# Patient Record
Sex: Male | Born: 1955 | Race: Black or African American | Hispanic: No | Marital: Single | Smoking: Current every day smoker
Health system: Southern US, Community
[De-identification: ages and names within clinical notes are randomized; demographics above are authoritative.]

## PROBLEM LIST (undated history)

## (undated) ENCOUNTER — Emergency Department (HOSPITAL_COMMUNITY): Admission: EM | Payer: Self-pay | Source: Home / Self Care

## (undated) DIAGNOSIS — F1010 Alcohol abuse, uncomplicated: Secondary | ICD-10-CM

## (undated) DIAGNOSIS — C220 Liver cell carcinoma: Secondary | ICD-10-CM

## (undated) DIAGNOSIS — I81 Portal vein thrombosis: Secondary | ICD-10-CM

## (undated) DIAGNOSIS — B1920 Unspecified viral hepatitis C without hepatic coma: Secondary | ICD-10-CM

## (undated) DIAGNOSIS — R160 Hepatomegaly, not elsewhere classified: Secondary | ICD-10-CM

## (undated) DIAGNOSIS — T07XXXA Unspecified multiple injuries, initial encounter: Secondary | ICD-10-CM

## (undated) DIAGNOSIS — D696 Thrombocytopenia, unspecified: Secondary | ICD-10-CM

## (undated) DIAGNOSIS — K7460 Unspecified cirrhosis of liver: Secondary | ICD-10-CM

## (undated) DIAGNOSIS — R251 Tremor, unspecified: Secondary | ICD-10-CM

## (undated) DIAGNOSIS — D61818 Other pancytopenia: Secondary | ICD-10-CM

## (undated) HISTORY — DX: Liver cell carcinoma: C22.0

## (undated) HISTORY — DX: Portal vein thrombosis: I81

## (undated) HISTORY — DX: Unspecified cirrhosis of liver: K74.60

## (undated) HISTORY — DX: Unspecified viral hepatitis C without hepatic coma: B19.20

## (undated) HISTORY — DX: Alcohol abuse, uncomplicated: F10.10

---

## 2004-08-31 DIAGNOSIS — K7460 Unspecified cirrhosis of liver: Secondary | ICD-10-CM

## 2004-08-31 HISTORY — DX: Unspecified cirrhosis of liver: K74.60

## 2004-12-25 ENCOUNTER — Inpatient Hospital Stay (HOSPITAL_COMMUNITY): Admission: EM | Admit: 2004-12-25 | Discharge: 2004-12-31 | Payer: Self-pay | Admitting: Emergency Medicine

## 2004-12-29 ENCOUNTER — Ambulatory Visit: Payer: Self-pay | Admitting: Internal Medicine

## 2005-05-28 ENCOUNTER — Emergency Department: Payer: Self-pay | Admitting: Unknown Physician Specialty

## 2005-11-26 ENCOUNTER — Other Ambulatory Visit: Payer: Self-pay

## 2005-11-27 ENCOUNTER — Observation Stay: Payer: Self-pay | Admitting: Internal Medicine

## 2005-12-02 ENCOUNTER — Other Ambulatory Visit: Payer: Self-pay

## 2005-12-02 ENCOUNTER — Inpatient Hospital Stay: Payer: Self-pay

## 2008-01-23 ENCOUNTER — Emergency Department (HOSPITAL_COMMUNITY): Admission: EM | Admit: 2008-01-23 | Discharge: 2008-01-24 | Payer: Self-pay | Admitting: Emergency Medicine

## 2010-08-31 DIAGNOSIS — I81 Portal vein thrombosis: Secondary | ICD-10-CM

## 2010-08-31 DIAGNOSIS — R160 Hepatomegaly, not elsewhere classified: Secondary | ICD-10-CM

## 2010-08-31 HISTORY — DX: Hepatomegaly, not elsewhere classified: R16.0

## 2010-08-31 HISTORY — DX: Portal vein thrombosis: I81

## 2010-11-06 ENCOUNTER — Emergency Department (HOSPITAL_COMMUNITY)
Admission: EM | Admit: 2010-11-06 | Discharge: 2010-11-07 | Disposition: A | Discharge status: Home / Self Care | Payer: Self-pay | Attending: Emergency Medicine | Admitting: Emergency Medicine

## 2010-11-06 DIAGNOSIS — Z8619 Personal history of other infectious and parasitic diseases: Secondary | ICD-10-CM | POA: Insufficient documentation

## 2010-11-06 DIAGNOSIS — F1010 Alcohol abuse, uncomplicated: Secondary | ICD-10-CM | POA: Insufficient documentation

## 2010-11-06 DIAGNOSIS — R259 Unspecified abnormal involuntary movements: Secondary | ICD-10-CM | POA: Insufficient documentation

## 2010-11-06 LAB — COMPREHENSIVE METABOLIC PANEL
ALT: 51 U/L (ref 0–53)
Albumin: 3.1 g/dL — ABNORMAL LOW (ref 3.5–5.2)
Alkaline Phosphatase: 76 U/L (ref 39–117)
BUN: 12 mg/dL (ref 6–23)
Chloride: 105 mEq/L (ref 96–112)
Creatinine, Ser: 0.86 mg/dL (ref 0.4–1.5)
GFR calc non Af Amer: >60 mL/min (ref >60)
Potassium: 3.7 mEq/L (ref 3.5–5.1)
Total Bilirubin: 1.4 mg/dL — ABNORMAL HIGH (ref 0.3–1.2)

## 2010-11-06 LAB — CBC
MCH: 35.5 pg — ABNORMAL HIGH (ref 26.0–34.0)
MCHC: 35.9 g/dL (ref 30.0–36.0)
MCV: 98.8 fL (ref 78.0–100.0)
Platelets: 78 10*3/uL — ABNORMAL LOW (ref 150–400)
RBC: 4.2 MIL/uL — ABNORMAL LOW (ref 4.22–5.81)
WBC: 4.4 10*3/uL (ref 4.0–10.5)

## 2010-11-06 LAB — RAPID URINE DRUG SCREEN, HOSP PERFORMED
Amphetamines: NOT DETECTED
Opiates: NOT DETECTED
Tetrahydrocannabinol: NOT DETECTED

## 2010-11-06 LAB — DIFFERENTIAL
Basophils Relative: 1 % (ref 0–1)
Eosinophils Relative: 1 % (ref 0–5)
Lymphocytes Relative: 64 % — ABNORMAL HIGH (ref 12–46)
Monocytes Absolute: 0.4 10*3/uL (ref 0.1–1.0)
Neutro Abs: 1.1 10*3/uL — ABNORMAL LOW (ref 1.7–7.7)

## 2010-11-09 ENCOUNTER — Emergency Department (HOSPITAL_COMMUNITY): Payer: Self-pay

## 2010-11-09 ENCOUNTER — Inpatient Hospital Stay (HOSPITAL_COMMUNITY): Payer: Self-pay

## 2010-11-09 ENCOUNTER — Inpatient Hospital Stay (HOSPITAL_COMMUNITY)
Admission: EM | Admit: 2010-11-09 | Discharge: 2010-11-11 | DRG: 897 | Disposition: A | Discharge status: Home / Self Care | Payer: Self-pay | Attending: Family Medicine | Admitting: Family Medicine

## 2010-11-09 DIAGNOSIS — Z9119 Patient's noncompliance with other medical treatment and regimen: Secondary | ICD-10-CM

## 2010-11-09 DIAGNOSIS — B1920 Unspecified viral hepatitis C without hepatic coma: Secondary | ICD-10-CM | POA: Diagnosis present

## 2010-11-09 DIAGNOSIS — K7030 Alcoholic cirrhosis of liver without ascites: Secondary | ICD-10-CM | POA: Diagnosis present

## 2010-11-09 DIAGNOSIS — Z91199 Patient's noncompliance with other medical treatment and regimen due to unspecified reason: Secondary | ICD-10-CM

## 2010-11-09 DIAGNOSIS — F10931 Alcohol use, unspecified with withdrawal delirium: Principal | ICD-10-CM | POA: Diagnosis present

## 2010-11-09 DIAGNOSIS — R7309 Other abnormal glucose: Secondary | ICD-10-CM | POA: Diagnosis present

## 2010-11-09 DIAGNOSIS — Z7982 Long term (current) use of aspirin: Secondary | ICD-10-CM

## 2010-11-09 DIAGNOSIS — F10231 Alcohol dependence with withdrawal delirium: Principal | ICD-10-CM | POA: Diagnosis present

## 2010-11-09 DIAGNOSIS — D696 Thrombocytopenia, unspecified: Secondary | ICD-10-CM | POA: Diagnosis present

## 2010-11-09 DIAGNOSIS — F1010 Alcohol abuse, uncomplicated: Secondary | ICD-10-CM | POA: Diagnosis present

## 2010-11-09 LAB — COMPREHENSIVE METABOLIC PANEL
AST: 121 U/L — ABNORMAL HIGH (ref 0–37)
Alkaline Phosphatase: 95 U/L (ref 39–117)
BUN: 10 mg/dL (ref 6–23)
CO2: 25 mEq/L (ref 19–32)
Chloride: 107 mEq/L (ref 96–112)
Creatinine, Ser: 0.77 mg/dL (ref 0.4–1.5)
GFR calc Af Amer: >60 mL/min (ref >60)
GFR calc non Af Amer: >60 mL/min (ref >60)
Glucose, Bld: 94 mg/dL (ref 70–99)
Total Protein: 8 g/dL (ref 6.0–8.3)

## 2010-11-09 LAB — CK TOTAL AND CKMB (NOT AT ARMC): Total CK: 54 U/L (ref 7–232)

## 2010-11-09 LAB — RAPID URINE DRUG SCREEN, HOSP PERFORMED
Amphetamines: NOT DETECTED
Cocaine: NOT DETECTED

## 2010-11-09 LAB — DIFFERENTIAL
Eosinophils Relative: 5 % (ref 0–5)
Monocytes Absolute: 0.3 10*3/uL (ref 0.1–1.0)
Neutro Abs: 0.8 10*3/uL — ABNORMAL LOW (ref 1.7–7.7)
Neutrophils Relative %: 25 % — ABNORMAL LOW (ref 43–77)

## 2010-11-09 LAB — CBC
HCT: 45.9 % (ref 39.0–52.0)
MCHC: 35.9 g/dL (ref 30.0–36.0)
WBC: 3 10*3/uL — ABNORMAL LOW (ref 4.0–10.5)

## 2010-11-09 LAB — AMMONIA: Ammonia: 90 umol/L — ABNORMAL HIGH (ref 11–35)

## 2010-11-10 LAB — BASIC METABOLIC PANEL
BUN: 11 mg/dL (ref 6–23)
CO2: 23 mEq/L (ref 19–32)
Glucose, Bld: 103 mg/dL — ABNORMAL HIGH (ref 70–99)
Potassium: 4 mEq/L (ref 3.5–5.1)
Sodium: 137 mEq/L (ref 135–145)

## 2010-11-10 LAB — CARDIAC PANEL(CRET KIN+CKTOT+MB+TROPI)
CK, MB: 1 ng/mL (ref 0.3–4.0)
Relative Index: INVALID (ref 0.0–2.5)
Total CK: 60 U/L (ref 7–232)
Total CK: 52 U/L (ref 7–232)
Troponin I: 0.03 ng/mL (ref 0.00–0.06)

## 2010-11-10 LAB — CBC
HCT: 40.3 % (ref 39.0–52.0)
MCH: 34.9 pg — ABNORMAL HIGH (ref 26.0–34.0)

## 2010-11-10 LAB — TSH: TSH: 1.431 u[IU]/mL (ref 0.350–4.500)

## 2010-11-11 LAB — CBC
HCT: 41.2 % (ref 39.0–52.0)
MCH: 35.7 pg — ABNORMAL HIGH (ref 26.0–34.0)
MCHC: 35.9 g/dL (ref 30.0–36.0)
MCV: 99.3 fL (ref 78.0–100.0)
RDW: 13.6 % (ref 11.5–15.5)

## 2010-11-11 LAB — HEMOGLOBIN A1C: Mean Plasma Glucose: 97 mg/dL (ref <117)

## 2010-11-13 NOTE — Discharge Summary (Signed)
NAMELADON, VANDENBERGHE NO.:  [id number]  MEDICAL RECORD NO.:  [id number]           PATIENT TYPE:  I  LOCATION:  2507                         FACILITY:  [facility]  PHYSICIAN:  Pleas Koch, MD        DATE OF BIRTH:  Sep 30, 1955  DATE OF ADMISSION:  11/09/2010 DATE OF DISCHARGE:  11/11/2010                              DISCHARGE SUMMARY   DISCHARGE DIAGNOSES: 1. Alcohol withdrawal. 2. Hepatitis C, plus/minus cirrhosis. 3. Thrombocytopenia and pancytopenia. 4. Impaired glucose tolerance. 5. Noncompliance medical therapy.  DISCHARGE MEDICATIONS: 1. Lactulose 30 mL t.i.d., hold for diarrhea. 2. Multivitamins 1 capsule daily.  The patient is now on home medications.  PERTINENT IMAGING STUDIES:  Chest x-ray, portable done on November 09, 2010, showed no acute cardiopulmonary process.  CT scan done on November 09, 2010, showed no intracranial hemorrhages, no evidence of large acute infarct, minimal paranasal sinus opacification as noted above.  The patient has no PCP.  The patient will need to establish [facility].  BRIEF HISTORY AND PHYSICAL:  The patient is a 55 year old man with hepatitis C, alcohol-related liver disease, alcohol abuse who presented from an outpatient detoxification center with worsening tremors and hallucinations.  In the ED, he received total of 3 mg of Ativan and somewhat somnolent, able to answer questions.  He received folic acid and thiamine in the ER.  He states last drink about 3 days ago.  He states the name of the facility he is currently at is [facility].  He was not able to give much history.  HOSPITAL COURSE: 1. According to history of alcohol withdrawal, the patient was placed     on CIWA protocol, and required minimum amount of Ativan.  He was     not having hallucinations.  He tolerated p.o. well.  He did not     have any tremulousness or heard any voices.  As such, he has     arrangements for outpatient followup [facility]     and I will allow him to go home today.  The patient was stable from     medical standpoint. 2. Hepatitis C and cirrhosis.  The patient has not had a workup for.     His HCV is unknown, what his titers are.  I did get HCV RNA which     is positive.  We are still awaiting HCV RNA quantitative and I am     not sure if we can take it from here, as it is too early to plan.     This will need to be followed up as an outpatient.  The patient     will need likely surveillance for esophageal varices given his     issues.  He also had an elevated ammonia level and this will need     to be reviewed.  The patient will need to continue on lactulose and     hold for diarrhea. 3. Thrombocytopenia and pancytopenia.  This is likely secondary to his     acute alcoholic state as well as his hepatitis.  He will need to  be     monitored for this periodically.  His platelet count ranged in the     hospital from 60s to 50s and he will need to have another CBC in     about 1-2 weeks. 4. Impaired glucose tolerance.  He had slightly high blood sugars     while in-hospital.  An A1c done and is still pending, however, the     patient will need repeat to determine further workup for this.  The     patient was discharged home in a stable state.  Temperature was 98.3, respirations were 20, pulse was 89, blood pressure 120s-130s/70s-80s.  I will contact his family to let them know of his discharge plan.          ______________________________ Pleas Koch, MD     JS/MEDQ  D:  11/11/2010  T:  11/11/2010  Job:  [id number]  Electronically Signed by Pleas Koch MD on 11/13/2010 04:51:36 PM

## 2010-11-15 NOTE — H&P (Signed)
NAME:  Jose Krueger, Jose Krueger NO.:  [id number]  MEDICAL RECORD NO.:  [id number]           PATIENT TYPE:  E  LOCATION:  [facility]                         FACILITY:  [facility]  PHYSICIAN:  Lamar Laundry, MD      DATE OF BIRTH:  Jan 06, 1956  DATE OF ADMISSION:  11/09/2010 DATE OF DISCHARGE:                             HISTORY & PHYSICAL   CHIEF COMPLAINT:  Alcohol withdrawal.  HISTORY OF PRESENT ILLNESS:  Jose Krueger is a 55 year old gentleman with a history of hepatitis C, alcohol-related liver disease, alcohol abuse who presents to the emergency room today from an outpatient detoxification center with worsening tremors and hallucinations.  In the ER, he received a total of 3 mg of p.o. Ativan and is currently somewhat somnolent at the time of my evaluation, but arousable and able to answer some questions.  He also received folic acid and thiamine in the ER.  He states his last drink was about 3 days ago.  The name of the facility he is currently at is [facility].  He is unable to give much further history at the present time.  ALLERGIES:  No known drug allergies.  MEDICATIONS:  He says he takes ibuprofen and aspirin on a p.r.n. basis.  PAST MEDICAL HISTORY:  Significant for alcohol abuse, hepatitis C, and hep C related liver disease, cocaine abuse, physiologic tremors, eczema, anxiety.  SOCIAL HISTORY:  He is participating in an alcohol rehabilitation center right now.  He does have a girlfriend who evidently discussed his situation at the ER doctor earlier, but she is not available at present to speak with.  He does have a history of cocaine abuse in the past, not clear if he is currently using drugs, but his UTOX here is negative.  FAMILY HISTORY:  The patient is quite somnolent, unable to obtain at this time.  REVIEW OF SYSTEMS:  A 10-point review of systems was unable to be obtained due to the patient's current state.  PHYSICAL EXAM:  VITAL SIGNS:  Temperature 98.1,  blood pressure 140/82, pulse 80, respirations 16, 98% on room air. GENERAL:  This is a middle-aged [place] American gentleman who is not in any current distress, currently lying in bed sleeping, but arousable. HEENT:  Normocephalic, atraumatic.  Extraocular movements are intact. Moist mucous membranes. NECK:  No jugular venous distention. CARDIOVASCULAR:  Regular rate and rhythm, S1 and S2.  No murmurs, rubs, or gallops. LUNGS:  Clear to auscultation bilaterally.  No crackles, wheezes, or rhonchi. ABDOMEN:  Soft, nontender, nondistended.  There is hepatomegaly appreciated. EXTREMITIES:  No pretibial edema. NEUROLOGIC:  Somnolent at present, but does not appear to have any focal deficits.  LABORATORY DATA:  Sodium 138, potassium 4, chloride 107, bicarb 25, BUN 10, creatinine 0.77, glucose 94.  Alk phos 121, AST 121, ALT 62, bilirubin 1.7, ammonia 90s with an upper limit normal of 35.  A UTOX is positive for only benzodiazepines, which he is likely being receiving at the outside facility, and an alcohol level less than 5.  A head CT was conducted and was negative for any acute findings.  ASSESSMENT AND PLAN:  This is a 55 year old gentleman with alcohol withdrawal and an elevated ammonia level. 1. Alcohol withdrawal.  We will place this patient on a CIWA protocol     and he will be receiving thiamine, folic acid, and multivitamin     daily in addition to p.r.n. Ativan. 2. Elevated ammonia level.  This may also be contributing to the     patient's altered mental status.  We will place him on lactulose 30     mL p.o. t.i.d. until discontinued and check an ammonia level again     tomorrow. 3. Polysubstance abuse.  We will get in contact with the social worker     regarding options for Jose Krueger in terms of discharge.  We are     currently going to treat his alcohol withdrawal.  His UTOX was     negative for other drug use at this time. 4. Prophylaxis.  We will place the patient on  subcutaneous Lovenox. 5. Code status.  The patient will be listed as a full code.     Lamar Laundry, MD     HR/MEDQ  D:  11/09/2010  T:  11/09/2010  Job:  [id number]  Electronically Signed by Lamar Laundry MD on 11/15/2010 01:34:44 PM

## 2010-12-30 HISTORY — PX: ESOPHAGOGASTRODUODENOSCOPY: SHX1529

## 2011-01-02 ENCOUNTER — Emergency Department (HOSPITAL_COMMUNITY): Payer: Self-pay

## 2011-01-02 ENCOUNTER — Inpatient Hospital Stay (INDEPENDENT_AMBULATORY_CARE_PROVIDER_SITE_OTHER)
Admission: RE | Admit: 2011-01-02 | Discharge: 2011-01-02 | Disposition: A | Discharge status: Home / Self Care | Payer: Self-pay | Source: Ambulatory Visit

## 2011-01-02 ENCOUNTER — Encounter: Payer: Self-pay | Admitting: Internal Medicine

## 2011-01-02 ENCOUNTER — Emergency Department (HOSPITAL_COMMUNITY): Admission: EM | Admit: 2011-01-02 | Payer: Self-pay | Source: Home / Self Care

## 2011-01-02 ENCOUNTER — Inpatient Hospital Stay (HOSPITAL_COMMUNITY)
Admission: EM | Admit: 2011-01-02 | Discharge: 2011-01-08 | DRG: 442 | Disposition: A | Discharge status: Home / Self Care | Payer: Self-pay | Attending: Internal Medicine | Admitting: Internal Medicine

## 2011-01-02 DIAGNOSIS — K766 Portal hypertension: Secondary | ICD-10-CM | POA: Diagnosis present

## 2011-01-02 DIAGNOSIS — M545 Low back pain, unspecified: Secondary | ICD-10-CM | POA: Diagnosis present

## 2011-01-02 DIAGNOSIS — I81 Portal vein thrombosis: Secondary | ICD-10-CM

## 2011-01-02 DIAGNOSIS — R791 Abnormal coagulation profile: Secondary | ICD-10-CM | POA: Diagnosis present

## 2011-01-02 DIAGNOSIS — R10819 Abdominal tenderness, unspecified site: Secondary | ICD-10-CM

## 2011-01-02 DIAGNOSIS — K7030 Alcoholic cirrhosis of liver without ascites: Secondary | ICD-10-CM | POA: Diagnosis present

## 2011-01-02 DIAGNOSIS — D696 Thrombocytopenia, unspecified: Secondary | ICD-10-CM | POA: Diagnosis present

## 2011-01-02 DIAGNOSIS — F17200 Nicotine dependence, unspecified, uncomplicated: Secondary | ICD-10-CM | POA: Diagnosis present

## 2011-01-02 DIAGNOSIS — B1920 Unspecified viral hepatitis C without hepatic coma: Secondary | ICD-10-CM | POA: Diagnosis present

## 2011-01-02 DIAGNOSIS — K7460 Unspecified cirrhosis of liver: Secondary | ICD-10-CM

## 2011-01-02 LAB — COMPREHENSIVE METABOLIC PANEL
ALT: 84 U/L — ABNORMAL HIGH (ref 0–53)
Alkaline Phosphatase: 67 U/L (ref 39–117)
CO2: 26 mEq/L (ref 19–32)
GFR calc non Af Amer: >60 mL/min (ref >60)
Glucose, Bld: 86 mg/dL (ref 70–99)
Potassium: 3.7 mEq/L (ref 3.5–5.1)
Sodium: 140 mEq/L (ref 135–145)

## 2011-01-02 LAB — DIFFERENTIAL
Basophils Absolute: 0.1 10*3/uL (ref 0.0–0.1)
Basophils Relative: 1 % (ref 0–1)
Eosinophils Absolute: 0.1 10*3/uL (ref 0.0–0.7)
Eosinophils Relative: 1 % (ref 0–5)
Lymphocytes Relative: 46 % (ref 12–46)
Lymphs Abs: 2.2 10*3/uL (ref 0.7–4.0)
Monocytes Absolute: 0.5 10*3/uL (ref 0.1–1.0)
Monocytes Relative: 10 % (ref 3–12)
Neutro Abs: 2.1 10*3/uL (ref 1.7–7.7)
Neutrophils Relative %: 42 % — ABNORMAL LOW (ref 43–77)

## 2011-01-02 LAB — CBC
HCT: 39.4 % (ref 39.0–52.0)
Hemoglobin: 13.7 g/dL (ref 13.0–17.0)
MCHC: 34.8 g/dL (ref 30.0–36.0)

## 2011-01-02 LAB — POCT URINALYSIS DIP (DEVICE)
Glucose, UA: NEGATIVE mg/dL
Hgb urine dipstick: NEGATIVE
Ketones, ur: NEGATIVE mg/dL
Nitrite: NEGATIVE
Specific Gravity, Urine: 1.015 (ref 1.005–1.030)

## 2011-01-02 LAB — LIPASE, BLOOD: Lipase: 36 U/L (ref 11–59)

## 2011-01-02 NOTE — H&P (Signed)
Hospital Admission Note Date: 01/02/2011  Patient name: Jose Krueger Medical record number: [id number] Date of birth: [birth year] Age: 55 y.o. Gender: male PCP: No primary provider on file.  Medical Service:  Attending physician:Dr. Phillips Odor      Resident (R2/R3):     Dr. Baltazar Apo    Pager: [phone] Resident (R1):          Dr. Dorthula Rue    Pager: [phone]  Chief Complaint: abdominal pain and  nausea   History of Present Illness: 55 y/o man resident of [facility] for 2 months with PMH significant for alcohol abuse, Hep C and Cirrhosis presented to the ER with chief complaint of abdominal pain and nausea  for couple of days.    His abdominal pain is located in the RUQ and epigastric region, intermittent in nature, describes his pain as sharp in character , 4/10 in severity, with no radiation. His abdominal pain is associated with some nausea and diarrhea. His describes his stools as loose but non bloody and non mucoid. He also reports some flu like symptoms- malaise, chills but denies any fever/cough/ vomiting/dysuria/urgency frequency. He was recently discharged from the hospital in 03/12 for alcohol withdrawal and has been staying off from alcohol in [facility] for last 2 months.   Allergies: NKDA  PAST MEDICAL HISTORY:  1. Alcohol withdrawal.   2. Hepatitis C (Viral Load-3140000- 03/12 )  3. Thrombocytopenia and pancytopenia.   4. Impaired glucose tolerance.   5. Noncompliance medical therapy.   SOCIAL HISTORY:  He is participating in an alcohol rehabilitation center right now.  He is married. He has a history of drug abuse in the past-cocaine  -  10-15 years ago, heroin abuse- 30 years ago. He quit drinking and smoking 2 months ago. Before that he used to drink about 6 pack beer/day for several months and then cut it down to 40 oz per day. He used to smoke about 1 pack per week for 20 years. 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  FAMILY HISTORY: non contributory   Review of  Systems: Pertinent items are noted in HPI.  Physical Exam: T-98.5, R-15, HR-72, BP-154/79, O2 sats- 100% on RA  General appearance: alert, cooperative, fatigued and no distress Lungs: clear to auscultation bilaterally Heart: regular rate and rhythm, S1, S2 normal, no murmur, click, rub or gallop Abdomen: soft, tender to palpation in the RUQ and epigastric region, voluntary guarding was present but no rigidity, positive bowel sounds Extremities: extremities normal, atraumatic, no cyanosis or edema Pulses: 2+ and symmetric Skin: normal , no rashes, lesions Neurologic: Grossly normal  Lab results: CBC:    Component Value Date/Time   WBC 4.9 01/02/2011 1253   HGB 13.7 01/02/2011 1253   HCT 39.4 01/02/2011 1253   PLT 89* 01/02/2011 1253   MCV 97.8 01/02/2011 1253   NEUTROABS 2.1 01/02/2011 1253   LYMPHSABS 2.2 01/02/2011 1253   MONOABS 0.5 01/02/2011 1253   EOSABS 0.1 01/02/2011 1253   BASOSABS 0.1 01/02/2011 1253   Comprehensive Metabolic Panel:    Component Value Date/Time   NA 140 01/02/2011 1253   K 3.7 01/02/2011 1253   CL 108 01/02/2011 1253   CO2 26 01/02/2011 1253   BUN 13 01/02/2011 1253   CREATININE 0.57 01/02/2011 1253   GLUCOSE 86 01/02/2011 1253   CALCIUM 9.0 01/02/2011 1253   AST 122* 01/02/2011 1253   ALT 84* 01/02/2011 1253   ALKPHOS 67 01/02/2011 1253   BILITOT 1.3* 01/02/2011  1253   PROT 6.8 01/02/2011 1253   ALBUMIN 2.5* 01/02/2011 1253     Lipase                                   36                11-59            U/L  Troponin I                               <0.30             <0.30            Ng/mL  Specific Gravity                         1.015             1.005-1.030  pH                                       7.0               5.0-8.0  Urine Glucose                            NEGATIVE          NEG              mg/dL  Bilirubin                                SMALL      a      NEG  Ketones                                  NEGATIVE          NEG              mg/dL  Blood                                     NEGATIVE          NEG  Protein                                  NEGATIVE          NEG              mg/dL  Urobilinogen                             >=8.0             0.0-1.0          mg/dL  Nitrite                                  NEGATIVE  NEG  Leukocytes                               NEGATIVE          NEG   Imaging results:  Ultrasound:  Heterogeneous and nodular liver is suggestive for cirrhosis. 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   Evidence for collateral veins or varices in the porta hepatis  Other results:  Assessment & Plan by Problem:  1. Abdominal pain: Differentials include gastritis (given the history of NSAIDS use and alcohol ) versus portal vein thrombosis versus GERD versus pancreatitis versus cholecystitis. He had abdominal ultrasound which did not show any evidence for cholecystitis but did show occluded portal vein and cirrhosis. His lipase levels were 36 which ruled out pancreatitis. This abdominal pain is most likely related to portal vein thrombosis but gastritis is also possible.  Plan: -Will check UDS, HIV given his history of substance abuse. -Will start him on heparin and warfarin, per Dr. Haywood Pao (GI) recommendations. There is concern for variceal bleeding, however given current thrombosis, will start patient on anticoagulation for goal INR 2-3 -Will treat his symptoms of pain and nausea with morphine and zofran. -Will start him on protonix -Will follow recommendations by Dr. Elnoria Howard of GI for further evaluation and EGD for screening of varices and gastritis   2. Cirrhosis: secondary to Hep C and alcohol abuse. Elevated LFTs reflect chronic alcohol use. Currently no evidence of hepatic encephalopathy. U/S consistent with cirrhosis but no ascites. Clinically patient's abdomen does not appear distended. Will continue to follow closely.   3. Thrombocytopenia: likely secondary to underlying liver disease. Plan: -Continue to monitor.  4. Hx of  Alcohol abuse - has not had any alcohol in over 2 months. Will follow closely and monitor for alcohol withdrawal.   5. Hepatitis C - now complicated by cirrhosis, with elevated viral load. Not seen by hepatologist in the past, and has never received any treatment in the past. Given degree of portal HTN, and cirrhosis, patient would not likely benefit from anti-viral therapy. MELD score not calculated as there is no INR yet.   6. VTE Prophylaxis: Heparin and coumadin

## 2011-01-03 ENCOUNTER — Inpatient Hospital Stay (HOSPITAL_COMMUNITY): Payer: Self-pay

## 2011-01-03 LAB — RAPID URINE DRUG SCREEN, HOSP PERFORMED
Benzodiazepines: NOT DETECTED
Cocaine: NOT DETECTED
Tetrahydrocannabinol: NOT DETECTED

## 2011-01-03 LAB — CBC
Platelets: 91 10*3/uL — ABNORMAL LOW (ref 150–400)
RDW: 14.4 % (ref 11.5–15.5)
WBC: 5.3 10*3/uL (ref 4.0–10.5)

## 2011-01-03 LAB — COMPREHENSIVE METABOLIC PANEL
Albumin: 2.2 g/dL — ABNORMAL LOW (ref 3.5–5.2)
BUN: 11 mg/dL (ref 6–23)
Chloride: 105 mEq/L (ref 96–112)
Creatinine, Ser: 0.61 mg/dL (ref 0.4–1.5)
Total Bilirubin: 0.8 mg/dL (ref 0.3–1.2)
Total Protein: 6.4 g/dL (ref 6.0–8.3)

## 2011-01-03 LAB — HIV ANTIBODY (ROUTINE TESTING W REFLEX): HIV: NONREACTIVE

## 2011-01-03 LAB — PROTIME-INR: INR: 1.24 (ref 0.00–1.49)

## 2011-01-03 LAB — HEPARIN LEVEL (UNFRACTIONATED): Heparin Unfractionated: 0.58 IU/mL (ref 0.30–0.70)

## 2011-01-03 MED ORDER — IOHEXOL 300 MG/ML  SOLN
100.0000 mL | Freq: Once | INTRAMUSCULAR | Status: AC | PRN
  Administered 2011-01-03: 100 mL via INTRAVENOUS

## 2011-01-04 ENCOUNTER — Inpatient Hospital Stay (HOSPITAL_COMMUNITY): Payer: Self-pay

## 2011-01-04 LAB — CBC
Hemoglobin: 12 g/dL — ABNORMAL LOW (ref 13.0–17.0)
MCHC: 35.1 g/dL (ref 30.0–36.0)
Platelets: 95 10*3/uL — ABNORMAL LOW (ref 150–400)
RBC: 3.52 MIL/uL — ABNORMAL LOW (ref 4.22–5.81)

## 2011-01-04 LAB — AFP TUMOR MARKER: AFP-Tumor Marker: 6.6 ng/mL (ref 0.0–8.0)

## 2011-01-04 LAB — BASIC METABOLIC PANEL
CO2: 26 mEq/L (ref 19–32)
Calcium: 8.6 mg/dL (ref 8.4–10.5)
GFR calc Af Amer: >60 mL/min (ref >60)
Sodium: 134 mEq/L — ABNORMAL LOW (ref 135–145)

## 2011-01-04 LAB — PROTIME-INR: INR: 2.18 — ABNORMAL HIGH (ref 0.00–1.49)

## 2011-01-04 LAB — HEPARIN LEVEL (UNFRACTIONATED): Heparin Unfractionated: 0.6 IU/mL (ref 0.30–0.70)

## 2011-01-04 MED ORDER — GADOXETATE DISODIUM 0.25 MMOL/ML IV SOLN
8.0000 mL | Freq: Once | INTRAVENOUS | Status: AC | PRN
  Administered 2011-01-04: 8 mL via INTRAVENOUS

## 2011-01-05 DIAGNOSIS — K7460 Unspecified cirrhosis of liver: Secondary | ICD-10-CM

## 2011-01-05 DIAGNOSIS — R932 Abnormal findings on diagnostic imaging of liver and biliary tract: Secondary | ICD-10-CM

## 2011-01-05 LAB — CBC
MCV: 97.4 fL (ref 78.0–100.0)
Platelets: 85 10*3/uL — ABNORMAL LOW (ref 150–400)
RBC: 3.48 MIL/uL — ABNORMAL LOW (ref 4.22–5.81)
WBC: 4.7 10*3/uL (ref 4.0–10.5)

## 2011-01-05 LAB — COMPREHENSIVE METABOLIC PANEL WITH GFR
ALT: 84 U/L — ABNORMAL HIGH (ref 0–53)
AST: 141 U/L — ABNORMAL HIGH (ref 0–37)
Albumin: 1.9 g/dL — ABNORMAL LOW (ref 3.5–5.2)
Alkaline Phosphatase: 65 U/L (ref 39–117)
BUN: 12 mg/dL (ref 6–23)
CO2: 26 meq/L (ref 19–32)
Calcium: 8.8 mg/dL (ref 8.4–10.5)
Chloride: 105 meq/L (ref 96–112)
Creatinine, Ser: 0.57 mg/dL (ref 0.4–1.5)
GFR calc non Af Amer: >60 mL/min
Glucose, Bld: 99 mg/dL (ref 70–99)
Potassium: 4.2 meq/L (ref 3.5–5.1)
Sodium: 134 meq/L — ABNORMAL LOW (ref 135–145)
Total Bilirubin: 0.5 mg/dL (ref 0.3–1.2)
Total Protein: 5.9 g/dL — ABNORMAL LOW (ref 6.0–8.3)

## 2011-01-05 LAB — PROTIME-INR
INR: 4.1 — ABNORMAL HIGH (ref 0.00–1.49)
Prothrombin Time: 39.7 s — ABNORMAL HIGH (ref 11.6–15.2)

## 2011-01-05 LAB — HEPARIN LEVEL (UNFRACTIONATED)

## 2011-01-06 DIAGNOSIS — K7460 Unspecified cirrhosis of liver: Secondary | ICD-10-CM

## 2011-01-06 DIAGNOSIS — I81 Portal vein thrombosis: Secondary | ICD-10-CM

## 2011-01-06 DIAGNOSIS — R932 Abnormal findings on diagnostic imaging of liver and biliary tract: Secondary | ICD-10-CM

## 2011-01-06 LAB — CBC
HCT: 35.8 % — ABNORMAL LOW (ref 39.0–52.0)
Hemoglobin: 12.2 g/dL — ABNORMAL LOW (ref 13.0–17.0)
MCH: 33.2 pg (ref 26.0–34.0)
MCHC: 34.1 g/dL (ref 30.0–36.0)
RBC: 3.68 MIL/uL — ABNORMAL LOW (ref 4.22–5.81)

## 2011-01-07 ENCOUNTER — Inpatient Hospital Stay (HOSPITAL_COMMUNITY): Payer: Self-pay

## 2011-01-07 ENCOUNTER — Other Ambulatory Visit (HOSPITAL_COMMUNITY): Payer: Self-pay

## 2011-01-07 LAB — BASIC METABOLIC PANEL
BUN: 9 mg/dL (ref 6–23)
Chloride: 103 mEq/L (ref 96–112)
GFR calc non Af Amer: >60 mL/min (ref >60)
Glucose, Bld: 84 mg/dL (ref 70–99)
Potassium: 4.5 mEq/L (ref 3.5–5.1)
Sodium: 135 mEq/L (ref 135–145)

## 2011-01-07 LAB — CBC
HCT: 35.2 % — ABNORMAL LOW (ref 39.0–52.0)
Hemoglobin: 12 g/dL — ABNORMAL LOW (ref 13.0–17.0)
MCH: 33.2 pg (ref 26.0–34.0)
MCHC: 34.1 g/dL (ref 30.0–36.0)
RBC: 3.61 MIL/uL — ABNORMAL LOW (ref 4.22–5.81)

## 2011-01-07 LAB — PROTIME-INR: INR: 1.53 — ABNORMAL HIGH (ref 0.00–1.49)

## 2011-01-08 DIAGNOSIS — I81 Portal vein thrombosis: Secondary | ICD-10-CM

## 2011-01-08 DIAGNOSIS — K7460 Unspecified cirrhosis of liver: Secondary | ICD-10-CM

## 2011-01-08 LAB — CBC
HCT: 34.6 % — ABNORMAL LOW (ref 39.0–52.0)
Hemoglobin: 11.9 g/dL — ABNORMAL LOW (ref 13.0–17.0)
MCV: 97.7 fL (ref 78.0–100.0)
RDW: 14.2 % (ref 11.5–15.5)
WBC: 3.6 10*3/uL — ABNORMAL LOW (ref 4.0–10.5)

## 2011-01-08 LAB — PROTIME-INR: INR: 1.31 (ref 0.00–1.49)

## 2011-01-08 LAB — HCV RNA QUANT RFLX ULTRA OR GENOTYP: HCV Quantitative Log: 6.18 {Log} — ABNORMAL HIGH (ref <1.63)

## 2011-01-14 NOTE — Progress Notes (Signed)
Quick Note:  The following results are being followed and managed in the inpatient setting. ______

## 2011-01-16 NOTE — Consult Note (Signed)
Jose Krueger, Jose Krueger               ACCOUNT NO.:  [id number]   MEDICAL RECORD NO.:  [id number]          PATIENT TYPE:  INP   LOCATION:  A215                          FACILITY:  [facility]   PHYSICIAN:  Kofi A. Gerilyn Pilgrim, M.D. DATE OF BIRTH:  28-Feb-1956   DATE OF CONSULTATION:  12/29/2004  DATE OF DISCHARGE:                                   CONSULTATION   IMPRESSION:  Exaggerated physiological tremor due to multiple processes  including hepatic disease/elevated ammonia level, cocaine, alcoholism and  probably underlying anxiety disorder.   RECOMMENDATIONS:  Low-dose clonazepam 5 mg b.i.d.  May want to start off  with q. day dosing, however.   HISTORY:  This is a 55 year old African-American man who apparently has had  a 1-year history of tremors.  He indicates that alcohol may have helped it  in the past, but recently alcohol did not make a difference with his  tremors; in fact, it may have made it worse, especially when he was using  cocaine.  The patient indicates that when he started using alcohol and  cocaine, he developed hallucinations.  These were visual hallucinations.  He  apparently was initially evaluated and sent to [facility] because  of psychosis and suicidal ideation; however, he was sent to this hospital,  as the patient is noted to have marked tremors, and was thought to be in  delirium tremens.  The patient does not report a family history of tremors.  Again, he states that most of the time alcohol seemed not to make a  difference in the tremors.  The patient's hallucinations have improved since  his being hospitalized.  His tremor was apparently quite severe on initial  presentation to the hospital, but has improved.  He has been given Ativan  for tremors.  His alcohol level was 128 on admission.  The patient has been  found to have elevated liver enzymes, elevated ammonia level to 100, and  positive hepatitis profile/titers.  He is currently seeing GI.   Platelets  have been low at 92.   PAST MEDICAL HISTORY:  As in the history of present illness.   ALLERGIES:  None known.   FAMILY HISTORY:  Significant for apparently a brother who died from  complications of AIDS.   REVIEW OF SYSTEMS:  Unremarkable, other than as in the history of present  illness.   PHYSICAL EXAMINATION:  VITAL SIGNS:  Temperature 98.8, pulse 69, blood  pressure 133/90, respirations 20.  HEENT:  Unremarkable.  LUNGS:  Clear to auscultation bilaterally.  CARDIOVASCULAR:  Normal S1 and S2.  ABDOMEN:  Left upper quadrant pain to palpation.  SKIN:  A red macular rash that has been there for several days.  He  apparently saw a dermatologist, who said it would get better.  MENTATION:  The patient is awake, alert, and converses well.  Speech is  normal.  He is lucid.  Cognition and language are unremarkable.  NEUROLOGIC:  Cranial nerves evaluation - pupils equal, round and reactive to  light and accommodation.  Extraocular movements were intact.  Face muscles  are symmetric.  Visual fields are full.  Tongue is midline.  Uvula is  midline. Shoulder shrug normal. Motor examination - normal tone, bulk, and  strength.  Coordination - postural tremors.  The character is high  frequency, low amplitude.  There is no rigidity noted.  No bradykinesia.  Fine finger movements are normal.  No cogwheeling is noted, even with  augmentation. No dysmetria is noted.  Reflexes are symmetric and normal.  Toes are both downgoing.  Station and gait normal.   LABORATORY DATA:  WBC 5, hemoglobin 13, platelet count of 192.  INR 1.2.  Sodium 128, potassium 3.8, chloride 98, BUN 6, creatinine 0.7, bilirubin  4.6, alkaline phosphatase 93, AST 413, ALT 209, calcium 8.6.  HIV  nonreactive.  Cocaine positive.  Alcohol 126.  Hepatitis C positive.   Thanks for this consultation.      KAD/MEDQ  D:  12/29/2004  T:  12/29/2004  Job:  [id number]

## 2011-01-16 NOTE — Discharge Summary (Signed)
NAMEHRISTOPHER, MISSILDINE               ACCOUNT NO.:  [id number]   MEDICAL RECORD NO.:  [id number]          PATIENT TYPE:  INP   LOCATION:  A215                          FACILITY:  [facility]   PHYSICIAN:  Osvaldo Shipper, MD     DATE OF BIRTH:  [birth year]   DATE OF ADMISSION:  12/24/2004  DATE OF DISCHARGE:  05/03/2006LH                                 DISCHARGE SUMMARY   DISCHARGE DIAGNOSES:  1.  Hepatitis C.  2.  Alcoholic liver disease.  3.  Alcohol abuse.  4.  Cocaine abuse.  5.  Physiological tremors.  6.  Eczema with pityriasis rosea.  7.  Anxiety disorder.   Please see H&P dictated on April 27 for details of patient's presenting  illness.   BRIEF HOSPITAL COURSE:  #1.  ELEVATED LIVER ENZYMES:  The patient presented to the hospital having  active hallucinations and tremors.  The patient has a known history of  alcohol and drug abuse, and it was felt the patient might be having delirium  tremens.  Subsequently the patient was admitted to telemetry and monitored.  Initial lab work showed AST and ALT to be moderately elevated with AST more  than ALT.  The patient also had elevated total bilirubin.  Ordered hepatitis  profile, ultrasound, and workup for hepatitis.  Hepatitis profile came back  positive for hepatitis C.  Obtained HCV RNA titer which was also  significantly elevated.  An HCV genotype will be sent today, and results  should be obtained in about one to two weeks.  As part of initial testing,  we also ordered ANA which also came back positive at 1 to 160.  This is a  patient with hepatitis C and in about 5% of such individuals ANA is  positive; however, we have ordered further workup including antibodies to SS  DNA, dsDNA, ASMA, ANCA, and SPEP.  The results of these tests are not  available at this time.  The patient was seen by gastroenterology service  here in the hospital and provided input regarding patient's management.  The  patient also had an ultrasound of the liver  as well as Doppler study which  showed evidence for cirrhosis and portal hypertension.  The patient would  benefit from an EGD to look for esophageal paresis.  However, there is a  specialty clinic in [place] run by Dr. Foy Guadalajara who specializes in hepatitis  C management.  We will set up appointment for the patient to see him in his  clinic in about two to three weeks, and it is thought that they can arrange  for the EGD to be done at their clinic.  The patient will also need to  discuss treatment options with the specialist for hepatitis C and followup  on immunological studies.  Patient was also found to have elevated ammonia  level and was started on lactulose with good result.   #2.  TREMORS:  The patient was found to have significant tremors, morbid  posture.  This was of both upper and lower extremities.  The patient  mentioned that these tremors have been  ongoing for about more than a year.  He mentioned that alcohol seemed to help these tremors.  Since the patient  had significant tremors, Dr. Gerilyn Pilgrim was consulted, and he agreed that  these were postural tremors probably exacerbated by his underlying process.  The patient was started on Klonopin with which these tremors seem to have  improved.  The patient is able to ambulate with no difficulty.   #3.  ALCOHOL ABUSE:  The patient was consoled regarding alcohol cessation  and has been told that, if he continues to drink, he is going to harm his  liver more.  An outpatient appointment has been made for him to f/u with  [facility]   #4.  COCAINE ABUSE:  The patient was found to have positive urine toxicity  screen for cocaine.  He was counseled not to use cocaine again and told of  potential harm.   #5.  PSYCHIATRIC ISSUES:  At the time of admission, the patient was having  active hallucinations and was having active suicidal ideation.  However, at  this time the patient has no more hallucinations, denying  any suicidal or  homicidal ideations.  He does complain of some anxiety.  Since [facility] had seen  him at the time of admission, and they were going to transfer the patient, I  had them reevaluate him for the same. They agreed that he no longer needed  to be committed. The patient will be prescribed some Ativan for anxiety and  also seen by mental health as outpatient.   #6.  SKIN RASH:  The patient was found to have macular erythematous rash  over his upper torso as well as his upper extremities.  He was also found to  have eczematous lesions on his lower extremities.  These were consistent  with eczema and pityriasis rosea as per Dr. Margo Aye, who is a dermatologist.  The patient was prescribed triamcinolone for the rash, and he will be asked  to take cooler showers and use Dove soap.  An RPR has been sent for this  patient to screen for syphilis which also presents with a rash, although  this is not very typical of late syphilis kind of rash.   Today, May 3, the patient's vital signs are stable.  His labs are also all  stable, and he is considered okay to be released from acute medical care.   DISCHARGE MEDICATIONS:  1.  Klonopin 0.5 mg p.o. daily.  2.  Hydrochlorothiazide 12.5 mg p.o. daily.  3.  Folic acid 1 mg p.o. daily.  4.  Lactulose 30 mL p.o. daily.  5.  Multivitamins 1 tablet p.o. daily.  6.  Thiamine 100 mg p.o. daily.  7.  Omeprazole 20 mg p.o. daily.  8.  Triamcinolone cream 0.5% topically b.i.d. to affected area.  9.  Ativan 0.5 mg p.o. q.h.s. p.r.n. anxiety; 30 tablets have been      prescribed.   FOLLOW UP:  1.  With Dr. Foy Guadalajara, who runs the hepatitis clinic in [place], in two to      three weeks.  2.  I offered patient primary care doctor set up here in [place];      however, the patient lives in [place], and he said he will arrange for      the same.  3.  [facility] on May 9th 2006.   DIET:  The patient is to have a low-protein diet.    ACTIVITY:  There are  no restrictions.      GK/MEDQ  D:  12/31/2004  T:  12/31/2004  Job:  [id number]   cc:   R. Roetta Sessions, M.D.  [address]  [place]  [place] [address]   Kofi A. Gerilyn Pilgrim, M.D.  [address] [place]  [place]  [place] [address]  Fax: [phone]

## 2011-01-16 NOTE — H&P (Signed)
NAMEROHAAN, DURNIL               ACCOUNT NO.:  [id number]   MEDICAL RECORD NO.:  [id number]          PATIENT TYPE:  INP   LOCATION:  A203                          FACILITY:  [facility]   PHYSICIAN:  Calvert Cantor, M.D.     DATE OF BIRTH:  04-29-1956   DATE OF ADMISSION:  12/24/2004  DATE OF DISCHARGE:  LH                                HISTORY & PHYSICAL   PRIMARY CARE PHYSICIAN:  The patient has no primary care physician.   PRESENTING COMPLAINT:  Hallucinations.   HISTORY OF PRESENT ILLNESS:  This is a 55 year old African American male  with a history of alcohol and drug abuse who states he has been seeing  people who are not there for the past 2 days; therefore, he decided to come  into the hospital.  While in the ER, he was again having hallucinations.  He  was evaluated by the [facility] team and was found to be psychotic and suicidal,  and he was going to be transferred to [facility]; however, the patient  began to have tremors, and it was then decided that he should be admitted to  [facility] to help him withdraw from alcohol as he was most likely having  DTs.  During questioning, the patient is shaking.  When asked about his  tremors, he states that he shakes all of the time, even while he is drinking  alcohol.  He does not complain of any fevers or chills, nausea, or vomiting.  He states that he has been having abdominal pain on and off for the past few  weeks now.  He says his pain improves when he eats; however, it usually  returns, especially while he is drinking alcohol.  The pain does not radiate  to his back.  It is located in the center of the abdomen and is aching in  nature.  It currently is about a 5/10, and it was relieved by medications  that were given to him in the ER.  He does not complain of shortness of  breath.  He has not been having any diarrhea.  No complaints of dysuria.  He  states that he did attempt to stop smoking about 4 to 5 years ago where he  was in a  facility known at [facility] in [place].  He was admitted  there for 30 days, and he did stop drinking for a little while, but he  eventually went back to it.  He uses cocaine once a week or sometimes every  2 weeks.  He rarely uses marijuana.  He prefers to drink vodka, and he  states that he drinks all day and all night.  Recently, he just lost his job  because he was found to have alcohol in his coat pocket.   ALLERGIES:  No known drug allergies.   PAST MEDICAL HISTORY:  No significant medical history.   PAST SURGICAL HISTORY:  No significant surgical history.   SOCIAL HISTORY:  He smokes about 1 pack per week for the past 30 to 35 years  now.  He is unmarried.  He  has 3 children who are healthy.  He was working  as a Special educational needs teacher, but, as mentioned above, he recently lost his job, and  since then he has been drinking more frequently.   FAMILY HISTORY:  His mother has passed away.  She had a history of breast  cancer.  One brother died of AIDS.  His father's health is unknown.   The patient states that he recently had an HIV test done; however, he does  not know what the results were.   PHYSICAL EXAMINATION:  VITAL SIGNS:  Temperature was initially 100.4 degrees  in the ER; however, through the night it has remained normal.  Blood  pressure was 145/73.  The most recent blood pressure was 138/80.  Pulse is  85.  Respiratory rate is 20.  GENERAL:  The patient is awake and alert and seems to be oriented, lying in  bed, and appears comfortable.  HEENT:  Normocephalic and atraumatic.  Pupils equal, round, react to light  and accommodation.  Extraocular muscles intact.  There is no nystagmus and  no jaundice.  Oral mucosa is moist.  NECK:  Supple.  There is no lymphadenopathy or JVD.  HEART:  Regular rate and rhythm.  No murmurs.  LUNGS:  Clear.  ABDOMEN:  Soft.  There is tenderness in the epigastrium and around the  umbilical area.  It is not distended.  Bowel sounds are  positive.  EXTREMITIES:  No clubbing, cyanosis, or edema.  SKIN:  The patient has a rash on his arms, back, and upper torso which he  states is not pruritic, except after he has taken a hot shower, he states it  is itchy.  He uses hydrocortisone for this rash.  It has been there for  about 1 month now.   LABORATORY DATA:  White count is 5.8, hemoglobin 15.1, hematocrit 42.9, MCV  95.8, platelets 114.  Sodium 139, potassium 3.9, chloride 103, bicarbonate  22, glucose 94, BUN 10, creatinine 0.8, calcium 9.1.  Urine drug screen is  positive for cocaine which he states he used last night.  Alcohol level was  initially 273 and currently is 126.   ASSESSMENT/PLAN:  This is a 55 year old African American male who has been  hallucinating lately, although he has not stopped drinking; therefore, I  doubt his hallucinations are secondary to delirium tremens.  He has tremors  currently; however, he states that he is always tremulous.  Again,  therefore, I doubt these are secondary to delirium tremens.  Blood pressure  is slightly elevated.  Heart rate is within normal limits.  Most likely, the  patient will withdraw from alcohol as he is a heavy drinker.  Therefore, he  is going to be admitted and continued on an Ativan protocol until he is  completely withdrawn and afterwards will be reassessed by the [facility] team and  most likely transferred to [facility].  I will obtain an HIV test as the  patient is at risk for HIV.  In addition, the blood work will be monitored.  There were no LFTs done which I will order.   His abdominal pain seems to be secondary to gastritis.  I am going to start  Protonix and see if it helps improve his pain.  Hemoglobin will also be  monitored for a gastrointestinal bleed.   Will ensure that he does not have any gastrointestinal bleed.   Beta blockers will be avoided as he has recently used cocaine.  SR/MEDQ  D:  12/25/2004  T:  12/25/2004  Job:  [id number]

## 2011-01-16 NOTE — Consult Note (Signed)
NAMEDUVALL, COMES               ACCOUNT NO.:  [id number]   MEDICAL RECORD NO.:  [id number]          PATIENT TYPE:  INP   LOCATION:  A215                          FACILITY:  [facility]   PHYSICIAN:  Jose Krueger, M.D. DATE OF BIRTH:  02/28/1956   DATE OF CONSULTATION:  DATE OF DISCHARGE:                                   CONSULTATION   REQUESTING PHYSICIAN:  Dr. Rito Krueger.   REASON FOR CONSULTATION:  Elevated AST and ALT, questionable varices.   HISTORY OF PRESENT ILLNESS:  Jose Krueger is a 55 year old African-American  male who was admitted on December 25, 2004, secondary to hallucinations.  He  has chronic alcohol and substance abuse and was felt to have delirium  tremens.  He reports a daily intake of about 1.5 L of alcohol, usually  vodka, for the last six months.  Prior to this he was drinking about a pint  a day for the last 15-20 years.  He reports the night before he was admitted  he had smoked crack.  He began to have visual and auditory hallucinations.  Since being admitted, he has had IV fluid rehydration via central line and  has had no further hallucinations.  He is also on standard alcohol  withdrawal protocol.  He reports a history of IV drug use in the past, none  recently.  He denies any history of hepatitis, blood transfusions, tattoos.  He denies any history of yellow jaundice.  He has had multiple lifetime  sexual partners.  He denies any nausea or vomiting.  Denies any history of  elevated liver function tests in the past.  Denies ever having received  hepatitis B vaccine.  He denies any abdominal pain.   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  This was followed by ultrasound of liver with Dopplers, which revealed a  coarsened hepatic architecture without focal lesions.  There was no evidence  of gallstones, biliary dilatation, splenomegaly or ascites.   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  Hepatitis B surface antigen was negative.  A2V  antibody was positive and HIV was negative.  Hepatitis A __________ antibody  was negative, as was hepatitis B surface antibody.   PAST MEDICAL HISTORY:  He denies any past surgical history.   He denies any medications, occasional Tylenol or ibuprofen and unknown  painkiller, which he takes rarely.   ALLERGIES:  No known drug allergies.   FAMILY HISTORY:  Mother deceased at age 29 secondary to breast carcinoma.  One brother deceased secondary to AIDS.  He has two brothers and two sisters  alive and healthy.  He does not know his father's history.  There is no  known family history of liver disease per his report.   SOCIAL HISTORY:  Jose Krueger is homeless and separated from his wife.  He  does have a girlfriend.  He is staying with various friends at this time.  He  reports a 35 pack-year tobacco use history.  He has three healthy  children.  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   REVIEW OF SYSTEMS:  CONSTITUTIONAL:  Weight is stable.  Denies anorexia.  Denies any fatigue.  PSYCHOSOCIAL:  Has been having paranoia, auditory and  visual hallucinations.  Has been evaluated by [facility] team here at the hospital  and is set for hospitalization when cleared medically.  GASTROINTESTINAL:  Has daily bowel movements.  No rectal bleeding or melena.  Denies  hematemesis.  Denies any dysphagia or odynophagia.  NEUROLOGIC:  Denies any  headache.  Does have occasional diplopia and blurred vision.   PHYSICAL EXAMINATION:  VITAL SIGNS:  Weight 177.3 pounds, height 68 inches,  temperature 98.7, blood pressure 142/79, pulse 63, respirations 20.  GENERAL:  Jose Krueger is a 55 year old African-American male who is alert  and cooperative, in no acute distress.  HEENT:  Eyes clear,  nonicteric.  Conjunctivae pink.  Oropharynx pink and  moist without any lesions.  NECK:  Supple without mass or thyromegaly.  CHEST:  Heart regular rate and rhythm, normal S1, S2, without murmurs,  clicks, thrills or gallops.  He does have a right subclavian central line  intact with a clear site.  Lungs clear to auscultation bilaterally.  ABDOMEN:  Positive bowel sounds x4, no bruits auscultated.  Soft, nontender,  nondistended, without any palpable mass.  Liver is palpable at the right  costal margin.  There is no splenomegaly and no rebound tenderness or  guarding.  RECTAL:  Deferred.  EXTREMITIES:  2+ pedal pulses bilaterally.  SKIN:  Brown, warm and dry without any rash or jaundice.  NEUROLOGIC:  Grossly intact.   LABORATORY DATA:  On admission, ETOH 126.  Drug screen is positive for  cocaine.  WBC is 5.1, hemoglobin 13.8, hematocrit 39.5, platelets 92.  ESR  40.  PT 14.6, INR 1.2, PTT 30.  Calcium 8.6.  Sodium 129, potassium 3.6,  chloride 98, CO2 21, BUN 6, creatinine 0.7, glucose 83.  Alkaline  phosphatase 93, AST 413, ALT 209, total bilirubin 4.5, which is 50% direct,  total protein 7.3, albumin 2.7.  Ammonia 31.   IMPRESSION:  Jose Krueger is a 55 year old African-American male with chronic  alcohol and substance abuse history with cirrhosis, AST twice ALT values,  with elevated bilirubin.  Suggest alcohol-induced hepatitis.  Also has  positive HCV antibody and risk factors for hepatitis C to include substance  abuse with IV drugs and multiple sexual partners in the past.  Abdominal  ultrasound revealed coarse hepatic architecture without discrete lesions and  portal venous hypertension.  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  Thrombocytopenia with platelets of 92 with PT of 14.6 and INR 1.2, ferritin is 886, but I suspect this is acute-phase  reactant.    RECOMMENDATIONS:  Will obtain HCV RNA, quantitative PCR if positive  genotype.  I discussed alcohol cessation and drug use cessation at length  with the patient, and he is going to need hepatitis A and B vaccines at a  later date.  Would consider esophagogastroduodenoscopy for questionable  esophageal varices at a later date, and this has been discussed with Dr.  Jena Gauss.   We would like to thank Dr. Rito Krueger for allowing [place] to participate in the  care of Jose Krueger.  KC/MEDQ  D:  12/29/2004  T:  12/30/2004  Job:  [id number]

## 2011-01-19 NOTE — Consult Note (Signed)
NAMEHAYK, DIVIS NO.:  [id number]  MEDICAL RECORD NO.:  [id number]           PATIENT TYPE:  LOCATION:                                 FACILITY:  PHYSICIAN:  Jordan Hawks. Elnoria Howard, MD    DATE OF BIRTH:  March 15, 1956  DATE OF CONSULTATION:  01/02/2011 DATE OF DISCHARGE:                                CONSULTATION   This is an unassigned triad hospitalist patient.  REASON FOR CONSULTATION:  Portal vein thrombosis.  HISTORY OF PRESENT ILLNESS:  This is a 55 year old gentleman with past medical history of alcohol abuse, hepatitis C with cirrhosis who was admitted to the hospital with complaints of abdominal pain.  The patient states that his pain started approximately 1-2 days ago.  He thought he was having the flu and wanted to be evaluated further and subsequently his workup revealed that he has a portal vein thrombosis.  The patient has been in [facility] recovering from his alcoholism.  His last drink was approximately 2 months ago.  At that time, he was admitted to the hospital for alcohol withdrawal and subsequently he has maintained sobriety since that time.  The patient denies having any types of coagulation disorders in the past and no prior history of this type of abdominal pain.  As a result of his current findings, a GI consultation was requested for further evaluation and treatment.  PAST MEDICAL HISTORY AND PAST SURGICAL HISTORY:  As stated above.  FAMILY HISTORY:  Noncontributory.  SOCIAL HISTORY:  Significant for prior alcohol and cocaine abuse.  REVIEW OF SYSTEMS:  As stated above in history of present illness, otherwise negative.  PHYSICAL EXAMINATION:  VITAL SIGNS:  Blood pressure is 112/64, heart rate 72, respirations 20, and temperature is 98.3. GENERAL:  The patient is in no acute distress, alert and oriented. HEENT:  Normocephalic and atraumatic.  Extraocular muscles intact. NECK:  Supple.  No lymphadenopathy. LUNGS:  Clear to  auscultation bilaterally. CARDIOVASCULAR:  Regular rate and rhythm. ABDOMEN:  Moderately obese, soft, mild tenderness in the right upper quadrant.  No rebound or rigidity.  Positive bowel sounds.  EXTREMITIES: No clubbing, cyanosis, or edema.  LABORATORY VALUES:  White blood cell count is 5.3, hemoglobin 12.2, MCV is 98.1, and platelets at 91.  INR is 1.2.  Total bilirubin 1.3, alk phos 67, AST 122, ALT is 84, and albumin is 2.5.  IMPRESSION: 1. Probable acute or recent portal vein thrombosis. 2. Hepatitis C and alcoholic cirrhosis.  I have reviewed the patient's     ultrasound from yesterday and also one that was performed in 2006.     The one that was performed in 2006 has revealed that his main     portal vein was small in size but at that point it was patent.  It     was also mentioned at that time that his liver had some coarse     echoed texture, but it was uncertain if there was any evidence of     cirrhosis at that time.  His current imaging definitely suggest     that this is an issue  that it is his cirrhosis.  I am uncertain     about the reason his portal vein has acutely thrombosed.     Ultrasound does not reveal any types of masses.  With regards to     the patient's current condition, I feel that anticoagulation is     required with his acute or recent onset of portal vein thrombosis.     The current ultrasound does suggest some type of recanalization     which could indicate that this has been a longer-term process.  PLAN: 1. Continue with anticoagulation. 2. I will perform an EGD tomorrow to screen for any types of varices     as this may risk stratify the patient for any types of variceal     bleed issues with his anticoagulation. 3. Perform a CT scan of the abdomen and pelvis to ensure that there is     no malignant process that could have caused this portal vein     thrombosis.  The patient does have hepatitis C and alcoholic     cirrhosis and that does  predispose the patient to hepatocellular     carcinoma.     Jordan Hawks Elnoria Howard, MD     PDH/MEDQ  D:  01/03/2011  T:  01/03/2011  Job:  [id number]  Electronically Signed by Jeani Hawking MD on 01/19/2011 06:49:00 AM

## 2011-01-21 NOTE — Discharge Summary (Signed)
Jose Krueger, SIEVER NO.:  [id number]  MEDICAL RECORD NO.:  [id number]           PATIENT TYPE:  I  LOCATION:  5503                         FACILITY:  [facility]  PHYSICIAN:  Jose Krueger, D.O.DATE OF BIRTH:  1956-04-13  DATE OF ADMISSION:  01/02/2011 DATE OF DISCHARGE:  01/08/2011                              DISCHARGE SUMMARY   DISCHARGE DIAGNOSES: 1. Portal vein thrombosis secondary to portal hypertension. 2. Liver cirrhosis secondary to chronic alcohol abuse and hepatitis C. 3. Hepatic nodules likely hepatocellular carcinoma.  No biopsy done     due to size and location of nodules. 4. Hepatitis C with viral load greater than 1,000,000 copies. 5. Thrombocytopenia secondary to cirrhosis. 6. History of alcohol abuse. 7. Small mucosal esophageal varices. 8. Low back pain.  DISCHARGE MEDICATIONS: 1. Oxycodone 5 mg tablet, take one tablet by mouth every 4 hours as     needed for pain. 2. Famotidine 20 mg taken orally once daily.  CONSULTATIONS:  Dr. Jeani Krueger and Dr. Arlyce Krueger of [facility].  PROCEDURES: 1. The patient had a ultrasound of his abdomen done on Jan 02, 2011,     which showed heterogeneous and nodule liver suggestive for     cirrhosis, portal vein appears to be occluded, evidence for     collateral veins or varices in the porta hepatis. 2. CT of abdomen and pelvis which showed hepatic cirrhosis and a 1.7     cm hypervascular mass in the superior right hepatic lobe and a 9-mm     hypervascular mass in the lateral segment of the left hepatic lobe,     large esophageal varices consistent with portal venous     hypertension. 3. MRI of the liver, which showed two liver lesions and question have     MR imaging highly suspicious for hepatocellular carcinoma.  DISPOSITION AND FOLLOWUP:  The patient was discharged to his home in stable condition and is to follow up with Dr. Elyse Krueger of the [facility] on February 09, 2011 at 1:15  p.m.  At this appointment, Dr. Dorthula Krueger is to schedule an MRI to follow up the hepatic nodules as they were highly suspicious for hepatocellular carcinoma. Dr. Dorthula Krueger is also to follow up on the patient's recent application with disability.  HISTORY OF PRESENT ILLNESS:  Jose Krueger is a 55 year old man with past medical history significant for alcohol abuse, hepatitis C, and cirrhosis who presented to the emergency department with a chief complaint of abdominal pain and nausea for a couple of days prior to admission.  Of note, Jose Krueger had resided at [facility] which is a detoxification program for alcoholics.  His abdominal pain was located in the right upper quadrant, intermittent in nature and described as sharp in character, it was 4/10 in severity with no radiation.  His abdominal pain was associated with some nausea and diarrhea.  He described his stools are bleed, but nonbloody or non-mucoid.  He also reports some flu-like symptoms such as malaise, chills, but denied any fever, cough, vomiting, dysuria, urgency or frequency.  He was recently discharged from [facility] in March 2012 for alcohol withdrawal and has been off of alcohol for the left 2 months.  PHYSICAL EXAMINATION:  VITAL SIGNS:  On admission, temperature 98.5, respirations 15, heart rate 72, blood pressure 154/79, O2 sat of 100% on room air. GENERAL:  The patient was alert and cooperative, fatigue and in no distress. LUNGS:  Clear to auscultation bilaterally. HEART:  Regular rate and rhythm.  No murmurs, gallops, or rubs. ABDOMEN:  Soft, tender to palpation in the right upper quadrant and epigastric region.  There was voluntary guarding present, but no rigidity.  Positive bowel sounds. EXTREMITIES:  Normal atraumatic.  No edema.  Pulses were 2+ and symmetric. NEUROLOGIC:  Grossly normal.  INITIAL LABORATORY DATA:  The patient had a WBC of 4.9, hemoglobin 13.7, hematocrit 39.4, platelets of 89.   BMET with a sodium of 140, potassium 3.7, chloride 108, bicarb 26, BUN 13, creatinine 0.57, glucose of 86. Calcium 9.0, AST 122, ALT 84, alk phos of 67, total bilirubin 1.3, protein of 6.8, and albumin of 2.5.  Lipase of 36 and point-of-care cardiac markers negative.  HOSPITAL COURSE: 1. Portal vein thrombosis.  The patient initially presented with     abdominal pain and an ultrasound was done, which showed the portal     vein to be occluded secondary to portal venous hypertension.     Gastroenterology was consulted promptly and it was felt that the     portal vein had acutely thrombosed.  He was started on     anticoagulation initially with Coumadin and Lovenox given the     acuity of the portal vein thrombosis.  An EGD was done the     following day, which showed small varices.  Per GI, the patient did     not need to be started on beta-blockers prophylactically at the     varices were small at this point.  He also underwent a CT and MRI,     which showed two small hepatic nodules that were most likely     consistent with hepatocellular carcinoma.  Given the size and     location of the nodules, a biopsy was not done and the plan was for     the patient to repeat the MRI in 6 weeks to review these nodule.     Anticoagulation was stopped as the patient's INR was     supratherapeutic within 1 day of hospitalization.  Due to the     supratherapeutic INR levels, it was decided that the patient would     not be a good Coumadin candidate. 2. Hepatitis C with a viral load greater than 1,000,000.  The patient     was not started on any interferon or ribavirin therapy due to the     advanced progression of the cirrhosis. 3. Low back pain.  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  He was started on     oxycodone for pain relief, which seemed to control his pain     symptoms well.  Of note, the patient cannot receive any Tylenol      medications due to his chronic liver problems. 4. Alcohol abuse.  The patient has a history of heavy alcohol use with     a recent admission in March 2012 for alcohol withdrawal.  The     patient was discharged to [facility], which is likely a  detoxification program.  The patient states that he has been also     off of alcohol for 2 months since living at the [facility].  Due     to the nature of heavy lifting and work done at [employer], the     patient and his wife decided that it was best for the patient to     return home.  He was strongly encouraged and advised to stay off of     alcohol.  He was also told that he could be considered for a liver     transplant if he stayed off of alcohol for at least 1 year. 5. Small mucosal varices.  The patient had an EGD, which showed small     mucosal varices; however, the CT showed large varices.  The     discrepancy is because the varices that are seen on the CT scan do     not extent to the mucosa and the beta-blocker therapy that is for     prophylaxis of varices is for both that are only small.  Given that     the patient's varices were small in nature, he was not started on     any beta-blocker prophylactic therapy. 6. Thrombocytopenia, this is secondary to cirrhosis.  The patient's     baseline platelet count ranges between 80-90.  Given his     supratherapeutic INR and dropping platelet count, it was determined     that the patient would not be a good Coumadin candidate and thus,     his anticoagulation was stopped.  His platelet count was remained     stable between 80s and 90s.  VITAL SIGNS ON DAY OF DISCHARGE:  Temperature 98, pulse 69, respirations 20, blood pressure 105/62, O2 sat of 96% on room air.  LABORATORY DATA ON DAY OF DISCHARGE:  INR 1.31.  CBC; white count of 3.6, hemoglobin was 9.9, hematocrit 34.6, and platelet count of 88. BMET with a sodium of 135, potassium 4.5, chloride 103, bicarb 28, glucose 84, BUN  9, creatinine 0.61.  He had a repeat hepatitis C viral loads that was done which actually showed a decreased viral load at 1,510,000 copies.     Melida Quitter, MD   ______________________________ Jose Krueger, D.O.    AS/MEDQ  D:  01/13/2011  T:  01/14/2011  Job:  [id number]  cc:   Jose Jarvis, MD Barbette Hair. Jose Dice, MD,FACG  Electronically Signed by Melida Quitter  on 01/14/2011 02:28:10 PM Electronically Signed by Anderson Malta D.O. on 01/21/2011 01:47:59 PM

## 2011-02-09 ENCOUNTER — Ambulatory Visit (INDEPENDENT_AMBULATORY_CARE_PROVIDER_SITE_OTHER): Payer: Self-pay | Admitting: Internal Medicine

## 2011-02-09 ENCOUNTER — Encounter: Payer: Self-pay | Admitting: Internal Medicine

## 2011-02-09 VITALS — BP 133/83 | HR 74 | Temp 97.4°F | Wt 193.7 lb

## 2011-02-09 DIAGNOSIS — I81 Portal vein thrombosis: Secondary | ICD-10-CM

## 2011-02-09 DIAGNOSIS — M545 Low back pain: Secondary | ICD-10-CM

## 2011-02-09 DIAGNOSIS — D696 Thrombocytopenia, unspecified: Secondary | ICD-10-CM

## 2011-02-09 DIAGNOSIS — F1010 Alcohol abuse, uncomplicated: Secondary | ICD-10-CM

## 2011-02-09 DIAGNOSIS — B1920 Unspecified viral hepatitis C without hepatic coma: Secondary | ICD-10-CM

## 2011-02-09 DIAGNOSIS — K219 Gastro-esophageal reflux disease without esophagitis: Secondary | ICD-10-CM

## 2011-02-09 DIAGNOSIS — F329 Major depressive disorder, single episode, unspecified: Secondary | ICD-10-CM

## 2011-02-09 DIAGNOSIS — K7460 Unspecified cirrhosis of liver: Secondary | ICD-10-CM

## 2011-02-09 MED ORDER — FAMOTIDINE 20 MG PO TABS: 20.0000 mg | ORAL_TABLET | Freq: Two times a day (BID) | ORAL | Status: DC

## 2011-02-09 MED ORDER — CITALOPRAM HYDROBROMIDE 10 MG PO TABS: 10.0000 mg | ORAL_TABLET | Freq: Every day | ORAL | Status: DC

## 2011-02-09 MED ORDER — OXYCODONE HCL 5 MG PO TABS: 5.0000 mg | ORAL_TABLET | Freq: Three times a day (TID) | ORAL | Status: AC | PRN

## 2011-02-09 NOTE — Patient Instructions (Signed)
Please take your medicines as prescribed. Please schedule a follow up appointment in 2 months or earlier if needed.

## 2011-02-10 ENCOUNTER — Encounter: Payer: Self-pay | Admitting: Internal Medicine

## 2011-02-10 ENCOUNTER — Telehealth: Payer: Self-pay | Admitting: Licensed Clinical Social Worker

## 2011-02-10 DIAGNOSIS — D696 Thrombocytopenia, unspecified: Secondary | ICD-10-CM | POA: Insufficient documentation

## 2011-02-10 DIAGNOSIS — I81 Portal vein thrombosis: Secondary | ICD-10-CM | POA: Insufficient documentation

## 2011-02-10 DIAGNOSIS — M545 Low back pain: Secondary | ICD-10-CM | POA: Insufficient documentation

## 2011-02-10 DIAGNOSIS — F329 Major depressive disorder, single episode, unspecified: Secondary | ICD-10-CM | POA: Insufficient documentation

## 2011-02-10 DIAGNOSIS — B1920 Unspecified viral hepatitis C without hepatic coma: Secondary | ICD-10-CM | POA: Insufficient documentation

## 2011-02-10 DIAGNOSIS — K7460 Unspecified cirrhosis of liver: Secondary | ICD-10-CM | POA: Insufficient documentation

## 2011-02-10 DIAGNOSIS — F1010 Alcohol abuse, uncomplicated: Secondary | ICD-10-CM | POA: Insufficient documentation

## 2011-02-10 NOTE — Assessment & Plan Note (Addendum)
Ultrasound confirmed 05/12. He had a GI consult during hospitalization - recs against anticoagulation. Will continue to follow.

## 2011-02-10 NOTE — Assessment & Plan Note (Signed)
No signs and symptoms of active bleeding. He denies any melena or hematemesis. Will check his CBC with next visit.

## 2011-02-10 NOTE — Assessment & Plan Note (Signed)
He was counseled on alcohol cessation again. He stated that his last drink was a week ago. He has been trying to quit.

## 2011-02-10 NOTE — Assessment & Plan Note (Addendum)
His hepatitis C viral load was more than 1000,000 copies from 05/12. He had a GI consult by Dr. Arlyce Dice during the hospitalization who thinks he is not a candidate for ribavarin or interferon  therapy due to advanced progression of cirrhosis. He  underwent CT and MRI in 05/12 which showed 2 small hepatic nodules that were most likely consistent with hepatocellular carcinoma. Given the size and location of the nodules biopsy could not be done. The plan was to get repeat MRI on an outpatient basis as a follow up on the size of nodules. Hopefully patient will get Medicaid in a few weeks and will schedule MRI in 2-3 weeks for then . Our social worker Lupita Leash T  will also help to expedite the process and he was also referred to [facility], our financial councellor.

## 2011-02-10 NOTE — Assessment & Plan Note (Addendum)
Patient stated that he was feeling depressed. He could barely sleep for 1-2 hours. He constantly thinks about his insurance, illness and finances. Social worker consult was placed to discuss various stress reliever aids and therapy. Our social worker Lupita Leash T, will also help in expediting his Medicaid that he has already applied for. Will also start him on Celexa for his depression. Patient was explained that it might take a couple of weeks for medication to start acting. He was advised to call the clinic if he experiences some side effects from new medication like worsening of his depression.I will titrate up the dose if needed in future visits. He was also advised to call 911 or clinic if he has suicidal thoughts or ideation.

## 2011-02-10 NOTE — Assessment & Plan Note (Addendum)
He has chronic back pain since he fell at work 4-5 years ago. The pain radiates to his right leg. DD include degenerative disk disease, radiculopathy or spinal stenosis. He was offered physical therapy but without insurance , it will be difficult. Will give him some oxycodone for symptomatic management. Will avoid any tylenol containing pain meds. Will defer any imaging at this point but if pain persists or gets worse, we may consider getting some imaging with subsequent visits. Hopefully , till then he will have some insurance or orange card.

## 2011-02-10 NOTE — Progress Notes (Signed)
  Subjective:    Patient ID: Jose Krueger, male    DOB: [birth year], 55 y.o.   MRN: [id number]  HPI: 55 year old man the past medical history significant for alcohol abuse, hepatitis C,  liver cirrhosis who was recently discharged from the hospital on 01/13/2011 for portal vein thrombosis comes to the clinic for a followup visit. He still complains of some intermittent , dull right upper quadrant discomfort. He also reports lower back pain that radiates to his right leg. This back pain has persisted for 4-5 years after he fell at work. He  states that he is undergoing a lot of stress and is barely able to sleep for 1 hour during the day. He keeps thinking about his insurance and  his illness. He clearly states that he is depressed and is having a hard time to cope with the situation.    Review of Systems  Constitutional: Positive for fatigue. Negative for fever and chills.  HENT: Negative for congestion, rhinorrhea and postnasal drip.   Respiratory: Negative for cough, choking, chest tightness and shortness of breath.   Cardiovascular: Negative for chest pain, palpitations and leg swelling.  Gastrointestinal: Negative for abdominal distention.  Genitourinary: Negative for dysuria.  Musculoskeletal: Negative for arthralgias.  Neurological: Negative for dizziness.       Objective:   Physical Exam  Constitutional: He is oriented to person, place, and time. He appears well-developed and well-nourished.  HENT:  Head: Normocephalic and atraumatic.  Eyes: Conjunctivae and EOM are normal. Pupils are equal, round, and reactive to light.  Neck: Normal range of motion. Neck supple. No tracheal deviation present. No thyromegaly present.  Cardiovascular: Normal rate, normal heart sounds and intact distal pulses.  Exam reveals no gallop and no friction rub.   No murmur heard. Pulmonary/Chest: Effort normal and breath sounds normal.  Abdominal: Soft. He exhibits no mass. There is tenderness. There  is no rebound and no guarding.       Mild tender to palpation in the RUQ  Musculoskeletal: Normal range of motion.  Neurological: He is alert and oriented to person, place, and time. No cranial nerve deficit. Coordination normal.  Skin: Skin is warm.          Assessment & Plan:

## 2011-02-11 MED ORDER — CITALOPRAM HYDROBROMIDE 10 MG PO TABS: ORAL_TABLET | ORAL | Status: DC

## 2011-02-12 NOTE — Telephone Encounter (Signed)
I called the patient to reinforce that he needs to take 1 tablet for 4 days and then he can take 2 tabs daily. I will call him again in 2 weeks to ask about acceptance of medication, reassess suicidality and address adverse effects. Will call in the prescription for 20 mg of celexa at that time.

## 2011-02-18 ENCOUNTER — Telehealth: Payer: Self-pay | Admitting: Licensed Clinical Social Worker

## 2011-02-19 NOTE — Telephone Encounter (Signed)
Left message on 6/12 and again today.   02/19/11  Koleen Distance

## 2011-02-23 ENCOUNTER — Other Ambulatory Visit: Payer: Self-pay | Admitting: Licensed Clinical Social Worker

## 2011-02-23 DIAGNOSIS — F1010 Alcohol abuse, uncomplicated: Secondary | ICD-10-CM

## 2011-02-23 NOTE — Telephone Encounter (Signed)
Sent letter to have patient call social work and set up appointment for assessment and planning.

## 2011-03-09 NOTE — Telephone Encounter (Signed)
30 minutes.  Phone assessment with patient per referral from Dr. Dorthula Rue for multiple issues including depression, hx of alcohol abuse, cirrhosis of liver, uninsured, unemployed.  Patient has limited access to transportation.   Patient was very pleasant and cooperative on the phone though admits to some memory issues.  He relates hx as follows:   He was involved with [facility] for two months here in [place] and then was hospitalized.  He is now living with his friend Jasmine December in [place] /phone number listed on the chart.  He is unemployed and without insurance.  He has applied for Medicaid but has been denied but reports the decision is under appeals.  He reported having Medicaid once before but he is unclear about timeframe. He was linked with Disability assist thru [facility] but doesn't remember who helped him with this.  He has applied for Foodstamps.   He is off his Celexa because he cannot afford [employer] copay.  He cannot afford oxycodone.   His cell number is [phone].  Social Supports:  He is church going, his brother lives in [place] and helps pay his [address].  Jasmine December helps out when she can but has a family of her own.  He said he has been living with Jasmine December for at least five years.   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   Patient tells me he is clean of alcohol and drugs and that is not an issue right now.  He smokes one pack of cigarettes per week.   Patient would like his friend Jasmine December to call me for further coordination of care.   A/P  Patient with multiple health, MH and financial needs.  Plan is to coordinate with [facility] for Disability/Medicaid. Speak with Jasmine December about connection to area Mental Health and sliding scale discount for Mr. Gatling.  [place] out of county [place] for any other programs but should be connected to that county's MH program.   Suggested reconnect to AA in [facility] for support while he is waiting on Medicaid/Disability.  We will write letter of support on his behalf regarding Disability.

## 2011-05-27 LAB — DIFFERENTIAL
Basophils Absolute: 0.1
Basophils Relative: 2 — ABNORMAL HIGH
Eosinophils Absolute: 0.1
Eosinophils Relative: 2
Lymphocytes Relative: 72 — ABNORMAL HIGH
Lymphs Abs: 3.6
Monocytes Absolute: 0.5
Monocytes Relative: 10
Neutro Abs: 0.7 — ABNORMAL LOW
Neutrophils Relative %: 14 — ABNORMAL LOW

## 2011-05-27 LAB — RAPID URINE DRUG SCREEN, HOSP PERFORMED
Amphetamines: NOT DETECTED
Barbiturates: NOT DETECTED
Benzodiazepines: NOT DETECTED
Cocaine: NOT DETECTED
Opiates: NOT DETECTED
Tetrahydrocannabinol: NOT DETECTED

## 2011-05-27 LAB — COMPREHENSIVE METABOLIC PANEL
ALT: 94 — ABNORMAL HIGH
AST: 234 — ABNORMAL HIGH
Alkaline Phosphatase: 70
CO2: 24
Calcium: 9.2
Chloride: 109
GFR calc Af Amer: >60
GFR calc non Af Amer: >60
Glucose, Bld: 92
Potassium: 4.1
Sodium: 141

## 2011-05-27 LAB — CBC
HCT: 43.8
Hemoglobin: 15.4
MCHC: 35.1
MCV: 100.1 — ABNORMAL HIGH
Platelets: 56 — ABNORMAL LOW
RBC: 4.37
RDW: 14.7
WBC: 4.9

## 2011-05-27 LAB — ETHANOL
Alcohol, Ethyl (B): 146 — ABNORMAL HIGH
Alcohol, Ethyl (B): 317 — ABNORMAL HIGH
Alcohol, Ethyl (B): 411

## 2011-05-27 LAB — COMPREHENSIVE METABOLIC PANEL WITH GFR
Albumin: 3.4 — ABNORMAL LOW
BUN: 6
Creatinine, Ser: 0.6
Total Bilirubin: 1.5 — ABNORMAL HIGH
Total Protein: 8.7 — ABNORMAL HIGH

## 2014-02-24 ENCOUNTER — Encounter (HOSPITAL_COMMUNITY): Payer: Self-pay | Admitting: Emergency Medicine

## 2014-02-24 ENCOUNTER — Emergency Department (HOSPITAL_COMMUNITY): Payer: Self-pay

## 2014-02-24 ENCOUNTER — Emergency Department (HOSPITAL_COMMUNITY)
Admission: EM | Admit: 2014-02-24 | Discharge: 2014-02-27 | Disposition: A | Discharge status: Other Acute Inpatient Hospital | Payer: Self-pay | Attending: Emergency Medicine | Admitting: Emergency Medicine

## 2014-02-24 DIAGNOSIS — F32A Depression, unspecified: Secondary | ICD-10-CM

## 2014-02-24 DIAGNOSIS — Z8719 Personal history of other diseases of the digestive system: Secondary | ICD-10-CM | POA: Insufficient documentation

## 2014-02-24 DIAGNOSIS — F1910 Other psychoactive substance abuse, uncomplicated: Secondary | ICD-10-CM

## 2014-02-24 DIAGNOSIS — Z86718 Personal history of other venous thrombosis and embolism: Secondary | ICD-10-CM | POA: Insufficient documentation

## 2014-02-24 DIAGNOSIS — Y929 Unspecified place or not applicable: Secondary | ICD-10-CM | POA: Insufficient documentation

## 2014-02-24 DIAGNOSIS — F329 Major depressive disorder, single episode, unspecified: Secondary | ICD-10-CM

## 2014-02-24 DIAGNOSIS — G8929 Other chronic pain: Secondary | ICD-10-CM | POA: Insufficient documentation

## 2014-02-24 DIAGNOSIS — F3289 Other specified depressive episodes: Secondary | ICD-10-CM | POA: Insufficient documentation

## 2014-02-24 DIAGNOSIS — R296 Repeated falls: Secondary | ICD-10-CM | POA: Insufficient documentation

## 2014-02-24 DIAGNOSIS — Y9389 Activity, other specified: Secondary | ICD-10-CM | POA: Insufficient documentation

## 2014-02-24 DIAGNOSIS — IMO0002 Reserved for concepts with insufficient information to code with codable children: Secondary | ICD-10-CM | POA: Insufficient documentation

## 2014-02-24 DIAGNOSIS — F17200 Nicotine dependence, unspecified, uncomplicated: Secondary | ICD-10-CM | POA: Insufficient documentation

## 2014-02-24 DIAGNOSIS — F1010 Alcohol abuse, uncomplicated: Secondary | ICD-10-CM | POA: Insufficient documentation

## 2014-02-24 DIAGNOSIS — Z8619 Personal history of other infectious and parasitic diseases: Secondary | ICD-10-CM | POA: Insufficient documentation

## 2014-02-24 HISTORY — DX: Thrombocytopenia, unspecified: D69.6

## 2014-02-24 HISTORY — DX: Tremor, unspecified: R25.1

## 2014-02-24 HISTORY — DX: Other pancytopenia: D61.818

## 2014-02-24 LAB — COMPREHENSIVE METABOLIC PANEL
ALBUMIN: 3.2 g/dL — AB (ref 3.5–5.2)
ALT: 44 U/L (ref 0–53)
AST: 94 U/L — AB (ref 0–37)
Alkaline Phosphatase: 73 U/L (ref 39–117)
BILIRUBIN TOTAL: 1 mg/dL (ref 0.3–1.2)
BUN: 6 mg/dL (ref 6–23)
CHLORIDE: 107 meq/L (ref 96–112)
CO2: 23 mEq/L (ref 19–32)
CREATININE: 0.61 mg/dL (ref 0.50–1.35)
Calcium: 9.2 mg/dL (ref 8.4–10.5)
GFR calc Af Amer: >90 mL/min (ref >90)
GFR calc non Af Amer: >90 mL/min (ref >90)
Glucose, Bld: 102 mg/dL — ABNORMAL HIGH (ref 70–99)
POTASSIUM: 4.3 meq/L (ref 3.7–5.3)
Sodium: 144 mEq/L (ref 137–147)
TOTAL PROTEIN: 7.9 g/dL (ref 6.0–8.3)

## 2014-02-24 LAB — ETHANOL
ALCOHOL ETHYL (B): 238 mg/dL — AB (ref 0–11)
Alcohol, Ethyl (B): 339 mg/dL — ABNORMAL HIGH (ref 0–11)

## 2014-02-24 LAB — RAPID URINE DRUG SCREEN, HOSP PERFORMED
AMPHETAMINES: NOT DETECTED
BENZODIAZEPINES: NOT DETECTED
Barbiturates: NOT DETECTED
COCAINE: NOT DETECTED
Opiates: NOT DETECTED
Tetrahydrocannabinol: NOT DETECTED

## 2014-02-24 LAB — CBC WITH DIFFERENTIAL/PLATELET
BASOS ABS: 0 10*3/uL (ref 0.0–0.1)
Basophils Relative: 1 % (ref 0–1)
EOS ABS: 0.1 10*3/uL (ref 0.0–0.7)
Eosinophils Relative: 4 % (ref 0–5)
HCT: 39.5 % (ref 39.0–52.0)
Hemoglobin: 13.3 g/dL (ref 13.0–17.0)
LYMPHS ABS: 2.4 10*3/uL (ref 0.7–4.0)
Lymphocytes Relative: 68 % — ABNORMAL HIGH (ref 12–46)
MCH: 33.6 pg (ref 26.0–34.0)
MCHC: 33.7 g/dL (ref 30.0–36.0)
MCV: 99.7 fL (ref 78.0–100.0)
MONO ABS: 0.4 10*3/uL (ref 0.1–1.0)
Monocytes Relative: 11 % (ref 3–12)
Neutro Abs: 0.5 10*3/uL — ABNORMAL LOW (ref 1.7–7.7)
Neutrophils Relative %: 16 % — ABNORMAL LOW (ref 43–77)
PLATELETS: 71 10*3/uL — AB (ref 150–400)
RBC: 3.96 MIL/uL — ABNORMAL LOW (ref 4.22–5.81)
RDW: 15.5 % (ref 11.5–15.5)
WBC: 3.4 10*3/uL — AB (ref 4.0–10.5)

## 2014-02-24 LAB — ACETAMINOPHEN LEVEL

## 2014-02-24 LAB — SALICYLATE LEVEL

## 2014-02-24 MED ORDER — LORAZEPAM 1 MG PO TABS
0.0000 mg | ORAL_TABLET | Freq: Four times a day (QID) | ORAL | Status: AC
  Administered 2014-02-24 (×2): 1 mg via ORAL
  Administered 2014-02-25 (×2): 2 mg via ORAL
  Administered 2014-02-26 (×2): 1 mg via ORAL
  Filled 2014-02-24 (×5): qty 1
  Filled 2014-02-24: qty 2
  Filled 2014-02-24: qty 1

## 2014-02-24 MED ORDER — THIAMINE HCL 100 MG/ML IJ SOLN: 100.0000 mg | Freq: Every day | INTRAMUSCULAR | Status: DC

## 2014-02-24 MED ORDER — VITAMIN B-1 100 MG PO TABS
100.0000 mg | ORAL_TABLET | Freq: Every day | ORAL | Status: DC
  Administered 2014-02-24 – 2014-02-27 (×4): 100 mg via ORAL
  Filled 2014-02-24 (×4): qty 1

## 2014-02-24 MED ORDER — LORAZEPAM 1 MG PO TABS
0.0000 mg | ORAL_TABLET | Freq: Two times a day (BID) | ORAL | Status: DC
  Filled 2014-02-24: qty 1

## 2014-02-24 NOTE — BH Assessment (Signed)
Discussed results of TA with Serena Colonel, NP. Pt meets criteria for inpatient detox and should be referred to [facility], and [facility].   R. Browing, PA is in agreement with plan and reports willingness to help Pt receive placement in any way that he can.   Lear Ng, [facility] Triage Specialist 02/24/2014 10:40 PM

## 2014-02-24 NOTE — ED Notes (Addendum)
Pt here for detox from alcohol. States he has drank liquor and beer today. Denies any drug use. Denies any SI or HI. Pt c/o abd pain and feels nauseated at present

## 2014-02-24 NOTE — BH Assessment (Signed)
Asked Shirlean Mylar, RN to have TA equipment set up.   Lorre Munroe PA reports Pt would like help with alcohol detox, no SI/SI present.   TA to commence shortly.   Lear Ng, [facility] Triage Specialist 02/24/2014 9:20 PM
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
## 2014-02-24 NOTE — ED Provider Notes (Signed)
CSN: [id number]     Arrival date & time 02/24/14  1502 History   First MD Initiated Contact with Patient 02/24/14 1558     Chief Complaint  Patient presents with  . Addiction Problem    detox from alcohol     (Consider location/radiation/quality/duration/timing/severity/associated sxs/prior Treatment) HPI Comments: Patient presents emergency department with chief complaint of requesting detox from alcohol. He states that he drinks at least a 40 ounce beer per day, and tries to drink as much vodka is possibly can. States that his last drink was this morning prior to arrival. He denies any recreational drug use. Denies any SI, or HI. Contrary to nursing note, patient does not complain of any abdominal pain during my history. He does state that he feels slightly nauseated. Has complaints of chronic low back pain. Denies any fevers, chills, vomiting, diarrhea, or constipation.  The history is provided by the patient. No language interpreter was used.    Past Medical History  Diagnosis Date  . Hepatitis C   . Cirrhosis   . Alcohol abuse   . Portal vein thrombosis    History reviewed. No pertinent past surgical history. Family History  Problem Relation Age of Onset  . Cancer Mother     type unknown  . Alcohol abuse Father   . Alcohol abuse Brother    History  Substance Use Topics  . Smoking status: Current Every Day Smoker -- 0.50 packs/day    Types: Cigarettes  . Smokeless tobacco: Not on file  . Alcohol Use: Yes     Comment: drinks liqour and beer daily    Review of Systems  Constitutional: Negative for fever and chills.  Respiratory: Negative for shortness of breath.   Cardiovascular: Negative for chest pain.  Gastrointestinal: Positive for nausea. Negative for vomiting, diarrhea and constipation.  Genitourinary: Negative for dysuria.  Musculoskeletal: Positive for back pain.      Allergies  Review of patient's allergies indicates no known allergies.  Home  Medications   Prior to Admission medications   Medication Sig Start Date End Date Taking? Authorizing Provider  aspirin 325 MG tablet Take 325 mg by mouth daily as needed for mild pain.   Yes Historical Provider, MD  ibuprofen (ADVIL,MOTRIN) 200 MG tablet Take 200-800 mg by mouth every 6 (six) hours as needed.   Yes Historical Provider, MD   BP 120/68  Pulse 84  Temp(Src) 98.4 F (36.9 C) (Oral)  Resp 16  SpO2 100% Physical Exam  Nursing note and vitals reviewed. Constitutional: He is oriented to person, place, and time. He appears well-developed and well-nourished.  HENT:  Head: Normocephalic and atraumatic.  Eyes: Conjunctivae and EOM are normal. Pupils are equal, round, and reactive to light. Right eye exhibits no discharge. Left eye exhibits no discharge. No scleral icterus.  Neck: Normal range of motion. Neck supple. No JVD present.  Cardiovascular: Normal rate, regular rhythm and normal heart sounds.  Exam reveals no gallop and no friction rub.   No murmur heard. Pulmonary/Chest: Effort normal and breath sounds normal. No respiratory distress. He has no wheezes. He has no rales. He exhibits no tenderness.  Abdominal: Soft. He exhibits no distension and no mass. There is no tenderness. There is no rebound and no guarding.  No focal abdominal tenderness, no RLQ tenderness or pain at McBurney's point, no RUQ tenderness or Murphy's sign, no left-sided abdominal tenderness, no fluid wave, or signs of peritonitis   Musculoskeletal: Normal range of motion. He exhibits no  edema and no tenderness.  None CTLS spine tenderness, no bony step-offs or deformities, moves all extremities  Neurological: He is alert and oriented to person, place, and time.  Skin: Skin is warm and dry.  Psychiatric: He has a normal mood and affect. His behavior is normal. Judgment and thought content normal.    ED Course  Procedures (including critical care time) Results for orders placed during the hospital  encounter of 02/24/14  ACETAMINOPHEN LEVEL      Result Value Ref Range   Acetaminophen (Tylenol), Serum <15.0  10 - 30 ug/mL  COMPREHENSIVE METABOLIC PANEL      Result Value Ref Range   Sodium 144  137 - 147 mEq/L   Potassium 4.3  3.7 - 5.3 mEq/L   Chloride 107  96 - 112 mEq/L   CO2 23  19 - 32 mEq/L   Glucose, Bld 102 (*) 70 - 99 mg/dL   BUN 6  6 - 23 mg/dL   Creatinine, Ser 0.61  0.50 - 1.35 mg/dL   Calcium 9.2  8.4 - 10.5 mg/dL   Total Protein 7.9  6.0 - 8.3 g/dL   Albumin 3.2 (*) 3.5 - 5.2 g/dL   AST 94 (*) 0 - 37 U/L   ALT 44  0 - 53 U/L   Alkaline Phosphatase 73  39 - 117 U/L   Total Bilirubin 1.0  0.3 - 1.2 mg/dL   GFR calc non Af Amer >90  >90 mL/min   GFR calc Af Amer >90  >90 mL/min  ETHANOL      Result Value Ref Range   Alcohol, Ethyl (B) 339 (*) 0 - 11 mg/dL  SALICYLATE LEVEL      Result Value Ref Range   Salicylate Lvl <9.4 (*) 2.8 - 20.0 mg/dL  URINE RAPID DRUG SCREEN (HOSP PERFORMED)      Result Value Ref Range   Opiates NONE DETECTED  NONE DETECTED   Cocaine NONE DETECTED  NONE DETECTED   Benzodiazepines NONE DETECTED  NONE DETECTED   Amphetamines NONE DETECTED  NONE DETECTED   Tetrahydrocannabinol NONE DETECTED  NONE DETECTED   Barbiturates NONE DETECTED  NONE DETECTED  CBC WITH DIFFERENTIAL      Result Value Ref Range   WBC 3.4 (*) 4.0 - 10.5 K/uL   RBC 3.96 (*) 4.22 - 5.81 MIL/uL   Hemoglobin 13.3  13.0 - 17.0 g/dL   HCT 39.5  39.0 - 52.0 %   MCV 99.7  78.0 - 100.0 fL   MCH 33.6  26.0 - 34.0 pg   MCHC 33.7  30.0 - 36.0 g/dL   RDW 15.5  11.5 - 15.5 %   Platelets 71 (*) 150 - 400 K/uL   Neutrophils Relative % 16 (*) 43 - 77 %   Lymphocytes Relative 68 (*) 12 - 46 %   Monocytes Relative 11  3 - 12 %   Eosinophils Relative 4  0 - 5 %   Basophils Relative 1  0 - 1 %   Neutro Abs 0.5 (*) 1.7 - 7.7 K/uL   Lymphs Abs 2.4  0.7 - 4.0 K/uL   Monocytes Absolute 0.4  0.1 - 1.0 K/uL   Eosinophils Absolute 0.1  0.0 - 0.7 K/uL   Basophils Absolute 0.0  0.0 -  0.1 K/uL   WBC Morphology WHITE COUNT CONFIRMED ON SMEAR     Smear Review PLATELET CLUMPS NOTED ON SMEAR    ETHANOL      Result Value Ref  Range   Alcohol, Ethyl (B) 238 (*) 0 - 11 mg/dL   Dg Lumbar Spine Complete  02/24/2014   CLINICAL DATA:  Chronic low back pain radiating down the left leg.  EXAM: LUMBAR SPINE - COMPLETE 4+ VIEW  COMPARISON:  CT abdomen and pelvis 01/03/2011. Lumbar spine radiographs 11/18/2007.  FINDINGS: There are 5 non-rib-bearing lumbar type vertebral bodies. Vertebral alignment is normal. Vertebral body heights are preserved. Very mild endplate osteophyte formation is noted in the lumbar and lower thoracic spine. Intervertebral disc space heights are relatively well preserved. No pars defects are identified.  IMPRESSION: No evidence of acute osseous abnormality.  Mild spondylosis.   Electronically Signed   By: Logan Bores   On: 02/24/2014 17:48      EKG Interpretation None      MDM   Final diagnoses:  None    Patient requesting detox from alcohol. No SI or HI. Also complains of chronic low back pain. States that he fell a few weeks ago. Will check labs.  Medically clear.  ETOH trending down.  Patient on CIWA.  11:32 PM Pt meets inpatient detox criteria and should be referred to [facility] and [facility]. TTS will seek appropriate placement.   Montine Circle, PA-C 02/24/14 2333

## 2014-02-24 NOTE — ED Notes (Signed)
Patient transported to X-ray 

## 2014-02-25 MED ORDER — ACETAMINOPHEN 325 MG PO TABS
650.0000 mg | ORAL_TABLET | Freq: Once | ORAL | Status: AC
  Administered 2014-02-25: 650 mg via ORAL

## 2014-02-25 NOTE — ED Notes (Signed)
Pt. Has a visitor her to see him. Visitor was wanded and valuables were kept at the front by security.

## 2014-02-25 NOTE — Progress Notes (Signed)
[facility]: Referral Faxed [facility]: At Capacity [facility]: At [facility]: At [place] Disposition Tech

## 2014-02-25 NOTE — BH Assessment (Signed)
Pt is a [name] seeking etoh detox after drinking daily for more than 30 years. Pt is being reassessed for for current sx, and LOC needs.   Pt is alert and oriented times 4, appears sleepy, and slightly disheveled. Pt reports he is eating and sleeping little in the hospital. He reports feeling his symptoms of depression and anxiety have increased since admission. Pt reports he feels sad and grumpy all the time. Pt indicated he is feeling anxious, and restless, unable to stay still. Pt is complaining of sweating a lot, headache, stomach ache, and nausea.  Pt sweating was evident during assessment. Pt denies SI/HI, denies sx of psychosis. Pt continues to meet inpatient criteria for detox due to active withdrawal sx. TTS still seeking placement.   Provided Pt's nurse Tiffany, RN with an update regarding LOC needs, and current symptoms.   Lear Ng, [facility] Triage Specialist 02/25/2014 11:33 PM

## 2014-02-25 NOTE — ED Notes (Signed)
Pt ate 100% of his breakfast.

## 2014-02-26 DIAGNOSIS — F1010 Alcohol abuse, uncomplicated: Secondary | ICD-10-CM

## 2014-02-26 DIAGNOSIS — F332 Major depressive disorder, recurrent severe without psychotic features: Secondary | ICD-10-CM

## 2014-02-26 DIAGNOSIS — F1994 Other psychoactive substance use, unspecified with psychoactive substance-induced mood disorder: Secondary | ICD-10-CM

## 2014-02-26 LAB — PATHOLOGIST SMEAR REVIEW

## 2014-02-26 MED ORDER — ACETAMINOPHEN 325 MG PO TABS
650.0000 mg | ORAL_TABLET | Freq: Once | ORAL | Status: AC
  Administered 2014-02-26: 650 mg via ORAL
  Filled 2014-02-26: qty 2

## 2014-02-26 MED ORDER — LORAZEPAM 1 MG PO TABS
0.0000 mg | ORAL_TABLET | Freq: Four times a day (QID) | ORAL | Status: DC
  Administered 2014-02-26 – 2014-02-27 (×2): 1 mg via ORAL
  Filled 2014-02-26 (×2): qty 1

## 2014-02-26 MED ORDER — LORAZEPAM 1 MG PO TABS: 0.0000 mg | ORAL_TABLET | Freq: Two times a day (BID) | ORAL | Status: DC

## 2014-02-26 MED ORDER — LORAZEPAM 1 MG PO TABS
0.0000 mg | ORAL_TABLET | Freq: Two times a day (BID) | ORAL | Status: DC
  Administered 2014-02-26: 1 mg via ORAL

## 2014-02-26 NOTE — BH Assessment (Signed)
Per Clayborne Dana, [facility] at [facility], adult unit is at capacity. Contacted the following facilities for detox treatment:  [facility]- At capacity per [facility]- At capacity per [facility]- Possible bed available. Faxed clinical information.   Orpah Greek Rosana Hoes, [facility] Triage Specialist [phone]

## 2014-02-26 NOTE — ED Notes (Signed)
Pt up walking around in hallway to the bathroom. Call from girlfriend for him at the desk.

## 2014-02-26 NOTE — ED Provider Notes (Signed)
Pt alert, ambulatory about ED, on phone.  Normal mood/affect.  Discussed w psych team - awaiting etoh rehab placement.     Mirna Mires, MD 02/26/14 1452

## 2014-02-26 NOTE — BH Assessment (Addendum)
Spoke with Beth at [facility] she reports that she did not receive prior fax, and if pt is still in need of bed placement she is requesting that patient's clinical information be faxed again.  Shaune Pollack, MS, [place] Assessment Counselor
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
## 2014-02-26 NOTE — ED Notes (Signed)
Pt provided sprite 

## 2014-02-26 NOTE — Consult Note (Signed)
Hepatitis C and alcoholic cirrhosis complicate acute stabilization and management of alcohol intoxication 339 mg/dL and withdrawal, warranting inpatient treatment resources at [facility]  or equivalent elsewhere when available and possible.  Delight Hoh, MD

## 2014-02-26 NOTE — BH Assessment (Signed)
Patient is scheduled for a telepsych consult today to determine if inpatient treatment is warranted.  Shaune Pollack, MS, [place] Assessment Counselor

## 2014-02-26 NOTE — BH Assessment (Signed)
[facility] Assessment Progress Note   -[facility] Krue[facility]) is full as of 23:20. -[facility], no answer at 23:22. -[facility], no answer at 23:24 [name] no beds as of 23:28.

## 2014-02-26 NOTE — BH Assessment (Signed)
[place] Assessment Progress Note  At 16:35 I called [facility] to follow up on a referral made earlier today.  They report that pt's platelet level is too low for them to manage, and that pt is therefore declined.  I then discussed this with Catalina Pizza, NP.  He reports that this platelet level is too low for pt to be managed at either [facility] or at [facility], as well.  TTS is to seek placement at a geriatric psychiatry facility that is able to provide detoxification from alcohol.  At his recommendation I called back to [facility] to cancel the referral.  I also informed Letitia Libra, RN, [facility] that pt would not be admitted to [facility].  Jalene Mullet, MA Triage Specialist 02/26/2014 @ 18:54

## 2014-02-26 NOTE — ED Notes (Signed)
Patient information faxed to Ernest, Intake at [facility]

## 2014-02-27 ENCOUNTER — Encounter (HOSPITAL_COMMUNITY): Payer: Self-pay | Admitting: Emergency Medicine

## 2014-02-27 ENCOUNTER — Observation Stay (HOSPITAL_COMMUNITY)
Admission: EM | Admit: 2014-02-27 | Discharge: 2014-02-28 | Disposition: A | Discharge status: Home / Self Care | Payer: Self-pay | Source: Intra-hospital | Attending: Psychiatry | Admitting: Psychiatry

## 2014-02-27 DIAGNOSIS — K7030 Alcoholic cirrhosis of liver without ascites: Secondary | ICD-10-CM | POA: Insufficient documentation

## 2014-02-27 DIAGNOSIS — F332 Major depressive disorder, recurrent severe without psychotic features: Secondary | ICD-10-CM | POA: Insufficient documentation

## 2014-02-27 DIAGNOSIS — F1014 Alcohol abuse with alcohol-induced mood disorder: Secondary | ICD-10-CM | POA: Diagnosis present

## 2014-02-27 DIAGNOSIS — F10988 Alcohol use, unspecified with other alcohol-induced disorder: Secondary | ICD-10-CM | POA: Insufficient documentation

## 2014-02-27 DIAGNOSIS — I81 Portal vein thrombosis: Secondary | ICD-10-CM | POA: Insufficient documentation

## 2014-02-27 DIAGNOSIS — F1020 Alcohol dependence, uncomplicated: Principal | ICD-10-CM | POA: Insufficient documentation

## 2014-02-27 DIAGNOSIS — F17200 Nicotine dependence, unspecified, uncomplicated: Secondary | ICD-10-CM | POA: Insufficient documentation

## 2014-02-27 DIAGNOSIS — R269 Unspecified abnormalities of gait and mobility: Secondary | ICD-10-CM | POA: Insufficient documentation

## 2014-02-27 DIAGNOSIS — G4700 Insomnia, unspecified: Secondary | ICD-10-CM | POA: Insufficient documentation

## 2014-02-27 DIAGNOSIS — R259 Unspecified abnormal involuntary movements: Secondary | ICD-10-CM | POA: Insufficient documentation

## 2014-02-27 DIAGNOSIS — B1920 Unspecified viral hepatitis C without hepatic coma: Secondary | ICD-10-CM | POA: Insufficient documentation

## 2014-02-27 MED ORDER — ALUM & MAG HYDROXIDE-SIMETH 200-200-20 MG/5ML PO SUSP: 30.0000 mL | ORAL | Status: DC | PRN

## 2014-02-27 MED ORDER — CHLORDIAZEPOXIDE HCL 25 MG PO CAPS
25.0000 mg | ORAL_CAPSULE | Freq: Four times a day (QID) | ORAL | Status: DC
  Administered 2014-02-27 – 2014-02-28 (×3): 25 mg via ORAL
  Filled 2014-02-27 (×3): qty 1

## 2014-02-27 MED ORDER — LOPERAMIDE HCL 2 MG PO CAPS: 2.0000 mg | ORAL_CAPSULE | ORAL | Status: DC | PRN

## 2014-02-27 MED ORDER — ACETAMINOPHEN 325 MG PO TABS: 650.0000 mg | ORAL_TABLET | Freq: Four times a day (QID) | ORAL | Status: DC | PRN

## 2014-02-27 MED ORDER — LORAZEPAM 1 MG PO TABS
1.0000 mg | ORAL_TABLET | Freq: Three times a day (TID) | ORAL | Status: DC | PRN
  Administered 2014-02-27: 1 mg via ORAL
  Filled 2014-02-27: qty 1

## 2014-02-27 MED ORDER — THIAMINE HCL 100 MG/ML IJ SOLN: 100.0000 mg | Freq: Once | INTRAMUSCULAR | Status: DC

## 2014-02-27 MED ORDER — ONDANSETRON 4 MG PO TBDP: 4.0000 mg | ORAL_TABLET | Freq: Four times a day (QID) | ORAL | Status: DC | PRN

## 2014-02-27 MED ORDER — IBUPROFEN 600 MG PO TABS: 600.0000 mg | ORAL_TABLET | Freq: Four times a day (QID) | ORAL | Status: DC | PRN

## 2014-02-27 MED ORDER — HYDROXYZINE HCL 25 MG PO TABS
25.0000 mg | ORAL_TABLET | Freq: Four times a day (QID) | ORAL | Status: DC | PRN
  Administered 2014-02-28: 25 mg via ORAL
  Filled 2014-02-27: qty 1

## 2014-02-27 MED ORDER — ADULT MULTIVITAMIN W/MINERALS CH
1.0000 | ORAL_TABLET | Freq: Every day | ORAL | Status: DC
  Administered 2014-02-28: 1 via ORAL
  Filled 2014-02-27 (×3): qty 1

## 2014-02-27 MED ORDER — ONDANSETRON 4 MG PO TBDP
4.0000 mg | ORAL_TABLET | Freq: Once | ORAL | Status: AC
  Administered 2014-02-27: 4 mg via ORAL
  Filled 2014-02-27: qty 1

## 2014-02-27 MED ORDER — TRAZODONE HCL 50 MG PO TABS
50.0000 mg | ORAL_TABLET | Freq: Every evening | ORAL | Status: DC | PRN
  Administered 2014-02-27 (×2): 50 mg via ORAL
  Filled 2014-02-27 (×6): qty 1

## 2014-02-27 MED ORDER — ONDANSETRON HCL 4 MG PO TABS
4.0000 mg | ORAL_TABLET | Freq: Three times a day (TID) | ORAL | Status: DC | PRN
  Administered 2014-02-27: 4 mg via ORAL
  Filled 2014-02-27: qty 1

## 2014-02-27 MED ORDER — ASPIRIN 325 MG PO TABS: 325.0000 mg | ORAL_TABLET | Freq: Every day | ORAL | Status: DC | PRN

## 2014-02-27 MED ORDER — CHLORDIAZEPOXIDE HCL 25 MG PO CAPS: 25.0000 mg | ORAL_CAPSULE | Freq: Every day | ORAL | Status: DC

## 2014-02-27 MED ORDER — CHLORDIAZEPOXIDE HCL 25 MG PO CAPS: 25.0000 mg | ORAL_CAPSULE | Freq: Four times a day (QID) | ORAL | Status: DC | PRN

## 2014-02-27 MED ORDER — IBUPROFEN 400 MG PO TABS
600.0000 mg | ORAL_TABLET | Freq: Three times a day (TID) | ORAL | Status: DC | PRN
  Administered 2014-02-27: 600 mg via ORAL
  Filled 2014-02-27 (×2): qty 1

## 2014-02-27 MED ORDER — CHLORDIAZEPOXIDE HCL 25 MG PO CAPS: 25.0000 mg | ORAL_CAPSULE | ORAL | Status: DC

## 2014-02-27 MED ORDER — CHLORDIAZEPOXIDE HCL 25 MG PO CAPS: 25.0000 mg | ORAL_CAPSULE | Freq: Three times a day (TID) | ORAL | Status: DC

## 2014-02-27 MED ORDER — MAGNESIUM HYDROXIDE 400 MG/5ML PO SUSP: 30.0000 mL | Freq: Every day | ORAL | Status: DC | PRN

## 2014-02-27 MED ORDER — VITAMIN B-1 100 MG PO TABS
100.0000 mg | ORAL_TABLET | Freq: Every day | ORAL | Status: DC
  Administered 2014-02-28: 100 mg via ORAL
  Filled 2014-02-27 (×3): qty 1

## 2014-02-27 NOTE — Progress Notes (Signed)
[facility] [facility] INPATIENT:  Family/Significant Other Suicide Prevention Education  Suicide Prevention Education:  Patient Refusal for Family/Significant Other Suicide Prevention Education: The patient Jose Krueger has refused to provide written consent for family/significant other to be provided Family/Significant Other Suicide Prevention Education during admission and/or prior to discharge.  Physician notified.  [name] 02/27/2014, 3:55 PM

## 2014-02-27 NOTE — Progress Notes (Signed)
Patient ID: Jose Krueger, male   DOB: August 25, 1956, 58 y.o.   MRN: [id number] Pt alert and oriented. Resting in bed. Denies pain or discomfort. Denies SI/HI, -A/V hall. Will continue to monitor and evaluate for stabilization.

## 2014-02-27 NOTE — Progress Notes (Signed)
Patient ID: Jose Krueger, male   DOB: 06-14-1956, 58 y.o.   MRN: [id number] Pt. Admitted to [facility] OBS unit after presenting at [facility] ED requesting ETOH detox. Pt. States he drinks "all the vodka he can get" and 40 oz. Beer daily. Pt. States he lives at a friends house with his girl friend. Pt. Is alert and oriented and cooperative. Pt. States he is on no medications at home at this time. Pt. States he has history of Hep C.

## 2014-02-27 NOTE — ED Provider Notes (Addendum)
C./o ha/ neck pain and back pain which is  chronic for him , for which he takes ibuprofen at home. Pt alert non toxic . 11 am pt slepingeasily arousable  gait unsteady  Orlie Dakin, MD 02/27/14 1100 Patient will be placed on observation. Accepted by Dr. Gevena Mart, MD 02/27/14 1254

## 2014-02-27 NOTE — Consult Note (Signed)
Psychiatric supervisory review notes severe alcohol intoxication with difficulty clearing metabolically the alcohol and any benzodiazepines for relative withdrawal symptoms likely due to alcoholic and hepatitis C cirrhosis with hypersplenism evident and thrombocytopenia as well. Blood alcohol dropped from 339-238 oh for 6-1/2 hours the patient still having significant ataxia. Sobriety will therefore be delayed as may therefore any withdrawal and hopefully benzodiazepines can be deferred and less subsequently definitely needed for withdrawal. Findings, diagnostic considerations and therapeutic interventions are confirmed as medically necessary and beneficial to patient.  Delight Hoh, MD

## 2014-02-27 NOTE — Plan of Care (Signed)
[name] Observation Crisis Plan  Reason for Crisis Plan:  Crisis Stabilization   Plan of Care:  Referral for Inpatient Hospitalization  Family Support:      Current Living Environment:  Living Arrangements: Non-relatives/Friends  Insurance:   Hospital Account   Name Acct ID Class Status Primary Coverage   Jose Krueger, Jose Krueger [id number] [place] Open None        Guarantor Account (for Hospital Account [id number])   Name Relation to Jose Krueger? Acct Type   Jose Krueger Self [facility] Yes Behavioral Health   Address Phone       [address] [place], [place] [address] [phone])          Coverage Information (for Hospital Account [id number])   Not on file      Legal Guardian:     Primary Care Jose Krueger:  No primary Jose Krueger on file.  Current Outpatient Providers:  none  Psychiatrist:     Counselor/Therapist:     Compliant with Medications:  No  Additional Information:   Jose Krueger 6/30/20153:53 PM

## 2014-02-27 NOTE — ED Provider Notes (Signed)
Medical screening examination/treatment/procedure(s) were performed by non-physician practitioner and as supervising physician I was immediately available for consultation/collaboration.   EKG Interpretation None        Wandra Arthurs, MD 02/27/14 [phone]
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
## 2014-02-27 NOTE — Progress Notes (Signed)
  CARE MANAGEMENT ED NOTE 02/27/2014  Patient:  CALEEL, KINER   Account Number:  [id number]  Date Initiated:  02/27/2014  Documentation initiated by:  Edwyna Shell  Subjective/Objective Assessment:   58 yo male presenting to the ED for detox from ETOH     Subjective/Objective Assessment Detail:     Action/Plan:   Provided patient with a list of local resources for medical care follow up if he remains in [facility]   Action/Plan Detail:   Anticipated DC Date:       Status Recommendation to Physician:   Result of Recommendation:  Agreed    [place]  CM consult  Other  PCP issues    Choice offered to / List presented to:  C-1 Patient          Status of service:  Completed, signed off  ED Comments:   ED Comments Detail:  This CM spoke with the patient regarding ED visit. He stated that he stays in [place], [place] with a friend and they gave him a ride here and that is why he is here. He stated that he is not sure if he is going to stay in [place] at a shelter or if he will go back to [place]. The patient stated that he does not have ID. This CM provided the patient with a 5 page resource guide and reviewed the information for [facility] and also [facility] regarding medical care and housing resources without the need of an ID. Encouraged the patient to go to his local health department if he does return to [place] to get connected with local resources there. Discussed the importance of a PCP to follow for treatment related to the cirrhosis. The patient verbalized understanding and had no further questions.

## 2014-02-28 ENCOUNTER — Encounter (HOSPITAL_COMMUNITY): Payer: Self-pay | Admitting: Emergency Medicine

## 2014-02-28 ENCOUNTER — Emergency Department (HOSPITAL_COMMUNITY)
Admission: EM | Admit: 2014-02-28 | Discharge: 2014-02-28 | Disposition: A | Discharge status: Home / Self Care | Payer: Self-pay | Attending: Emergency Medicine | Admitting: Emergency Medicine

## 2014-02-28 DIAGNOSIS — F332 Major depressive disorder, recurrent severe without psychotic features: Secondary | ICD-10-CM

## 2014-02-28 DIAGNOSIS — I81 Portal vein thrombosis: Secondary | ICD-10-CM | POA: Insufficient documentation

## 2014-02-28 DIAGNOSIS — Z862 Personal history of diseases of the blood and blood-forming organs and certain disorders involving the immune mechanism: Secondary | ICD-10-CM | POA: Insufficient documentation

## 2014-02-28 DIAGNOSIS — F1010 Alcohol abuse, uncomplicated: Secondary | ICD-10-CM | POA: Insufficient documentation

## 2014-02-28 DIAGNOSIS — F1994 Other psychoactive substance use, unspecified with psychoactive substance-induced mood disorder: Secondary | ICD-10-CM

## 2014-02-28 DIAGNOSIS — Z7982 Long term (current) use of aspirin: Secondary | ICD-10-CM | POA: Insufficient documentation

## 2014-02-28 DIAGNOSIS — Z8619 Personal history of other infectious and parasitic diseases: Secondary | ICD-10-CM | POA: Insufficient documentation

## 2014-02-28 DIAGNOSIS — F17200 Nicotine dependence, unspecified, uncomplicated: Secondary | ICD-10-CM | POA: Insufficient documentation

## 2014-02-28 DIAGNOSIS — Z8719 Personal history of other diseases of the digestive system: Secondary | ICD-10-CM | POA: Insufficient documentation

## 2014-02-28 NOTE — Progress Notes (Signed)
D: Pt. Interacting with staff and peers appropriately on unit.  Pt. Complains of some generalized discomfort. Scheduled medications and their expected effect discussed with pt. Pt. Denies SI, HI and AVH.  A: Will continue to reassess effectiveness of medication regimen and monitor for safety. Pt. Given emotional support from RN. Pt. Medication routine continued. Pt. Encouraged to come to me with questions.   R: Pt. Remains appropriate and cooperative. Will continue to monitor pt. For safety.

## 2014-02-28 NOTE — ED Notes (Signed)
Pt was returned here by his live-in girlfriend for detox. Pt refuses same at this time and wishes to go. For discharge per dr ward.

## 2014-02-28 NOTE — Progress Notes (Signed)
Patient ID: Jose Krueger, male   DOB: May 21, 1956, 58 y.o.   MRN: [id number] Pt trashing around in bed. Minimum sleep noted after medications. Pt denies pain or discomfort. Moderate tremors noted. See mar. Will continue to monitor closely.

## 2014-02-28 NOTE — ED Notes (Signed)
Think I need to go to rehab for alcohol per pt.
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
## 2014-02-28 NOTE — ED Provider Notes (Signed)
This chart was scribed for [place], DO by Lowella Petties, ED Scribe. The patient was seen in room APA15/APA15. Patient's care was started at 7:57 PM.   CHIEF COMPLAINT: Alcohol Problem  HPI: HPI Comments: Jose Krueger is a 58 y.o. male who presents to the Emergency Department complaining of an alcohol problem. He reports that he drank about one half of a 40 oz malt liquor beverage around 6:00 PM today. He was seen here earlier today for this problem, and was discharged with resources to pursue outpatient rehab at 4:35 PM. He states he is back because his girlfriend convinced him that he needs rehabilitation. Per behavioral health notes, patient was evaluated by multiple providers he did not feel he met criteria for inpatient treatment. He denies any SI or HI. No hallucinations. No medical complaints.   ROS: See HPI Constitutional: no fever Eyes: no drainage  ENT: no runny nose   Cardiovascular:  no chest pain  Resp: no SOB  GI: no vomiting GU: no dysuria Integumentary: no rash  Allergy: no hives  Musculoskeletal: no leg swelling  Neurological: no slurred speech ROS otherwise negative  PAST MEDICAL HISTORY/PAST SURGICAL HISTORY:  Past Medical History  Diagnosis Date  . Hepatitis C   . Cirrhosis   . Alcohol abuse   . Portal vein thrombosis   . Thrombocytopenia   . Pancytopenia   . Physiological tremor     MEDICATIONS:  Prior to Admission medications   Medication Sig Start Date End Date Taking? Authorizing Provider  aspirin 325 MG tablet Take 325 mg by mouth daily as needed for mild pain.    Historical Provider, MD    ALLERGIES:  No Known Allergies  SOCIAL HISTORY:  History  Substance Use Topics  . Smoking status: Current Every Day Smoker -- 0.50 packs/day    Types: Cigarettes  . Smokeless tobacco: Not on file  . Alcohol Use: 5.4 oz/week    4 Cans of beer, 5 Shots of liquor per week     Comment: drinks liqour and beer daily    FAMILY HISTORY: Family  History  Problem Relation Age of Onset  . Cancer Mother     type unknown  . Alcohol abuse Father   . Alcohol abuse Brother     EXAM: Triage Vitals: BP 103/63  Pulse 89  Temp(Src) 98.1 F (36.7 C) (Oral)  Resp 20  Ht 5' 7"  (1.702 m)  Wt 170 lb (77.111 kg)  BMI 26.62 kg/m2  SpO2 98% CONSTITUTIONAL: Alert and oriented and responds appropriately to questions. Well-appearing; well-nourished, in no apparent distress HEAD: Normocephalic EYES: Conjunctivae clear, PERRL ENT: normal nose; no rhinorrhea; moist mucous membranes; pharynx without lesions noted NECK: Supple, no meningismus, no LAD  CARD: RRR; S1 and S2 appreciated; no murmurs, no clicks, no rubs, no gallops RESP: Normal chest excursion without splinting or tachypnea; breath sounds clear and equal bilaterally; no wheezes, no rhonchi, no rales,  ABD/GI: Normal bowel sounds; non-distended; soft, non-tender, no rebound, no guarding BACK:  The back appears normal and is non-tender to palpation, there is no CVA tenderness EXT: Normal ROM in all joints; non-tender to palpation; no edema; normal capillary refill; no cyanosis    SKIN: Normal color for age and race; warm NEURO: Moves all extremities equally PSYCH: The patient's mood and manner are appropriate. Grooming and personal hygiene are appropriate.  MEDICAL DECISION MAKING: Patient here requesting rehabilitation for alcohol abuse. He was just here less than 4 hours ago for the  same and decided to go home and pursue outpatient rehabilitation. He states that his girlfriend picked him up and convinced him he needed to come back to the hospital. I have low suspicion that the patient will actually follow through with rehabilitation thinks he wants at this time. He is alert he been evaluated by behavioral health he was deemed that he does not meet any criteria for inpatient treatment. He does not meet inpatient detox and he has no current safety or medical concerns. I feel he is safe to be  discharged home. Have reassured with patient that he needs to followup with these rehabilitation facilities as an outpatient and take some initiative himself for outpatient treatment. Discussed supportive care instructions and return to questions. Patient verbalizes understanding and is comfortable with plan.    I personally performed the services described in this documentation, which was scribed in my presence. The recorded information has been reviewed and is accurate.     [place], DO 02/28/14 2018

## 2014-02-28 NOTE — Plan of Care (Signed)
[name]  Reason for Crisis Plan:  Substance Abuse   Plan of Care:  Referral for Substance Abuse  Family Support:    [name] (girlfriend) [phone]  Current Living Environment:  Living Arrangements: Non-relatives/Friends  Insurance:   Hospital Account   Name Acct ID Class Status Primary Coverage   Jose Krueger, Jose Krueger [id number] [place] Open None        Guarantor Account (for Hospital Account [id number])   Name Relation to Pt Service Area Active? Acct Type   Jose Krueger Self [facility] Yes Behavioral Health   Address Phone       [address] [place], [place] [address] [phone])          Coverage Information (for Hospital Account [id number])   Not on file      Legal Guardian:   Self  Primary Care Provider:  No primary provider on file.  Current Outpatient Providers:  None  Psychiatrist:   None  Counselor/Therapist:   None  Compliant with Medications:  Yes; pt is not currently on any prescription medications.  Additional Information: After consulting with Catalina Pizza, NP it has been determined that pt does not present a life threatening danger to himself or others, and that psychiatric hospitalization is not indicated for him at this time.  Pt is in need of outpatient substance abuse treatment resources.  In speaking to the pt he expresses a preference for receiving referrals to follow up on his own.  His discharge instructions will include referrals to [facility], to [facility], and to [facility].  Pt also reports that he does not have a primary care provider.  He will be referred to the [facility] to address these needs.   Jose Krueger 7/1/20152:22 PM

## 2014-02-28 NOTE — Discharge Instructions (Signed)
The following resources may be helpful to you in maintaining a sober lifestyle.  Call to schedule an appointment:       [facility]      [address] [place] [address]      [phone]       [facility]      [place], [place] [address]      [phone]       [facility]      [place], [place] [address]      [phone]  For your primary care medical needs, contact the [facility].  Call to schedule an appointment:       [facility]      [address] [place]      [phone]

## 2014-02-28 NOTE — Discharge Summary (Signed)
Jose Krueger is an 58 y.o. male.  Assessment: AXIS I:  Alcohol Abuse, Major Depression, Recurrent severe and Substance Induced Mood Disorder AXIS II:  Deferred AXIS III:   Past Medical History  Diagnosis Date  . Hepatitis C   . Cirrhosis   . Alcohol abuse   . Portal vein thrombosis   . Thrombocytopenia   . Pancytopenia   . Physiological tremor    AXIS IV:  problems related to legal system/crime and problems with access to health care services AXIS V:  61-70 mild symptoms  Plan: -Pt held in OBS UNIT overnight to observe for safety concerns with unsteady gait and tremor. Pt to discharge home with outpatient referrals.   Subjective:   Jose Krueger is a 58 y.o. male patient admitted with reports of drinking "as much vodka as I can". Pt seen and chart reviewed. Pt is well-known to this NP. Pt spent the night in the OBS UNIT and is doing much better today, anticipating discharge. Pt denies SI, HI, and AVH, contracts for safety. Pt reports resolution of physical withdrawal symptoms aside from mild tremor and mild diaphoresis.   HPI:  Pt is a [name] seeking etoh detox after drinking daily for more than 30 years. Pt is being reassessed for for current sx, and LOC needs. Pt is alert and oriented times 4, appears sleepy, and slightly disheveled. Pt reports he is eating and sleeping little in the hospital. He reports feeling his symptoms of depression and anxiety have increased since admission. Pt reports he feels sad and grumpy all the time. Pt indicated he is feeling anxious, and restless, unable to stay still. Pt is complaining of sweating a lot, headache, stomach ache, and nausea. Pt sweating was evident during assessment. Pt denies SI/HI, denies sx of psychosis.   HPI Elements:   Location:  Generalized, [facility]. Quality:  Worsening. Severity:  Severe. Timing:  Constant. Duration:  Chronic. Context:  Exacerbation of chronic alcoholism secondary to  life stressors..  Past Psychiatric History: Past Medical History  Diagnosis Date  . Hepatitis C   . Cirrhosis   . Alcohol abuse   . Portal vein thrombosis   . Thrombocytopenia   . Pancytopenia   . Physiological tremor     reports that he has been smoking Cigarettes.  He has been smoking about 0.50 packs per day. He does not have any smokeless tobacco history on file. He reports that he drinks about 5.4 ounces of alcohol per week. He reports that he does not use illicit drugs. Family History  Problem Relation Age of Onset  . Cancer Mother     type unknown  . Alcohol abuse Father   . Alcohol abuse Brother      Living Arrangements: Non-relatives/Friends   Allergies:  No Known Allergies  ACT Assessment Complete:  Yes:    Educational Status    Risk to Self: Risk to self Is patient at risk for suicide?: No Substance abuse history and/or treatment for substance abuse?: Yes  Risk to Others:    Abuse: Abuse/Neglect Assessment (Assessment to be complete while patient is alone) Physical Abuse: Denies Verbal Abuse: Denies Sexual Abuse: Denies Exploitation of patient/patient's resources: Denies Self-Neglect: Denies  Prior Inpatient Therapy:    Prior Outpatient Therapy:    Additional Information:      Objective: Blood pressure 104/70, pulse 67, temperature 98.2 F (36.8 C), temperature source Oral, resp. rate 16, height 5\' 7"  (1.702  m), weight 89.67 kg (197 lb 11 oz), SpO2 98.00%.Body mass index is 30.95 kg/(m^2). No results found for this or any previous visit (from the past 72 hour(s)). Labs are reviewed and are pertinent for elevated BAL, platelets go.   Current Facility-Administered Medications  Medication Dose Route Frequency Provider Last Rate Last Dose  . acetaminophen (TYLENOL) tablet 650 mg  650 mg Oral Q6H PRN Laverle Hobby, PA-C      . alum & mag hydroxide-simeth (MAALOX/MYLANTA) 200-200-20 MG/5ML suspension 30 mL  30 mL Oral Q4H PRN Laverle Hobby, PA-C      .  aspirin tablet 325 mg  325 mg Oral Daily PRN Laverle Hobby, PA-C      . chlordiazePOXIDE (LIBRIUM) capsule 25 mg  25 mg Oral Q6H PRN Laverle Hobby, PA-C      . chlordiazePOXIDE (LIBRIUM) capsule 25 mg  25 mg Oral QID Laverle Hobby, PA-C   25 mg at 02/28/14 1138   Followed by  . [START ON 03/01/2014] chlordiazePOXIDE (LIBRIUM) capsule 25 mg  25 mg Oral TID Laverle Hobby, PA-C       Followed by  . [START ON 03/02/2014] chlordiazePOXIDE (LIBRIUM) capsule 25 mg  25 mg Oral BH-qamhs Spencer E Simon, PA-C       Followed by  . [START ON 03/04/2014] chlordiazePOXIDE (LIBRIUM) capsule 25 mg  25 mg Oral Daily Laverle Hobby, PA-C      . hydrOXYzine (ATARAX/VISTARIL) tablet 25 mg  25 mg Oral Q6H PRN Laverle Hobby, PA-C   25 mg at 02/28/14 0108  . ibuprofen (ADVIL,MOTRIN) tablet 600 mg  600 mg Oral Q6H PRN Laverle Hobby, PA-C      . loperamide (IMODIUM) capsule 2-4 mg  2-4 mg Oral PRN Laverle Hobby, PA-C      . magnesium hydroxide (MILK OF MAGNESIA) suspension 30 mL  30 mL Oral Daily PRN Laverle Hobby, PA-C      . multivitamin with minerals tablet 1 tablet  1 tablet Oral Daily Laverle Hobby, PA-C   1 tablet at 02/28/14 9767  . ondansetron (ZOFRAN-ODT) disintegrating tablet 4 mg  4 mg Oral Q6H PRN Laverle Hobby, PA-C      . thiamine (B-1) injection 100 mg  100 mg Intramuscular Once [employer], PA-C      . thiamine (VITAMIN B-1) tablet 100 mg  100 mg Oral Daily Laverle Hobby, PA-C   100 mg at 02/28/14 3419  . traZODone (DESYREL) tablet 50 mg  50 mg Oral QHS,MR X 1 Spencer E Simon, PA-C   50 mg at 02/27/14 2240    Psychiatric Specialty Exam:     Blood pressure 104/70, pulse 67, temperature 98.2 F (36.8 C), temperature source Oral, resp. rate 16, height 5\' 7"  (1.702 m), weight 89.67 kg (197 lb 11 oz), SpO2 98.00%.Body mass index is 30.95 kg/(m^2).  General Appearance: Disheveled  Eye Sport and exercise psychologist::  Fair  Speech:  Slow  Volume:  Decreased  Mood:  Euthymic  Affect:  Congruent and Flat   Thought Process:  Goal Directed and Intact  Orientation:  Full (Time, Place, and Person)  Thought Content:  WDL  Suicidal Thoughts:  No  Homicidal Thoughts:  No  Memory:  Immediate;   Fair Recent;   Fair Remote;   Fair  Judgement:  Fair  Insight:  Fair  Psychomotor Activity:  Decreased  Concentration:  Fair  Recall:  Fair  Akathisia:  No  Handed:  AIMS (if indicated):     Assets:  Desire for Improvement Resilience Social Support  Sleep:      Treatment Plan Summary:  Pt held in OBS UNIT overnight to observe for safety concerns with unsteady gait and tremor. Pt to discharge home with outpatient referrals.   Disposition: See above.       Suicide Risk Assessment     Nursing information obtained from:    Demographic factors:    Current Mental Status:    Loss Factors:    Historical Factors:    Risk Reduction Factors:    Total Time spent with patient: 45 minutes  CLINICAL FACTORS:   Depression:   Anhedonia Comorbid alcohol abuse/dependence Impulsivity Insomnia Alcohol/Substance Abuse/Dependencies  Psychiatric Specialty Exam:     Blood pressure 104/70, pulse 67, temperature 98.2 F (36.8 C), temperature source Oral, resp. rate 16, height 5\' 7"  (1.702 m), weight 89.67 kg (197 lb 11 oz), SpO2 98.00%.Body mass index is 30.95 kg/(m^2).  SEE PSE ABOVE  COGNITIVE FEATURES THAT CONTRIBUTE TO RISK:  Closed-mindedness    SUICIDE RISK:   Minimal: No identifiable suicidal ideation.  Patients presenting with no risk factors but with morbid ruminations; may be classified as minimal risk based on the severity of the depressive symptoms  PLAN OF CARE: Pt to discharge home with outpatient referrals.    Benjamine Mola, FNP-BC 02/28/2014, 2:04 PM

## 2014-02-28 NOTE — ED Notes (Signed)
Pt's belonging checked by security.

## 2014-02-28 NOTE — Discharge Summary (Addendum)
Patient had withdrawal tremor through the night managed appropriately with benzodiazepine now asymptomatic for 6-12 hours by which patient and staff agree with findings, diagnostic considerations, and therapeutic interventions establishing readiness for community-based treatment understanding absolute need for sobriety medically.  Psychiatric supervisory review and face-to-face interview and exam facilitate consolidation of treatment need and planning matching.  Delight Hoh, MD

## 2014-02-28 NOTE — H&P (Signed)
Psychiatric supervisory review notes hepatitis C and alcoholic cirrhosis with little liver tissue by which to release hepatocellular enzymes the patient thereby having  lumbar liver function abnormalities requiring reduction in dose of medications metabolized hepatically and absolutely no alcohol. Detoxification and withdrawal treatment may thereby be complex and prolonged.  Delight Hoh, MD

## 2014-02-28 NOTE — Discharge Instructions (Signed)
You were seen today for alcohol abuse. Our psychiatry team saw the does not feel that you meet criteria for inpatient detox and recommend outpatient rehabilitation. This is something that you need to set up on your own and take responsibility for. They gave you multiple resources to contact.  Please contact these rehabilitation facilities for outpatient rehabilitation from alcohol. This is not something that you need to be admitted to the hospital for at this time.   Alcohol Use Disorder Alcohol use disorder is a mental disorder. It is not a one-time incident of heavy drinking. Alcohol use disorder is the excessive and uncontrollable use of alcohol over time that leads to problems with functioning in one or more areas of daily living. People with this disorder risk harming themselves and others when they drink to excess. Alcohol use disorder also can cause other mental disorders, such as mood and anxiety disorders, and serious physical problems. People with alcohol use disorder often misuse other drugs.  Alcohol use disorder is common and widespread. Some people with this disorder drink alcohol to cope with or escape from negative life events. Others drink to relieve chronic pain or symptoms of mental illness. People with a family history of alcohol use disorder are at higher risk of losing control and using alcohol to excess.  SYMPTOMS  Signs and symptoms of alcohol use disorder may include the following:   Consumption ofalcohol inlarger amounts or over a longer period of time than intended.  Multiple unsuccessful attempts to cutdown or control alcohol use.   A great deal of time spent obtaining alcohol, using alcohol, or recovering from the effects of alcohol (hangover).  A strong desire or urge to use alcohol (cravings).   Continued use of alcohol despite problems at work, school, or home because of alcohol use.   Continued use of alcohol despite problems in relationships because of  alcohol use.  Continued use of alcohol in situations when it is physically hazardous, such as driving a car.  Continued use of alcohol despite awareness of a physical or psychological problem that is likely related to alcohol use. Physical problems related to alcohol use can involve the brain, heart, liver, stomach, and intestines. Psychological problems related to alcohol use include intoxication, depression, anxiety, psychosis, delirium, and dementia.   The need for increased amounts of alcohol to achieve the same desired effect, or a decreased effect from the consumption of the same amount of alcohol (tolerance).  Withdrawal symptoms upon reducing or stopping alcohol use, or alcohol use to reduce or avoid withdrawal symptoms. Withdrawal symptoms include:  Racing heart.  Hand tremor.  Difficulty sleeping.  Nausea.  Vomiting.  Hallucinations.  Restlessness.  Seizures. DIAGNOSIS Alcohol use disorder is diagnosed through an assessment by your caregiver. Your caregiver may start by asking three or four questions to screen for excessive or problematic alcohol use. To confirm a diagnosis of alcohol use disorder, at least two symptoms (see SYMPTOMS) must be present within a 67-month period. The severity of alcohol use disorder depends on the number of symptoms:  Mild--two or three.  Moderate--four or five.  Severe--six or more. Your caregiver may perform a physical exam or use results from lab tests to see if you have physical problems resulting from alcohol use. Your caregiver may refer you to a mental health professional for evaluation. TREATMENT  Some people with alcohol use disorder are able to reduce their alcohol use to low-risk levels. Some people with alcohol use disorder need to quit drinking alcohol. When  necessary, mental health professionals with specialized training in substance use treatment can help. Your caregiver can help you decide how severe your alcohol use disorder  is and what type of treatment you need. The following forms of treatment are available:   Detoxification. Detoxification involves the use of prescription medication to prevent alcohol withdrawal symptoms in the first week after quitting. This is important for people with a history of symptoms of withdrawal and for heavy drinkers who are likely to have withdrawal symptoms. Alcohol withdrawal can be dangerous and, in severe cases, cause death. Detoxification is usually provided in a hospital or in-patient substance use treatment facility.  Counseling or talk therapy. Talk therapy is provided by substance use treatment counselors. It addresses the reasons people use alcohol and ways to keep them from drinking again. The goals of talk therapy are to help people with alcohol use disorder find healthy activities and ways to cope with life stress, to identify and avoid triggers for alcohol use, and to handle cravings, which can cause relapse.  Medication.Different medications can help treat alcohol use disorder through the following actions:  Decrease alcohol cravings.  Decrease the positive reward response felt from alcohol use.  Produce an uncomfortable physical reaction when alcohol is used (aversion therapy).  Support groups. Support groups are run by people who have quit drinking. They provide emotional support, advice, and guidance. These forms of treatment are often combined. Some people with alcohol use disorder benefit from intensive combination treatment provided by specialized substance use treatment centers. Both inpatient and outpatient treatment programs are available. Document Released: 09/24/2004 Document Revised: 04/19/2013 Document Reviewed: 11/24/2012 [facility] Patient Information 2015 [place], [place]. This information is not intended to replace advice given to you by your health care provider. Make sure you discuss any questions you have with your health care provider.    Emergency  Department Resource Guide 1) Find a Doctor and Pay Out of Pocket Although you won't have to find out who is covered by your insurance plan, it is a good idea to ask around and get recommendations. You will then need to call the office and see if the doctor you have chosen will accept you as a new patient and what types of options they offer for patients who are self-pay. Some doctors offer discounts or will set up payment plans for their patients who do not have insurance, but you will need to ask so you aren't surprised when you get to your appointment.  2) Contact Your Local Health Department Not all health departments have doctors that can see patients for sick visits, but many do, so it is worth a call to see if yours does. If you don't know where your local health department is, you can check in your phone book. The CDC also has a tool to help you locate your state's health department, and many state websites also have listings of all of their local health departments.  3) Find a [facility] If your illness is not likely to be very severe or complicated, you may want to try a walk in clinic. These are popping up all over the country in pharmacies, drugstores, and shopping centers. They're usually staffed by nurse practitioners or physician assistants that have been trained to treat common illnesses and complaints. They're usually fairly quick and inexpensive. However, if you have serious medical issues or chronic medical problems, these are probably not your best option.  No Primary Care Doctor: - Call Health Connect at  [phone] -  they can help you locate a primary care doctor that  accepts your insurance, provides certain services, etc. - Physician Referral Service- [phone]  Chronic Pain Problems: Organization         Address  Phone   Notes  [facility]  [phone] Patients need to be referred by their primary care doctor.   Medication  Assistance: Organization         Address  Phone   Notes  [facility] Medication Gastroenterology [address] [place], [place] [address] [phone] --Must be a resident of [facility] -- Must have NO insurance coverage whatsoever (no Medicaid/ Medicare, etc.) -- The pt. MUST have a primary care doctor that directs their care regularly and follows them in the community   [facility]  [phone]   [employer]  [phone]    Agencies that provide inexpensive medical care: Organization         Address  Phone   Notes  [place]  [phone]   [facility]    [phone]   [facility] [employer], [place] [address] [phone]   [place] [address], [place] [phone]   Planned Parenthood    [phone]   [facility]    [phone]   [place] and [place] [address], [place] Phone:  [phone], Fax:  [phone] Hours of Operation:  9 am - 6 pm, M-F.  Also accepts Medicaid/Medicare and self-pay.  [facility] for [place], [address], [place] Phone: [phone], Fax: [phone]. Hours of Operation:  8:30 am - 5:30 pm, M-F.  Also accepts Medicaid and self-pay.  [facility] [address], [place] Phone: [phone]   [place], [place], [place] [phone], Ext. [phone] Mondays & Thursdays: 7-9 AM.  First 15 patients are seen on a first come, first serve basis.    [place] Providers:  Organization         Address  Phone   Notes  [facility] [address], [place] [phone] Also accepts self-pay patients.  [facility] [address], [place]  [phone]   [place], [address], [place]  [phone]   [facility] [address] [place] [phone]   Lucianne Lei [address], [place]   [phone] Only accepts [place] Access [place] patients after they have their name applied to their card.   Self-Pay (no insurance) in [facility]:  Organization         Address  Phone   Notes  Sickle Cell Patients, [facility] [place] [phone]   [facility] [place] [phone]   [facility] [place]  [place], [place], [place] [phone]   [facility]/Dr. Osei-Bonsu  [address], [place] or [address], [place] [phone] Phone number for both [place] and [place] locations is the same.  [facility]gent [facility] and [facility] [address], [place] [phone]   [facility] [place] or [place]  [facility] [address] [phone]   [facility] [place] [phone], phone; [phone], fax Sees patients 1st and 3rd Saturday of every month.  Must not qualify for public or private insurance (i.e. Medicaid, Medicare, [place] Health Choice, Veterans' Benefits)  Household income should be no more than 200% of the poverty level The clinic cannot treat you if you are pregnant or think you are pregnant  Sexually transmitted diseases are not treated at the clinic.    Dental Care: Organization         Address  Phone  Notes  [facility] Department of [facility] [place] [phone] Accepts children up to age 69 who are enrolled in [place] or [place]; pregnant women with a Medicaid card; and children who have applied for Medicaid or [place] Health Choice, but were declined, whose parents can pay a reduced fee at time of service.  [facility]  Department of [facility]  [address], [place] [phone] Accepts children up to age 34 who are enrolled in [place] or [place]; pregnant women with a Medicaid card; and children who have applied for Medicaid or  Health Choice, but were declined, whose parents can pay a reduced fee at time of service.  [facility] Adult [facility] Access PROGRAM  [place] [phone] Patients are seen by appointment only. Walk-ins are not accepted. [place] will see patients 2 years of age and older. Monday - Tuesday (8am-5pm) Most Wednesdays (8:30-5pm) $30 per visit, cash only  [facility] Adult Dental Access PROGRAM  [address], [facility] [phone] Patients are seen by appointment only. Walk-ins are not accepted. [place] will see patients 29 years of age and older. One Wednesday Evening (Monthly: Volunteer Based).  $30 per visit, cash only  [place]  [phone] for adults; Children under age 50, call Graduate Pediatric Dentistry at [phone]. Children aged 88-14, please call [phone] to request a pediatric application.  Dental services are provided in all areas of dental care including fillings, crowns and bridges, complete and partial dentures, implants, gum treatment, root canals, and extractions. Preventive care is also provided. Treatment is provided to both adults and children. Patients are selected via a lottery and there is often a waiting list.   [facility] [address], [place]  [phone] [email or web address]   [facility] [address] [place], [place] [phone], Ext. [phone] Second and Fourth Thursday of each month, opens at 6:30 AM; Clinic ends at 9 AM.  Patients are seen on a first-come first-served basis, and a limited number are seen during each clinic.   [facility]  [address] Hillard Danker [place], [place] [phone]    Eligibility Requirements You must have lived in [place], [place], or [place] counties for at least the last three months.   You cannot be eligible for state or federal sponsored [employer], including [employer], [place], or [employer].   You generally cannot be eligible for healthcare insurance through your employer.    How to apply: Eligibility screenings are held every Tuesday and Wednesday afternoon from 1:00 pm until 4:00 pm. You do not need an appointment for the interview!  [facility] [address], [place], [place]   [place]  [place]  Health Department  [place]  [phone]    Behavioral Health Resources in the Community: Intensive Outpatient Programs Organization         Address  Phone  Notes  [place]. [address] [place], [place] [phone]   [facility] [address] [place], [place]   ADS: Alcohol & Drug S[facility] [address], [place], [place]   [place] [address]  [place], [place] or [phone]   Substance Abuse Resources Organization         Address  Phone  Notes  Alcohol and Drug Services  [phone]   [place]  [phone]   [facility] [place]   Chinita Pester  [phone]   Residential & Outpatient Substance Abuse Program  [phone]   Psychological Services Organization         Address  Phone  Notes  [facility] [place]  [place]  [phone]   [place] [address], [place] or [phone]    Mobile Crisis Teams Organization         Address  Phone  Notes  [facility], Mobile Crisis Care Unit  [phone]   [facility]  [address]  [place], [place]   [place] [address] [place] [phone]    Self-Help/Support Groups Organization         Address  Phone             Notes  [place]. of [place] - variety of support groups  [place] Call for more information  Narcotics Anonymous (NA), [facility]aring [facility] [address], [employer] [place]  2 meetings at this location   Special educational needs teacher         Address  Phone  Notes  [facility]idential Treatment [place],    [place]  [phone]   [facility]  [address] [place] [phone], [place], [place]   [place], [place] [phone] Admissions: 8am-3pm M-F  Incentives Substance [place] [address],    [place], [place] [phone]   [facility] [address] [place], [place], [place]   [facility] [address]  [place], [place]   [facility] - Intensive Outpatient [address], [place], [place]   [facility] [place].,  [place], [place] [phone] or [phone]   [facility] Services (RTS) [address], [place], [place] Accepts Medicaid  [facility] [place] [address],  [place] [phone] Substance Abuse/Addiction Treatment   [facility] Organization         Address  Phone  Notes  [facility]  [phone]   Domenic Schwab, PhD [address] Arlis Porta [place], [place]   [phone] or [phone]   [place], [place] [phone]   [place] [address], [place], [place] [phone] Insurance/Medicaid/sponsorship through [employer] and Families [address], [place] [address]  [address] [place], [place] [phone] [place], [place] [phone]    Dr. Adele Schilder  [phone]   [facility] Dept. 1) [address] [place] 2) [place] 3)  [place] [address], [place] [phone]  [phone]   [place] [phone] or [phone] (After Hours)

## 2014-02-28 NOTE — Progress Notes (Signed)
Patient ID: Jose Krueger, male   DOB: 28-Oct-1955, 58 y.o.   MRN: [id number] Discharge Note- Evaluated by Heloise Purpura NP and Dr. Creig Hines and decision made for him to be discharged today to home. Follow up and discharge information reviewed with patient and he expressed his understanding.He called his girlfriend, Jose Krueger, and she is willing to pick him up but has called back several times because she is hesitant about directions here. She does have GPS but not confident about it and as the very least needs someone here to reset it so she can return home. Told her we would be glad to do that for her. Asked that she be here by 1730 to pick him up.Called her and she states at 1600 she is 20 minutes out. Discharge completed and taken out to the lobby to wait on his girlfriend. Denies any thoughts to hurt self or others. Would like RX for his hepatitis, and back pain. Referred to the health dept in his county but he says that costs money and he doesn't work or have money. He is applying for disability but doesn't have it.Refered [facility] to [facility] [place] to follow up re his alcohol dependence, and suggested that he may be able to get help for him and his medications.

## 2014-03-31 DIAGNOSIS — T07XXXA Unspecified multiple injuries, initial encounter: Secondary | ICD-10-CM

## 2014-03-31 HISTORY — DX: Unspecified multiple injuries, initial encounter: T07.XXXA

## 2014-04-11 ENCOUNTER — Encounter (HOSPITAL_COMMUNITY): Payer: Self-pay | Admitting: Anesthesiology

## 2014-04-11 ENCOUNTER — Inpatient Hospital Stay (HOSPITAL_COMMUNITY): Payer: Medicaid Other

## 2014-04-11 ENCOUNTER — Emergency Department (HOSPITAL_COMMUNITY): Payer: Medicaid Other

## 2014-04-11 ENCOUNTER — Encounter (HOSPITAL_COMMUNITY): Payer: Self-pay | Admitting: Emergency Medicine

## 2014-04-11 ENCOUNTER — Inpatient Hospital Stay (HOSPITAL_COMMUNITY)
Admission: EM | Admit: 2014-04-11 | Discharge: 2014-04-30 | DRG: 957 | Disposition: A | Discharge status: Rehabilitation Facility | Payer: Medicaid Other | Attending: General Surgery | Admitting: General Surgery

## 2014-04-11 DIAGNOSIS — R4182 Altered mental status, unspecified: Secondary | ICD-10-CM | POA: Diagnosis present

## 2014-04-11 DIAGNOSIS — N390 Urinary tract infection, site not specified: Secondary | ICD-10-CM | POA: Diagnosis not present

## 2014-04-11 DIAGNOSIS — F10239 Alcohol dependence with withdrawal, unspecified: Secondary | ICD-10-CM | POA: Diagnosis present

## 2014-04-11 DIAGNOSIS — F17200 Nicotine dependence, unspecified, uncomplicated: Secondary | ICD-10-CM | POA: Diagnosis present

## 2014-04-11 DIAGNOSIS — R1310 Dysphagia, unspecified: Secondary | ICD-10-CM | POA: Diagnosis present

## 2014-04-11 DIAGNOSIS — S2249XA Multiple fractures of ribs, unspecified side, initial encounter for closed fracture: Secondary | ICD-10-CM

## 2014-04-11 DIAGNOSIS — R000 Tachycardia, unspecified: Secondary | ICD-10-CM | POA: Diagnosis not present

## 2014-04-11 DIAGNOSIS — S42009A Fracture of unspecified part of unspecified clavicle, initial encounter for closed fracture: Secondary | ICD-10-CM | POA: Diagnosis present

## 2014-04-11 DIAGNOSIS — S069X9A Unspecified intracranial injury with loss of consciousness of unspecified duration, initial encounter: Secondary | ICD-10-CM | POA: Diagnosis present

## 2014-04-11 DIAGNOSIS — K7469 Other cirrhosis of liver: Secondary | ICD-10-CM

## 2014-04-11 DIAGNOSIS — S2242XA Multiple fractures of ribs, left side, initial encounter for closed fracture: Secondary | ICD-10-CM | POA: Diagnosis present

## 2014-04-11 DIAGNOSIS — S42109A Fracture of unspecified part of scapula, unspecified shoulder, initial encounter for closed fracture: Secondary | ICD-10-CM

## 2014-04-11 DIAGNOSIS — S42199A Fracture of other part of scapula, unspecified shoulder, initial encounter for closed fracture: Secondary | ICD-10-CM | POA: Diagnosis present

## 2014-04-11 DIAGNOSIS — F1020 Alcohol dependence, uncomplicated: Secondary | ICD-10-CM | POA: Diagnosis present

## 2014-04-11 DIAGNOSIS — S066XAA Traumatic subarachnoid hemorrhage with loss of consciousness status unknown, initial encounter: Secondary | ICD-10-CM | POA: Diagnosis present

## 2014-04-11 DIAGNOSIS — G9340 Encephalopathy, unspecified: Secondary | ICD-10-CM | POA: Diagnosis present

## 2014-04-11 DIAGNOSIS — S27329A Contusion of lung, unspecified, initial encounter: Secondary | ICD-10-CM | POA: Diagnosis present

## 2014-04-11 DIAGNOSIS — S069XAA Unspecified intracranial injury with loss of consciousness status unknown, initial encounter: Secondary | ICD-10-CM | POA: Diagnosis present

## 2014-04-11 DIAGNOSIS — J942 Hemothorax: Secondary | ICD-10-CM

## 2014-04-11 DIAGNOSIS — J939 Pneumothorax, unspecified: Secondary | ICD-10-CM

## 2014-04-11 DIAGNOSIS — K7030 Alcoholic cirrhosis of liver without ascites: Secondary | ICD-10-CM | POA: Diagnosis present

## 2014-04-11 DIAGNOSIS — S0285XA Fracture of orbit, unspecified, initial encounter for closed fracture: Secondary | ICD-10-CM | POA: Diagnosis present

## 2014-04-11 DIAGNOSIS — D696 Thrombocytopenia, unspecified: Secondary | ICD-10-CM | POA: Diagnosis present

## 2014-04-11 DIAGNOSIS — S0280XA Fracture of other specified skull and facial bones, unspecified side, initial encounter for closed fracture: Secondary | ICD-10-CM | POA: Diagnosis present

## 2014-04-11 DIAGNOSIS — F1014 Alcohol abuse with alcohol-induced mood disorder: Secondary | ICD-10-CM

## 2014-04-11 DIAGNOSIS — B1920 Unspecified viral hepatitis C without hepatic coma: Secondary | ICD-10-CM | POA: Diagnosis present

## 2014-04-11 DIAGNOSIS — F1010 Alcohol abuse, uncomplicated: Secondary | ICD-10-CM

## 2014-04-11 DIAGNOSIS — S42002A Fracture of unspecified part of left clavicle, initial encounter for closed fracture: Secondary | ICD-10-CM | POA: Diagnosis present

## 2014-04-11 DIAGNOSIS — F10939 Alcohol use, unspecified with withdrawal, unspecified: Secondary | ICD-10-CM | POA: Diagnosis present

## 2014-04-11 DIAGNOSIS — S066X9A Traumatic subarachnoid hemorrhage with loss of consciousness of unspecified duration, initial encounter: Secondary | ICD-10-CM | POA: Diagnosis present

## 2014-04-11 DIAGNOSIS — IMO0002 Reserved for concepts with insufficient information to code with codable children: Secondary | ICD-10-CM | POA: Diagnosis present

## 2014-04-11 DIAGNOSIS — D62 Acute posthemorrhagic anemia: Secondary | ICD-10-CM | POA: Diagnosis not present

## 2014-04-11 DIAGNOSIS — E870 Hyperosmolality and hypernatremia: Secondary | ICD-10-CM | POA: Diagnosis not present

## 2014-04-11 DIAGNOSIS — J9383 Other pneumothorax: Secondary | ICD-10-CM | POA: Diagnosis present

## 2014-04-11 DIAGNOSIS — S42102A Fracture of unspecified part of scapula, left shoulder, initial encounter for closed fracture: Secondary | ICD-10-CM | POA: Diagnosis present

## 2014-04-11 LAB — I-STAT CHEM 8, ED
BUN: 5 mg/dL — ABNORMAL LOW (ref 6–23)
CHLORIDE: 106 meq/L (ref 96–112)
Calcium, Ion: 1.15 mmol/L (ref 1.12–1.23)
Creatinine, Ser: 0.6 mg/dL (ref 0.50–1.35)
GLUCOSE: 101 mg/dL — AB (ref 70–99)
HCT: 46 % (ref 39.0–52.0)
Hemoglobin: 15.6 g/dL (ref 13.0–17.0)
POTASSIUM: 3.4 meq/L — AB (ref 3.7–5.3)
Sodium: 142 mEq/L (ref 137–147)
TCO2: 21 mmol/L (ref 0–100)

## 2014-04-11 LAB — MRSA PCR SCREENING: MRSA BY PCR: NEGATIVE

## 2014-04-11 LAB — COMPREHENSIVE METABOLIC PANEL
ALT: 52 U/L (ref 0–53)
AST: 103 U/L — AB (ref 0–37)
Albumin: 2.9 g/dL — ABNORMAL LOW (ref 3.5–5.2)
Alkaline Phosphatase: 62 U/L (ref 39–117)
Anion gap: 14 (ref 5–15)
BUN: 7 mg/dL (ref 6–23)
CALCIUM: 8.6 mg/dL (ref 8.4–10.5)
CO2: 20 meq/L (ref 19–32)
Chloride: 107 mEq/L (ref 96–112)
Creatinine, Ser: 0.59 mg/dL (ref 0.50–1.35)
GFR calc Af Amer: >90 mL/min (ref >90)
GFR calc non Af Amer: >90 mL/min (ref >90)
Glucose, Bld: 101 mg/dL — ABNORMAL HIGH (ref 70–99)
Potassium: 3.8 mEq/L (ref 3.7–5.3)
SODIUM: 141 meq/L (ref 137–147)
Total Bilirubin: 1.1 mg/dL (ref 0.3–1.2)
Total Protein: 7.4 g/dL (ref 6.0–8.3)

## 2014-04-11 LAB — PROTIME-INR
INR: 1.21 (ref 0.00–1.49)
Prothrombin Time: 15.3 seconds — ABNORMAL HIGH (ref 11.6–15.2)

## 2014-04-11 LAB — CBC
HCT: 42.1 % (ref 39.0–52.0)
Hemoglobin: 14.4 g/dL (ref 13.0–17.0)
MCH: 34.2 pg — AB (ref 26.0–34.0)
MCHC: 34.2 g/dL (ref 30.0–36.0)
MCV: 100 fL (ref 78.0–100.0)
PLATELETS: 95 10*3/uL — AB (ref 150–400)
RBC: 4.21 MIL/uL — ABNORMAL LOW (ref 4.22–5.81)
RDW: 14.7 % (ref 11.5–15.5)
WBC: 5.2 10*3/uL (ref 4.0–10.5)

## 2014-04-11 LAB — CDS SEROLOGY

## 2014-04-11 LAB — SAMPLE TO BLOOD BANK

## 2014-04-11 LAB — ETHANOL: Alcohol, Ethyl (B): 11 mg/dL (ref 0–11)

## 2014-04-11 MED ORDER — ONDANSETRON HCL 4 MG PO TABS
4.0000 mg | ORAL_TABLET | Freq: Four times a day (QID) | ORAL | Status: DC | PRN
Start: 2014-04-11 — End: 2014-04-30

## 2014-04-11 MED ORDER — PANTOPRAZOLE SODIUM 40 MG PO TBEC: 40.0000 mg | DELAYED_RELEASE_TABLET | Freq: Every day | ORAL | Status: DC

## 2014-04-11 MED ORDER — SODIUM CHLORIDE 0.9 % IJ SOLN: 9.0000 mL | INTRAMUSCULAR | Status: DC | PRN

## 2014-04-11 MED ORDER — HYDROMORPHONE HCL PF 1 MG/ML IJ SOLN
1.0000 mg | Freq: Once | INTRAMUSCULAR | Status: AC
  Administered 2014-04-11: 1 mg via INTRAVENOUS
  Filled 2014-04-11: qty 1

## 2014-04-11 MED ORDER — DOCUSATE SODIUM 100 MG PO CAPS
100.0000 mg | ORAL_CAPSULE | Freq: Two times a day (BID) | ORAL | Status: DC
  Filled 2014-04-11 (×5): qty 1

## 2014-04-11 MED ORDER — PANTOPRAZOLE SODIUM 40 MG IV SOLR
40.0000 mg | Freq: Every day | INTRAVENOUS | Status: DC
  Administered 2014-04-12 – 2014-04-13 (×2): 40 mg via INTRAVENOUS
  Filled 2014-04-11 (×2): qty 40

## 2014-04-11 MED ORDER — POTASSIUM CHLORIDE IN NACL 40-0.9 MEQ/L-% IV SOLN
INTRAVENOUS | Status: DC
  Administered 2014-04-11: 75 mL/h via INTRAVENOUS
  Filled 2014-04-11 (×3): qty 1000

## 2014-04-11 MED ORDER — MORPHINE SULFATE (PF) 1 MG/ML IV SOLN
INTRAVENOUS | Status: DC
  Administered 2014-04-11: 23:00:00 via INTRAVENOUS
  Administered 2014-04-12: 3 mg via INTRAVENOUS
  Administered 2014-04-12: 7.5 mg via INTRAVENOUS
  Administered 2014-04-12: 3 mg via INTRAVENOUS
  Filled 2014-04-11: qty 25

## 2014-04-11 MED ORDER — FENTANYL CITRATE 0.05 MG/ML IJ SOLN
INTRAMUSCULAR | Status: AC
  Administered 2014-04-11: 100 ug via INTRAMUSCULAR
  Filled 2014-04-11: qty 2

## 2014-04-11 MED ORDER — FENTANYL CITRATE 0.05 MG/ML IJ SOLN
INTRAMUSCULAR | Status: AC
  Filled 2014-04-11: qty 2

## 2014-04-11 MED ORDER — DIPHENHYDRAMINE HCL 50 MG/ML IJ SOLN
12.5000 mg | Freq: Four times a day (QID) | INTRAMUSCULAR | Status: DC | PRN
Start: 2014-04-11 — End: 2014-04-12

## 2014-04-11 MED ORDER — FENTANYL CITRATE 0.05 MG/ML IJ SOLN
100.0000 ug | Freq: Once | INTRAMUSCULAR | Status: AC
  Administered 2014-04-11: 100 ug via INTRAMUSCULAR

## 2014-04-11 MED ORDER — PNEUMOCOCCAL VAC POLYVALENT 25 MCG/0.5ML IJ INJ
0.5000 mL | INJECTION | INTRAMUSCULAR | Status: AC
  Administered 2014-04-12: 0.5 mL via INTRAMUSCULAR
  Filled 2014-04-11: qty 0.5

## 2014-04-11 MED ORDER — FENTANYL CITRATE 0.05 MG/ML IJ SOLN
100.0000 ug | Freq: Once | INTRAMUSCULAR | Status: AC
  Administered 2014-04-11: 100 ug via INTRAVENOUS

## 2014-04-11 MED ORDER — DIPHENHYDRAMINE HCL 12.5 MG/5ML PO ELIX
12.5000 mg | ORAL_SOLUTION | Freq: Four times a day (QID) | ORAL | Status: DC | PRN
  Filled 2014-04-11: qty 5

## 2014-04-11 MED ORDER — LORAZEPAM 2 MG/ML IJ SOLN
1.0000 mg | INTRAMUSCULAR | Status: DC | PRN
  Administered 2014-04-12 – 2014-04-13 (×11): 2 mg via INTRAVENOUS
  Filled 2014-04-11 (×11): qty 1

## 2014-04-11 MED ORDER — NALOXONE HCL 0.4 MG/ML IJ SOLN: 0.4000 mg | INTRAMUSCULAR | Status: DC | PRN

## 2014-04-11 MED ORDER — ONDANSETRON HCL 4 MG/2ML IJ SOLN
4.0000 mg | Freq: Four times a day (QID) | INTRAMUSCULAR | Status: DC | PRN
  Administered 2014-04-25: 4 mg via INTRAVENOUS
  Filled 2014-04-11: qty 2

## 2014-04-11 MED ORDER — POLYETHYLENE GLYCOL 3350 17 G PO PACK
17.0000 g | PACK | Freq: Every day | ORAL | Status: DC
  Filled 2014-04-11 (×4): qty 1

## 2014-04-11 MED ORDER — IOHEXOL 300 MG/ML  SOLN
100.0000 mL | Freq: Once | INTRAMUSCULAR | Status: AC | PRN
  Administered 2014-04-11: 100 mL via INTRAVENOUS
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
## 2014-04-11 NOTE — H&P (Signed)
These cirrhotic patient with Significant injuries usually do not do well. He has evidence of advanced cirrhosis and a possible hepatocellular lesion in the right lobe of his liver. He has multiple left rib fractures but no noticeable pneumothorax on chest x-ray, however he does have a small to moderate size pneumothorax on CT scan of the chest. No chest 2 views B. Please correlate. Although the patient's INR is only 1.2 because of his liver disease he is at significant risk for bleeding from any invasive procedure. He will be admitted to the intensive care unit and placed on alcohol withdrawal protocol. The family notes that the patient is a severe alcoholic.  Anesthesia has been consulted for the possibility of placement of an epidural catheter. However because of his history of cirrhosis and liver diseas they may decline to perform the procedure. We will manage his pain and by PCA or the best other means.  This patient has been seen and I agree with the findings and treatment plan.  Kathryne Eriksson. Dahlia Bailiff, MD, [place] [phone] (pager) [phone] (direct pager) Trauma Surgeon

## 2014-04-11 NOTE — ED Notes (Signed)
Spoke with Ortho who will apply arm sling.

## 2014-04-11 NOTE — Progress Notes (Signed)
Anesthesiology:  While I was examining the patient, I noted that he had only a 20G antecubital IV that did not appear to be functioning. Decision made to insert a 3 lumen R. subclavian central line.  Full barrier precautions, 3 lumen 20 cm subclavian central line inserted without difficulty. 13 cc 1% lidocaine infiltrated. First pass, vein cannulated without difficulty. Catheter inserted over guidewire without difficulty. Secured at 18 cm at insertion site. Procedure well tolerated.  CXR pending.  Roberts Gaudy

## 2014-04-11 NOTE — ED Notes (Signed)
Warm blankets applied to patient.  
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
## 2014-04-11 NOTE — ED Notes (Signed)
ED resident placed IV and patient transported to CT.

## 2014-04-11 NOTE — ED Notes (Signed)
Ortho at bedside.

## 2014-04-11 NOTE — ED Notes (Signed)
Jose Krueger - Girlfriend - Work [phone] AND cell [phone]. House [phone]
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
## 2014-04-11 NOTE — Progress Notes (Signed)
Orthopedic Tech Progress Note Patient Details:  Jose Krueger 07/05/1956 [id number]  Ortho Devices Type of Ortho Device: Sling immobilizer Ortho Device/Splint Location: LUE Ortho Device/Splint Interventions: Ordered;Application   Braulio Bosch 04/11/2014, 6:16 PM
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
## 2014-04-11 NOTE — ED Notes (Signed)
Called CT patient ready for CT and has an IV established.

## 2014-04-12 ENCOUNTER — Inpatient Hospital Stay (HOSPITAL_COMMUNITY): Payer: Medicaid Other

## 2014-04-12 ENCOUNTER — Encounter (HOSPITAL_COMMUNITY): Payer: Self-pay

## 2014-04-12 DIAGNOSIS — D62 Acute posthemorrhagic anemia: Secondary | ICD-10-CM

## 2014-04-12 LAB — COMPREHENSIVE METABOLIC PANEL
ALBUMIN: 2.5 g/dL — AB (ref 3.5–5.2)
ALT: 42 U/L (ref 0–53)
ANION GAP: 8 (ref 5–15)
AST: 90 U/L — AB (ref 0–37)
Alkaline Phosphatase: 48 U/L (ref 39–117)
BILIRUBIN TOTAL: 1 mg/dL (ref 0.3–1.2)
BUN: 18 mg/dL (ref 6–23)
CHLORIDE: 109 meq/L (ref 96–112)
CO2: 21 mEq/L (ref 19–32)
Calcium: 7.6 mg/dL — ABNORMAL LOW (ref 8.4–10.5)
Creatinine, Ser: 0.87 mg/dL (ref 0.50–1.35)
GFR calc Af Amer: >90 mL/min (ref >90)
GFR calc non Af Amer: >90 mL/min (ref >90)
Glucose, Bld: 114 mg/dL — ABNORMAL HIGH (ref 70–99)
Potassium: 6.1 mEq/L — ABNORMAL HIGH (ref 3.7–5.3)
SODIUM: 138 meq/L (ref 137–147)
TOTAL PROTEIN: 6.2 g/dL (ref 6.0–8.3)

## 2014-04-12 LAB — CBC
HCT: 32 % — ABNORMAL LOW (ref 39.0–52.0)
Hemoglobin: 10.9 g/dL — ABNORMAL LOW (ref 13.0–17.0)
MCH: 33.1 pg (ref 26.0–34.0)
MCHC: 34.1 g/dL (ref 30.0–36.0)
MCV: 97.3 fL (ref 78.0–100.0)
PLATELETS: 98 10*3/uL — AB (ref 150–400)
RBC: 3.29 MIL/uL — ABNORMAL LOW (ref 4.22–5.81)
RDW: 14.6 % (ref 11.5–15.5)
WBC: 6.2 10*3/uL (ref 4.0–10.5)

## 2014-04-12 LAB — PROTIME-INR
INR: 1.3 (ref 0.00–1.49)
Prothrombin Time: 16.2 seconds — ABNORMAL HIGH (ref 11.6–15.2)

## 2014-04-12 LAB — BASIC METABOLIC PANEL
ANION GAP: 10 (ref 5–15)
BUN: 23 mg/dL (ref 6–23)
CALCIUM: 7.8 mg/dL — AB (ref 8.4–10.5)
CO2: 20 meq/L (ref 19–32)
Chloride: 110 mEq/L (ref 96–112)
Creatinine, Ser: 0.89 mg/dL (ref 0.50–1.35)
GFR calc non Af Amer: >90 mL/min (ref >90)
Glucose, Bld: 103 mg/dL — ABNORMAL HIGH (ref 70–99)
Potassium: 5.1 mEq/L (ref 3.7–5.3)
SODIUM: 140 meq/L (ref 137–147)

## 2014-04-12 LAB — AMMONIA: Ammonia: 58 umol/L (ref 11–60)

## 2014-04-12 MED ORDER — SODIUM CHLORIDE 0.9 % IV SOLN
INTRAVENOUS | Status: DC
  Administered 2014-04-12: 10:00:00 via INTRAVENOUS
  Filled 2014-04-12 (×2): qty 1000

## 2014-04-12 MED ORDER — CETYLPYRIDINIUM CHLORIDE 0.05 % MT LIQD
7.0000 mL | Freq: Two times a day (BID) | OROMUCOSAL | Status: DC
  Administered 2014-04-13 – 2014-04-30 (×29): 7 mL via OROMUCOSAL

## 2014-04-12 MED ORDER — SODIUM CHLORIDE 0.9 % IV SOLN
INTRAVENOUS | Status: DC
  Administered 2014-04-12 – 2014-04-16 (×6): via INTRAVENOUS

## 2014-04-12 MED ORDER — CHLORHEXIDINE GLUCONATE 0.12 % MT SOLN
15.0000 mL | Freq: Two times a day (BID) | OROMUCOSAL | Status: DC
  Administered 2014-04-12 – 2014-04-30 (×34): 15 mL via OROMUCOSAL
  Filled 2014-04-12 (×33): qty 15

## 2014-04-12 MED ORDER — MORPHINE SULFATE 2 MG/ML IJ SOLN
2.0000 mg | INTRAMUSCULAR | Status: DC | PRN
  Administered 2014-04-12 – 2014-04-15 (×15): 2 mg via INTRAVENOUS
  Filled 2014-04-12 (×16): qty 1

## 2014-04-12 NOTE — Progress Notes (Signed)
Rehab Admissions Coordinator Note:  Patient was screened by Cleatrice Burke for appropriateness for an Inpatient Acute Rehab Consult per PT recommendation. At this time, we are recommending Inpatient Rehab consult. Please place order when felt appropriate.   Cleatrice Burke 04/12/2014, 4:02 PM  I can be reached at [phone].
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
## 2014-04-12 NOTE — Progress Notes (Signed)
UR completed.  Leocadio Heal, RN BSN MHA CCM Trauma/Neuro ICU Case Manager [phone]  
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
## 2014-04-12 NOTE — Progress Notes (Signed)
No significant change in status. Patient remains somnolent and minimally verbal. Will follow some simple commands with great stimulation.  Followup head CT scan demonstrates stable appearance of his frontal contusions without significant enlargement or complicating features.  Status post closed head injury complicated by severe alcohol abuse. Overall stable from my standpoint. Continue supportive care.
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
## 2014-04-12 NOTE — Progress Notes (Signed)
This patient is going to be difficult to manage.  Will be aggressive with withdrawal medications since we know his head CT is not that much worse.  This patient has been seen and I agree with the findings and treatment plan.  Kathryne Eriksson. Dahlia Bailiff, MD, [facility] [phone] (pager) [phone] (direct pager) Trauma Surgeon

## 2014-04-12 NOTE — Evaluation (Signed)
Speech Language Pathology Evaluation Patient Details Name: Jose Krueger MRN: [id number] DOB: 1956-03-05 Today's Date: 04/12/2014 Time: 9798-9211 SLP Time Calculation (min): 38 min  Problem List:  Patient Active Problem List   Diagnosis Date Noted  . TBI (traumatic brain injury) 04/11/2014  . Alcohol abuse with alcohol-induced mood disorder 02/27/2014  . Depression 02/10/2011  . Hepatitis C 02/10/2011  . Alcohol abuse 02/10/2011  . Portal vein thrombosis 02/10/2011  . Cirrhosis of liver 02/10/2011  . Thrombocytopenia 02/10/2011  . Lower back pain 02/10/2011   Past Medical History:  Past Medical History  Diagnosis Date  . Hepatitis C   . Cirrhosis   . Alcohol abuse   . Portal vein thrombosis   . Thrombocytopenia   . Pancytopenia   . Physiological tremor    Past Surgical History: History reviewed. No pertinent past surgical history. HPI:  See TBI team eval  Assessment / Plan / Recommendation Clinical Impression  Patient presents with behaviors consistent with a Rancho Level III (localized response)/Rancho Level IV (confused, agitated) once aroused by SLP. Patient with two brief periods of sustained attention in which able to follow basic command with max contextual cueing, hightened state of anxiety with attempts to get out of bed, and  incoherent verbal output. Difficult to determine extent of effect of alcohol on current presentation. SLP will continue to f/u to maximize cognitive recovery in the setting of a healing brain.     SLP Assessment  Patient needs continued Speech Lanaguage Pathology Services    Follow Up Recommendations  Inpatient Rehab    Frequency and Duration min 3x week  2 weeks   Pertinent Vitals/Pain Pain Assessment: Faces Faces Pain Scale: Hurts whole lot Pain Intervention(s): Monitored during session;Repositioned;RN gave pain meds during session   SLP Goals  SLP Goals Potential to Achieve Goals: Good Potential Considerations:  Co-morbidities  SLP Evaluation Prior Functioning  Cognitive/Linguistic Baseline: Information not available   Cognition  Overall Cognitive Status: Impaired/Different from baseline Arousal/Alertness:  (Initially lethargic, aroused with tactile cueing) Orientation Level: Oriented to person;Disoriented to place;Disoriented to time;Disoriented to situation Attention: Focused Focused Attention: Impaired Focused Attention Impairment: Verbal basic Memory: Impaired Memory Impairment: Storage deficit Awareness: Impaired Awareness Impairment: Intellectual impairment Problem Solving: Impaired Problem Solving Impairment: Verbal basic;Functional basic Behaviors: Restless;Impulsive Safety/Judgment: Impaired Comments: poor safety awareness Rancho [employer] Scales of Cognitive Functioning: Confused/agitated (Rancho III/IV )    Comprehension  Auditory Comprehension Overall Auditory Comprehension: Impaired Yes/No Questions: Not tested Commands: Impaired One Step Basic Commands: 0-24% accurate Visual Recognition/Discrimination Discrimination: Not tested Reading Comprehension Reading Status: Not tested    Expression Expression Primary Mode of Expression: Verbal Verbal Expression Overall Verbal Expression: Other (comment) (limited incoherent verbal output) Written Expression Dominant Hand: Right   Oral / Motor Oral Motor/Sensory Function Overall Oral Motor/Sensory Function:  (unable to formally assess due to mentation) Motor Speech Overall Motor Speech:  (incoherent speech due to cogntiive status)   GO    Gabriel Rainwater MA, CCC-SLP [phone]  Jose Krueger 04/12/2014, 10:29 AM
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
## 2014-04-12 NOTE — Plan of Care (Signed)
Problem: Phase I Progression Outcomes Goal: Adequate ventilation/oxygenation Outcome: Completed/Met Date Met:  04/12/14 Patient is on room air Goal: Voiding-avoid urinary catheter unless indicated Outcome: Completed/Met Date Met:  04/12/14 Patient has condom catheter

## 2014-04-13 ENCOUNTER — Inpatient Hospital Stay (HOSPITAL_COMMUNITY): Payer: Medicaid Other

## 2014-04-13 DIAGNOSIS — K7030 Alcoholic cirrhosis of liver without ascites: Secondary | ICD-10-CM

## 2014-04-13 DIAGNOSIS — S2249XA Multiple fractures of ribs, unspecified side, initial encounter for closed fracture: Secondary | ICD-10-CM

## 2014-04-13 DIAGNOSIS — K7460 Unspecified cirrhosis of liver: Secondary | ICD-10-CM

## 2014-04-13 DIAGNOSIS — F10988 Alcohol use, unspecified with other alcohol-induced disorder: Secondary | ICD-10-CM

## 2014-04-13 DIAGNOSIS — J9383 Other pneumothorax: Secondary | ICD-10-CM

## 2014-04-13 LAB — CBC
HCT: 30.1 % — ABNORMAL LOW (ref 39.0–52.0)
HEMOGLOBIN: 10.3 g/dL — AB (ref 13.0–17.0)
MCH: 34.8 pg — ABNORMAL HIGH (ref 26.0–34.0)
MCHC: 34.2 g/dL (ref 30.0–36.0)
MCV: 101.7 fL — ABNORMAL HIGH (ref 78.0–100.0)
Platelets: 90 10*3/uL — ABNORMAL LOW (ref 150–400)
RBC: 2.96 MIL/uL — ABNORMAL LOW (ref 4.22–5.81)
RDW: 15 % (ref 11.5–15.5)
WBC: 6.5 10*3/uL (ref 4.0–10.5)

## 2014-04-13 LAB — BASIC METABOLIC PANEL
ANION GAP: 11 (ref 5–15)
BUN: 19 mg/dL (ref 6–23)
CO2: 22 mEq/L (ref 19–32)
Calcium: 8.2 mg/dL — ABNORMAL LOW (ref 8.4–10.5)
Chloride: 109 mEq/L (ref 96–112)
Creatinine, Ser: 0.71 mg/dL (ref 0.50–1.35)
GFR calc Af Amer: >90 mL/min (ref >90)
GLUCOSE: 111 mg/dL — AB (ref 70–99)
Potassium: 4.5 mEq/L (ref 3.7–5.3)
SODIUM: 142 meq/L (ref 137–147)

## 2014-04-13 LAB — GLUCOSE, CAPILLARY
GLUCOSE-CAPILLARY: 105 mg/dL — AB (ref 70–99)
Glucose-Capillary: 90 mg/dL (ref 70–99)
Glucose-Capillary: 82 mg/dL (ref 70–99)

## 2014-04-13 LAB — AMMONIA: AMMONIA: 39 umol/L (ref 11–60)

## 2014-04-13 MED ORDER — LORAZEPAM 2 MG/ML IJ SOLN
1.0000 mg | INTRAMUSCULAR | Status: DC | PRN
  Administered 2014-04-13 – 2014-04-14 (×2): 1 mg via INTRAVENOUS
  Filled 2014-04-13 (×2): qty 1

## 2014-04-13 MED ORDER — DEXMEDETOMIDINE HCL IN NACL 200 MCG/50ML IV SOLN
0.4000 ug/kg/h | INTRAVENOUS | Status: DC
  Administered 2014-04-13: 0.1 ug/kg/h via INTRAVENOUS
  Administered 2014-04-13 – 2014-04-14 (×3): 0.4 ug/kg/h via INTRAVENOUS
  Administered 2014-04-14: 0.3 ug/kg/h via INTRAVENOUS
  Administered 2014-04-15: 0.4 ug/kg/h via INTRAVENOUS
  Filled 2014-04-13 (×6): qty 50

## 2014-04-13 MED ORDER — PANTOPRAZOLE SODIUM 40 MG PO PACK
40.0000 mg | PACK | Freq: Every day | ORAL | Status: DC
  Administered 2014-04-14 – 2014-04-23 (×9): 40 mg
  Filled 2014-04-13 (×12): qty 20

## 2014-04-13 MED ORDER — PIVOT 1.5 CAL PO LIQD
1000.0000 mL | ORAL | Status: DC
  Administered 2014-04-13 – 2014-04-20 (×10): 1000 mL
  Filled 2014-04-13 (×19): qty 1000
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
## 2014-04-13 NOTE — Progress Notes (Signed)
I saw and examined the patient with Mr. Jose Krueger, communicating the findings and plan noted above.  Continue nonsurgical management.  Altamese [place], MD [facility] [phone] (p)
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
## 2014-04-14 ENCOUNTER — Inpatient Hospital Stay (HOSPITAL_COMMUNITY): Payer: Medicaid Other

## 2014-04-14 DIAGNOSIS — K7030 Alcoholic cirrhosis of liver without ascites: Secondary | ICD-10-CM

## 2014-04-14 DIAGNOSIS — F1010 Alcohol abuse, uncomplicated: Secondary | ICD-10-CM

## 2014-04-14 LAB — BLOOD GAS, ARTERIAL
Acid-Base Excess: 0.4 mmol/L (ref 0.0–2.0)
BICARBONATE: 24.7 meq/L — AB (ref 20.0–24.0)
Drawn by: 313941
O2 Content: 2 L/min
O2 Saturation: 91.4 %
PH ART: 7.387 (ref 7.350–7.450)
Patient temperature: 99.3
TCO2: 26 mmol/L (ref 0–100)
pCO2 arterial: 42.3 mmHg (ref 35.0–45.0)
pO2, Arterial: 64.1 mmHg — ABNORMAL LOW (ref 80.0–100.0)

## 2014-04-14 LAB — URINALYSIS, ROUTINE W REFLEX MICROSCOPIC
Glucose, UA: NEGATIVE mg/dL
Hgb urine dipstick: NEGATIVE
Ketones, ur: 15 mg/dL — AB
Nitrite: POSITIVE — AB
PROTEIN: NEGATIVE mg/dL
SPECIFIC GRAVITY, URINE: 1.027 (ref 1.005–1.030)
UROBILINOGEN UA: 1 mg/dL (ref 0.0–1.0)
pH: 5.5 (ref 5.0–8.0)

## 2014-04-14 LAB — CBC WITH DIFFERENTIAL/PLATELET
Basophils Absolute: 0 10*3/uL (ref 0.0–0.1)
Basophils Relative: 1 % (ref 0–1)
Eosinophils Absolute: 0 10*3/uL (ref 0.0–0.7)
Eosinophils Relative: 0 % (ref 0–5)
HEMATOCRIT: 27.9 % — AB (ref 39.0–52.0)
HEMOGLOBIN: 9.2 g/dL — AB (ref 13.0–17.0)
LYMPHS PCT: 25 % (ref 12–46)
Lymphs Abs: 1.3 10*3/uL (ref 0.7–4.0)
MCH: 34.1 pg — ABNORMAL HIGH (ref 26.0–34.0)
MCHC: 33 g/dL (ref 30.0–36.0)
MCV: 103.3 fL — ABNORMAL HIGH (ref 78.0–100.0)
MONO ABS: 1 10*3/uL (ref 0.1–1.0)
MONOS PCT: 19 % — AB (ref 3–12)
NEUTROS ABS: 2.8 10*3/uL (ref 1.7–7.7)
Neutrophils Relative %: 55 % (ref 43–77)
Platelets: 89 10*3/uL — ABNORMAL LOW (ref 150–400)
RBC: 2.7 MIL/uL — ABNORMAL LOW (ref 4.22–5.81)
RDW: 15.1 % (ref 11.5–15.5)
WBC: 5.1 10*3/uL (ref 4.0–10.5)

## 2014-04-14 LAB — GLUCOSE, CAPILLARY
GLUCOSE-CAPILLARY: 119 mg/dL — AB (ref 70–99)
GLUCOSE-CAPILLARY: 138 mg/dL — AB (ref 70–99)
Glucose-Capillary: 119 mg/dL — ABNORMAL HIGH (ref 70–99)
Glucose-Capillary: 131 mg/dL — ABNORMAL HIGH (ref 70–99)
Glucose-Capillary: 137 mg/dL — ABNORMAL HIGH (ref 70–99)
Glucose-Capillary: 144 mg/dL — ABNORMAL HIGH (ref 70–99)

## 2014-04-14 LAB — URINE MICROSCOPIC-ADD ON

## 2014-04-14 LAB — BASIC METABOLIC PANEL
ANION GAP: 8 (ref 5–15)
BUN: 18 mg/dL (ref 6–23)
CHLORIDE: 116 meq/L — AB (ref 96–112)
CO2: 23 mEq/L (ref 19–32)
CREATININE: 0.76 mg/dL (ref 0.50–1.35)
Calcium: 8.4 mg/dL (ref 8.4–10.5)
GFR calc Af Amer: >90 mL/min (ref >90)
GFR calc non Af Amer: >90 mL/min (ref >90)
Glucose, Bld: 150 mg/dL — ABNORMAL HIGH (ref 70–99)
Potassium: 4.9 mEq/L (ref 3.7–5.3)
Sodium: 147 mEq/L (ref 137–147)

## 2014-04-14 MED ORDER — THIAMINE HCL 100 MG/ML IJ SOLN
Freq: Once | INTRAVENOUS | Status: AC
  Administered 2014-04-14: 11:00:00 via INTRAVENOUS
  Filled 2014-04-14: qty 1000

## 2014-04-14 MED ORDER — LACTULOSE 10 GM/15ML PO SOLN
20.0000 g | Freq: Two times a day (BID) | ORAL | Status: DC
  Administered 2014-04-14 (×2): 20 g via ORAL
  Filled 2014-04-14 (×5): qty 30

## 2014-04-14 NOTE — Progress Notes (Signed)
Still not responding to voice commands. Otherwise stable  Imogene Burn. Georgette Dover, MD, [facility]  General/ Trauma Surgery  04/14/2014 2:27 PM
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
## 2014-04-15 ENCOUNTER — Inpatient Hospital Stay (HOSPITAL_COMMUNITY): Payer: Medicaid Other

## 2014-04-15 LAB — GLUCOSE, CAPILLARY
GLUCOSE-CAPILLARY: 132 mg/dL — AB (ref 70–99)
GLUCOSE-CAPILLARY: 144 mg/dL — AB (ref 70–99)
GLUCOSE-CAPILLARY: 123 mg/dL — AB (ref 70–99)
Glucose-Capillary: 127 mg/dL — ABNORMAL HIGH (ref 70–99)
Glucose-Capillary: 121 mg/dL — ABNORMAL HIGH (ref 70–99)

## 2014-04-15 LAB — CBC WITH DIFFERENTIAL/PLATELET
BASOS ABS: 0.1 10*3/uL (ref 0.0–0.1)
Basophils Relative: 1 % (ref 0–1)
EOS ABS: 0.1 10*3/uL (ref 0.0–0.7)
Eosinophils Relative: 2 % (ref 0–5)
HEMATOCRIT: 26.8 % — AB (ref 39.0–52.0)
Hemoglobin: 9 g/dL — ABNORMAL LOW (ref 13.0–17.0)
LYMPHS ABS: 1.3 10*3/uL (ref 0.7–4.0)
Lymphocytes Relative: 27 % (ref 12–46)
MCH: 34 pg (ref 26.0–34.0)
MCHC: 33.6 g/dL (ref 30.0–36.0)
MCV: 101.1 fL — AB (ref 78.0–100.0)
MONO ABS: 1.1 10*3/uL — AB (ref 0.1–1.0)
MONOS PCT: 22 % — AB (ref 3–12)
NEUTROS ABS: 2.3 10*3/uL (ref 1.7–7.7)
Neutrophils Relative %: 48 % (ref 43–77)
PLATELETS: 94 10*3/uL — AB (ref 150–400)
RBC: 2.65 MIL/uL — ABNORMAL LOW (ref 4.22–5.81)
RDW: 15 % (ref 11.5–15.5)
WBC Morphology: INCREASED
WBC: 4.9 10*3/uL (ref 4.0–10.5)

## 2014-04-15 LAB — COMPREHENSIVE METABOLIC PANEL
ALK PHOS: 45 U/L (ref 39–117)
ALT: 29 U/L (ref 0–53)
AST: 68 U/L — AB (ref 0–37)
Albumin: 2.2 g/dL — ABNORMAL LOW (ref 3.5–5.2)
Anion gap: 5 (ref 5–15)
BILIRUBIN TOTAL: 1.8 mg/dL — AB (ref 0.3–1.2)
BUN: 18 mg/dL (ref 6–23)
CHLORIDE: 115 meq/L — AB (ref 96–112)
CO2: 26 meq/L (ref 19–32)
CREATININE: 0.56 mg/dL (ref 0.50–1.35)
Calcium: 9.1 mg/dL (ref 8.4–10.5)
GFR calc Af Amer: >90 mL/min (ref >90)
Glucose, Bld: 134 mg/dL — ABNORMAL HIGH (ref 70–99)
POTASSIUM: 4 meq/L (ref 3.7–5.3)
Sodium: 146 mEq/L (ref 137–147)
Total Protein: 5.9 g/dL — ABNORMAL LOW (ref 6.0–8.3)

## 2014-04-15 MED ORDER — LACTULOSE 10 GM/15ML PO SOLN
30.0000 g | Freq: Three times a day (TID) | ORAL | Status: DC
  Filled 2014-04-15 (×2): qty 45

## 2014-04-15 MED ORDER — LACTULOSE 10 GM/15ML PO SOLN
30.0000 g | Freq: Three times a day (TID) | ORAL | Status: DC
  Administered 2014-04-15 – 2014-04-30 (×36): 30 g via ORAL
  Filled 2014-04-15 (×52): qty 45

## 2014-04-15 MED ORDER — INSULIN ASPART 100 UNIT/ML ~~LOC~~ SOLN
0.0000 [IU] | SUBCUTANEOUS | Status: DC
  Administered 2014-04-15 – 2014-04-17 (×10): 1 [IU] via SUBCUTANEOUS

## 2014-04-15 MED ORDER — MORPHINE SULFATE 4 MG/ML IJ SOLN
4.0000 mg | INTRAMUSCULAR | Status: DC | PRN
  Administered 2014-04-15 – 2014-04-16 (×3): 4 mg via INTRAVENOUS
  Filled 2014-04-15 (×3): qty 1

## 2014-04-15 MED ORDER — RISPERIDONE 1 MG/ML PO SOLN
1.0000 mg | Freq: Two times a day (BID) | ORAL | Status: DC
  Administered 2014-04-15 – 2014-04-19 (×9): 1 mg via ORAL
  Filled 2014-04-15 (×14): qty 1

## 2014-04-15 MED ORDER — LORAZEPAM 1 MG PO TABS
2.0000 mg | ORAL_TABLET | Freq: Two times a day (BID) | ORAL | Status: DC
  Administered 2014-04-15 (×2): 2 mg via ORAL
  Filled 2014-04-15 (×2): qty 2
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
## 2014-04-16 ENCOUNTER — Inpatient Hospital Stay (HOSPITAL_COMMUNITY): Payer: Medicaid Other

## 2014-04-16 DIAGNOSIS — S270XXA Traumatic pneumothorax, initial encounter: Secondary | ICD-10-CM

## 2014-04-16 LAB — BASIC METABOLIC PANEL WITH GFR
Anion gap: 7 (ref 5–15)
BUN: 19 mg/dL (ref 6–23)
CO2: 27 meq/L (ref 19–32)
Calcium: 10.1 mg/dL (ref 8.4–10.5)
Chloride: 117 meq/L — ABNORMAL HIGH (ref 96–112)
Creatinine, Ser: 0.61 mg/dL (ref 0.50–1.35)
GFR calc Af Amer: >90 mL/min
GFR calc non Af Amer: >90 mL/min
Glucose, Bld: 131 mg/dL — ABNORMAL HIGH (ref 70–99)
Potassium: 3.9 meq/L (ref 3.7–5.3)
Sodium: 151 meq/L — ABNORMAL HIGH (ref 137–147)

## 2014-04-16 LAB — GLUCOSE, CAPILLARY
GLUCOSE-CAPILLARY: 121 mg/dL — AB (ref 70–99)
GLUCOSE-CAPILLARY: 119 mg/dL — AB (ref 70–99)
GLUCOSE-CAPILLARY: 143 mg/dL — AB (ref 70–99)
Glucose-Capillary: 121 mg/dL — ABNORMAL HIGH (ref 70–99)
Glucose-Capillary: 122 mg/dL — ABNORMAL HIGH (ref 70–99)
Glucose-Capillary: 129 mg/dL — ABNORMAL HIGH (ref 70–99)
Glucose-Capillary: 142 mg/dL — ABNORMAL HIGH (ref 70–99)

## 2014-04-16 LAB — AMMONIA: Ammonia: 60 umol/L (ref 11–60)

## 2014-04-16 MED ORDER — LORAZEPAM 1 MG PO TABS: 0.0000 mg | ORAL_TABLET | Freq: Two times a day (BID) | ORAL | Status: DC

## 2014-04-16 MED ORDER — ADULT MULTIVITAMIN W/MINERALS CH
1.0000 | ORAL_TABLET | Freq: Every day | ORAL | Status: DC
  Administered 2014-04-16 – 2014-04-30 (×11): 1 via ORAL
  Filled 2014-04-16 (×16): qty 1

## 2014-04-16 MED ORDER — LORAZEPAM 1 MG PO TABS
1.0000 mg | ORAL_TABLET | Freq: Four times a day (QID) | ORAL | Status: AC | PRN
  Administered 2014-04-18: 1 mg
  Filled 2014-04-16: qty 1

## 2014-04-16 MED ORDER — LORAZEPAM 1 MG PO TABS: 0.0000 mg | ORAL_TABLET | Freq: Four times a day (QID) | ORAL | Status: DC

## 2014-04-16 MED ORDER — THIAMINE HCL 100 MG/ML IJ SOLN
100.0000 mg | Freq: Every day | INTRAMUSCULAR | Status: DC
  Administered 2014-04-19 – 2014-04-22 (×2): 100 mg via INTRAVENOUS
  Filled 2014-04-16 (×4): qty 1

## 2014-04-16 MED ORDER — VITAMIN B-1 100 MG PO TABS
100.0000 mg | ORAL_TABLET | Freq: Every day | ORAL | Status: DC
  Administered 2014-04-16 – 2014-04-30 (×11): 100 mg via ORAL
  Filled 2014-04-16 (×15): qty 1

## 2014-04-16 MED ORDER — LORAZEPAM 2 MG/ML IJ SOLN
1.0000 mg | Freq: Four times a day (QID) | INTRAMUSCULAR | Status: AC | PRN
  Administered 2014-04-17: 1 mg via INTRAVENOUS
  Filled 2014-04-16 (×2): qty 1

## 2014-04-16 MED ORDER — SODIUM CHLORIDE 0.9 % IV BOLUS (SEPSIS)
500.0000 mL | Freq: Once | INTRAVENOUS | Status: AC
  Administered 2014-04-16: 500 mL via INTRAVENOUS

## 2014-04-16 MED ORDER — FOLIC ACID 1 MG PO TABS
1.0000 mg | ORAL_TABLET | Freq: Every day | ORAL | Status: DC
  Administered 2014-04-16 – 2014-04-30 (×13): 1 mg via ORAL
  Filled 2014-04-16 (×16): qty 1

## 2014-04-16 MED ORDER — HYDROCODONE-ACETAMINOPHEN 7.5-325 MG/15ML PO SOLN
10.0000 mL | ORAL | Status: DC | PRN
  Administered 2014-04-16 – 2014-04-19 (×14): 10 mL
  Filled 2014-04-16 (×14): qty 15

## 2014-04-16 NOTE — Progress Notes (Signed)
[facility] Progress Note Patient Name: Jose Krueger DOB: [birth year] MRN: [id number]   Date of Service  04/16/2014  HPI/Events of Note  s tach, fever, ? dry  eICU Interventions  Ice packs NS bolus     Intervention Category Intermediate Interventions: Arrhythmia - evaluation and management  Lamonte Hartt V. 04/16/2014, 2:40 AM
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
## 2014-04-16 NOTE — Consult Note (Signed)
CC time 45 min.  Patient seen and examined, agree with above note.  I dictated the care and orders written for this patient under my direction.  Rush Farmer, MD [phone]

## 2014-04-16 NOTE — Progress Notes (Signed)
Notified Dr. Elsworth Soho of pt's continued 101.3 axillary temp. Discussed pt's vitals, course of care, interventions. Orders received, will continue to monitor.
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
## 2014-04-16 NOTE — Progress Notes (Signed)
UR completed.  Therapies recommending CIR at d/c.   Sandi Mariscal, RN BSN [place] CCM Trauma/Neuro ICU Case Manager [phone]
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
## 2014-04-17 ENCOUNTER — Inpatient Hospital Stay (HOSPITAL_COMMUNITY): Payer: Medicaid Other

## 2014-04-17 DIAGNOSIS — D62 Acute posthemorrhagic anemia: Secondary | ICD-10-CM | POA: Diagnosis not present

## 2014-04-17 DIAGNOSIS — S0285XA Fracture of orbit, unspecified, initial encounter for closed fracture: Secondary | ICD-10-CM | POA: Diagnosis present

## 2014-04-17 DIAGNOSIS — S42002A Fracture of unspecified part of left clavicle, initial encounter for closed fracture: Secondary | ICD-10-CM | POA: Diagnosis present

## 2014-04-17 DIAGNOSIS — N390 Urinary tract infection, site not specified: Secondary | ICD-10-CM | POA: Diagnosis not present

## 2014-04-17 DIAGNOSIS — S069XAA Unspecified intracranial injury with loss of consciousness status unknown, initial encounter: Secondary | ICD-10-CM

## 2014-04-17 DIAGNOSIS — E870 Hyperosmolality and hypernatremia: Secondary | ICD-10-CM

## 2014-04-17 DIAGNOSIS — S069X9A Unspecified intracranial injury with loss of consciousness of unspecified duration, initial encounter: Secondary | ICD-10-CM

## 2014-04-17 DIAGNOSIS — S42102A Fracture of unspecified part of scapula, left shoulder, initial encounter for closed fracture: Secondary | ICD-10-CM | POA: Diagnosis present

## 2014-04-17 DIAGNOSIS — S2242XA Multiple fractures of ribs, left side, initial encounter for closed fracture: Secondary | ICD-10-CM | POA: Diagnosis present

## 2014-04-17 LAB — URINE MICROSCOPIC-ADD ON

## 2014-04-17 LAB — URINALYSIS, ROUTINE W REFLEX MICROSCOPIC
Bilirubin Urine: NEGATIVE
GLUCOSE, UA: NEGATIVE mg/dL
Hgb urine dipstick: NEGATIVE
KETONES UR: NEGATIVE mg/dL
Leukocytes, UA: NEGATIVE
Nitrite: POSITIVE — AB
PROTEIN: NEGATIVE mg/dL
Specific Gravity, Urine: 1.027 (ref 1.005–1.030)
Urobilinogen, UA: 2 mg/dL — ABNORMAL HIGH (ref 0.0–1.0)
pH: 8 (ref 5.0–8.0)

## 2014-04-17 LAB — GLUCOSE, CAPILLARY
GLUCOSE-CAPILLARY: 126 mg/dL — AB (ref 70–99)
GLUCOSE-CAPILLARY: 118 mg/dL — AB (ref 70–99)

## 2014-04-17 MED ORDER — FREE WATER
200.0000 mL | Freq: Three times a day (TID) | Status: DC
  Administered 2014-04-17 – 2014-04-18 (×4): 200 mL

## 2014-04-17 MED ORDER — CIPROFLOXACIN IN D5W 400 MG/200ML IV SOLN
400.0000 mg | Freq: Two times a day (BID) | INTRAVENOUS | Status: AC
  Administered 2014-04-17 – 2014-04-19 (×6): 400 mg via INTRAVENOUS
  Filled 2014-04-17 (×6): qty 200

## 2014-04-17 MED ORDER — AMANTADINE HCL 50 MG/5ML PO SYRP
100.0000 mg | ORAL_SOLUTION | Freq: Two times a day (BID) | ORAL | Status: DC
  Administered 2014-04-17 – 2014-04-23 (×12): 100 mg
  Filled 2014-04-17 (×17): qty 10
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
## 2014-04-17 NOTE — Progress Notes (Addendum)
Pt received Ativan for MRI. Per staff, after Ativan pt unable to lie flat to complete MRI. MD aware.
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
## 2014-04-17 NOTE — Progress Notes (Signed)
Patient moans, does not follow commands.  Not oriented.  May be able to go to SDU tomorrow..  This patient has been seen and I agree with the findings and treatment plan.  Kathryne Eriksson. Dahlia Bailiff, MD, [place] [phone] (pager) [phone] (direct pager) Trauma Surgeon

## 2014-04-18 DIAGNOSIS — S06330A Contusion and laceration of cerebrum, unspecified, without loss of consciousness, initial encounter: Secondary | ICD-10-CM

## 2014-04-18 LAB — BASIC METABOLIC PANEL
ANION GAP: 5 (ref 5–15)
BUN: 22 mg/dL (ref 6–23)
CALCIUM: 9.4 mg/dL (ref 8.4–10.5)
CHLORIDE: 116 meq/L — AB (ref 96–112)
CO2: 29 meq/L (ref 19–32)
Creatinine, Ser: 0.61 mg/dL (ref 0.50–1.35)
GFR calc non Af Amer: >90 mL/min (ref >90)
Glucose, Bld: 106 mg/dL — ABNORMAL HIGH (ref 70–99)
Potassium: 4.2 mEq/L (ref 3.7–5.3)
Sodium: 150 mEq/L — ABNORMAL HIGH (ref 137–147)

## 2014-04-18 MED ORDER — FREE WATER
200.0000 mL | Freq: Four times a day (QID) | Status: DC
  Administered 2014-04-18 – 2014-04-21 (×12): 200 mL

## 2014-04-18 NOTE — Progress Notes (Signed)
Trauma Service Note  Subjective: Patient is still not very responsive.  Will follow some commands.  Not able to eat yet  Objective: Vital signs in last 24 hours: Temp:  [97.8 F (36.6 C)-99.4 F (37.4 C)] 97.8 F (36.6 C) (08/19 0800) Pulse Rate:  [96-122] 119 (08/19 0800) Resp:  [13-19] 16 (08/19 0800) BP: (109-152)/(55-91) 115/63 mmHg (08/19 0800) SpO2:  [93 %-100 %] 96 % (08/19 0800) FiO2 (%):  [2 %] 2 % (08/18 1230) Weight:  [87.1 kg (192 lb 0.3 oz)] 87.1 kg (192 lb 0.3 oz) (08/19 0500) Last BM Date: 04/16/14  Intake/Output from previous day: 08/18 0701 - 08/19 0700 In: 2400 [NG/GT:2000; IV Piggyback:400] Out: 1165 [Urine:1165] Intake/Output this shift: Total I/O In: 330 [NG/GT:130; IV Piggyback:200] Out: -   General: No distress.  Will open eyes only  Lungs: Clear  Abd: Benign  Extremities: No additional problems  Neuro: Not as somnolent.  Still not alert GCS 12  Lab Results: CBC   Recent Labs  04/16/14 0535  WBC 5.2  HGB 9.6*  HCT 29.2*  PLT 99*   BMET  Recent Labs  04/16/14 0535 04/18/14 0345  NA 151* 150*  K 3.9 4.2  CL 117* 116*  CO2 27 29  GLUCOSE 131* 106*  BUN 19 22  CREATININE 0.61 0.61  CALCIUM 10.1 9.4   PT/INR No results found for this basename: LABPROT, INR,  in the last 72 hours ABG No results found for this basename: PHART, PCO2, PO2, HCO3,  in the last 72 hours  Studies/Results: No results found.  Anti-infectives: Anti-infectives   Start     Dose/Rate Route Frequency Ordered Stop   04/17/14 1000  ciprofloxacin (CIPRO) IVPB 400 mg     400 mg 200 mL/hr over 60 Minutes Intravenous Every 12 hours 04/17/14 0908 04/20/14 0959      Assessment/Plan: s/p  ST evaluation Keep in unit for now.  LOS: 7 days   Kathryne Eriksson. Dahlia Bailiff, MD, FACS [phone] Trauma Surgeon 04/18/2014
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
## 2014-04-18 NOTE — Progress Notes (Signed)
Rehab admissions - Evaluated for possible admission.  Please see rehab consult done by Dr. Naaman Plummer on 08/18.  I will contact girlfriend and whatever family I can to determine if there is a discharge plan after a potential inpatient rehab stay.  Not ready today for inpatient rehab admission.  Call me for questions.  #[phone]
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
## 2014-04-19 LAB — BASIC METABOLIC PANEL
Anion gap: 6 (ref 5–15)
BUN: 22 mg/dL (ref 6–23)
CALCIUM: 8.8 mg/dL (ref 8.4–10.5)
CO2: 28 meq/L (ref 19–32)
Chloride: 114 mEq/L — ABNORMAL HIGH (ref 96–112)
Creatinine, Ser: 0.56 mg/dL (ref 0.50–1.35)
GFR calc Af Amer: >90 mL/min (ref >90)
GFR calc non Af Amer: >90 mL/min (ref >90)
GLUCOSE: 79 mg/dL (ref 70–99)
POTASSIUM: 4.4 meq/L (ref 3.7–5.3)
SODIUM: 148 meq/L — AB (ref 137–147)

## 2014-04-19 MED ORDER — LORAZEPAM 2 MG/ML IJ SOLN
1.0000 mg | INTRAMUSCULAR | Status: DC | PRN
  Administered 2014-04-19 – 2014-04-20 (×3): 2 mg via INTRAVENOUS
  Filled 2014-04-19 (×3): qty 1
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
## 2014-04-19 NOTE — Progress Notes (Addendum)
Patient ID: Jose Krueger, male   DOB: 11-08-1955, 58 y.o.   MRN: [id number]    Subjective: More alert, C/O some L shoulder pain  Objective: Vital signs in last 24 hours: Temp:  [97.3 F (36.3 C)-98.8 F (37.1 C)] 98.8 F (37.1 C) (08/20 0720) Pulse Rate:  [93-110] 99 (08/20 0700) Resp:  [12-20] 17 (08/20 0700) BP: (95-155)/(39-99) 132/80 mmHg (08/20 0700) SpO2:  [95 %-99 %] 99 % (08/20 0700) Weight:  [193 lb 2 oz (87.6 kg)] 193 lb 2 oz (87.6 kg) (08/20 0600) Last BM Date: 04/16/14  Intake/Output from previous day: 08/19 0701 - 08/20 0700 In: 2630 [NG/GT:2230; IV Piggyback:400] Out: 785 [Urine:785] Intake/Output this shift:    General appearance: cooperative Neck: no posterior midline TTP, no pain on AROM, collar removed Resp: clear after cough Cardio: regular rate and rhythm GI: soft, NT, ND Extremities: sling LUE, abrasion L arm Neuro: answers questions, F/C well  Lab Results: CBC  No results found for this basename: WBC, HGB, HCT, PLT,  in the last 72 hours BMET  Recent Labs  04/18/14 0345  NA 150*  K 4.2  CL 116*  CO2 29  GLUCOSE 106*  BUN 22  CREATININE 0.61  CALCIUM 9.4   PT/INR No results found for this basename: LABPROT, INR,  in the last 72 hours ABG No results found for this basename: PHART, PCO2, PO2, HCO3,  in the last 72 hours  Studies/Results: No results found.  Anti-infectives: Anti-infectives   Start     Dose/Rate Route Frequency Ordered Stop   04/17/14 1000  ciprofloxacin (CIPRO) IVPB 400 mg     400 mg 200 mL/hr over 60 Minutes Intravenous Every 12 hours 04/17/14 0908 04/20/14 0959      Assessment/Plan: [facility]  TBI -- exam is improved, on amantadine. TBI team therapies Left orbit fx -- Nondisplaced  Left clav/scap fxs -- Sling, per Dr. Marcelino Scot non-op management for now Multiple left rib fxs -- Pulmonary toilet  ABL anemia -- F/U AM Thrombocytopenia -- F/U AM ID -- completing Cipro for UTI EtOH abuse -- CIWA  Cirrhosis/hepatic  mass -- MRI w/wo contrast once able to follow directions well  FEN -- free water for hypernatremia adjusted 8/19. Check BMET in AM  VTE -- SCD's  Dispo -- ICU today, hope to transfer out tomorrow, appreciate CIR eval   LOS: 8 days    Georganna Skeans, MD, MPH, FACS Trauma: [phone] General Surgery: [phone]  04/19/2014

## 2014-04-19 NOTE — Progress Notes (Signed)
UR completed. Planned for CIR at d/c.   Sandi Mariscal, RN BSN [place] CCM Trauma/Neuro ICU Case Manager [phone]

## 2014-04-19 NOTE — Progress Notes (Signed)
OT Cancellation Note  Patient Details Name: Jose Krueger MRN: [id number] DOB: Aug 04, 1956   Cancelled Treatment:    Reason Eval/Treat Not Completed: Other (comment) Pt apparently agitated this pm and given Ativan. Will see in am. [place], OTR/L  [phone] 04/19/2014 04/19/2014, 5:38 PM
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
## 2014-04-19 NOTE — Progress Notes (Signed)
Rehab admissions - I spoke with patient's girlfriend, [name].  She tells me that she and her daughter/son can provide supervision and assistance for patient after a potential inpatient rehab stay.  Patient was staying with her most of the time PTA.  Patient did have medicaid at one time.  Girlfriend is interested in applying for medicaid and disability for patient.  Following progress for now.  Call me for questions.  #[phone]

## 2014-04-20 ENCOUNTER — Inpatient Hospital Stay (HOSPITAL_COMMUNITY): Payer: Medicaid Other

## 2014-04-20 LAB — CBC WITH DIFFERENTIAL/PLATELET
BASOS ABS: 0 10*3/uL (ref 0.0–0.1)
BASOS PCT: 0 % (ref 0–1)
Band Neutrophils: 0 % (ref 0–10)
Blasts: 0 %
EOS ABS: 0 10*3/uL (ref 0.0–0.7)
Eosinophils Relative: 0 % (ref 0–5)
HCT: 29.2 % — ABNORMAL LOW (ref 39.0–52.0)
Hemoglobin: 9.6 g/dL — ABNORMAL LOW (ref 13.0–17.0)
Lymphocytes Relative: 35 % (ref 12–46)
Lymphs Abs: 1.8 10*3/uL (ref 0.7–4.0)
MCH: 34 pg (ref 26.0–34.0)
MCHC: 32.9 g/dL (ref 30.0–36.0)
MCV: 103.5 fL — AB (ref 78.0–100.0)
METAMYELOCYTES PCT: 0 %
MYELOCYTES: 0 %
Monocytes Absolute: 1.7 10*3/uL — ABNORMAL HIGH (ref 0.1–1.0)
Monocytes Relative: 33 % — ABNORMAL HIGH (ref 3–12)
NEUTROS ABS: 1.7 10*3/uL (ref 1.7–7.7)
Neutrophils Relative %: 32 % — ABNORMAL LOW (ref 43–77)
PROMYELOCYTES ABS: 0 %
Platelets: 99 10*3/uL — ABNORMAL LOW (ref 150–400)
RBC: 2.82 MIL/uL — ABNORMAL LOW (ref 4.22–5.81)
RDW: 15.6 % — ABNORMAL HIGH (ref 11.5–15.5)
WBC Morphology: INCREASED
WBC: 5.2 10*3/uL (ref 4.0–10.5)
nRBC: 0 /100 WBC

## 2014-04-20 LAB — BASIC METABOLIC PANEL
Anion gap: 8 (ref 5–15)
BUN: 20 mg/dL (ref 6–23)
CO2: 24 meq/L (ref 19–32)
CREATININE: 0.57 mg/dL (ref 0.50–1.35)
Calcium: 8.9 mg/dL (ref 8.4–10.5)
Chloride: 115 mEq/L — ABNORMAL HIGH (ref 96–112)
GFR calc Af Amer: >90 mL/min (ref >90)
GFR calc non Af Amer: >90 mL/min (ref >90)
Glucose, Bld: 83 mg/dL (ref 70–99)
Potassium: 4.3 mEq/L (ref 3.7–5.3)
Sodium: 147 mEq/L (ref 137–147)

## 2014-04-20 LAB — CBC
HCT: 28.5 % — ABNORMAL LOW (ref 39.0–52.0)
HEMOGLOBIN: 9.5 g/dL — AB (ref 13.0–17.0)
MCH: 34.4 pg — ABNORMAL HIGH (ref 26.0–34.0)
MCHC: 33.3 g/dL (ref 30.0–36.0)
MCV: 103.3 fL — AB (ref 78.0–100.0)
Platelets: 101 10*3/uL — ABNORMAL LOW (ref 150–400)
RBC: 2.76 MIL/uL — ABNORMAL LOW (ref 4.22–5.81)
RDW: 17 % — ABNORMAL HIGH (ref 11.5–15.5)
WBC: 8.9 10*3/uL (ref 4.0–10.5)

## 2014-04-20 MED ORDER — LORAZEPAM 1 MG PO TABS
2.0000 mg | ORAL_TABLET | ORAL | Status: DC | PRN
  Filled 2014-04-20: qty 2

## 2014-04-20 MED ORDER — OXYCODONE HCL 5 MG/5ML PO SOLN
5.0000 mg | ORAL | Status: DC | PRN
  Administered 2014-04-20: 5 mg via ORAL
  Administered 2014-04-21 – 2014-04-26 (×4): 10 mg via ORAL
  Filled 2014-04-20: qty 5
  Filled 2014-04-20 (×4): qty 10

## 2014-04-20 MED ORDER — RISPERIDONE 1 MG/ML PO SOLN
2.0000 mg | Freq: Two times a day (BID) | ORAL | Status: DC
  Administered 2014-04-20 – 2014-04-23 (×6): 2 mg
  Filled 2014-04-20 (×11): qty 2

## 2014-04-20 MED ORDER — LORAZEPAM 2 MG/ML PO CONC: 2.0000 mg | ORAL | Status: DC | PRN
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
## 2014-04-20 NOTE — Progress Notes (Signed)
Trauma Service Note  Subjective: Patient very somnolent this AM.  No acute distress.  Grimaces when moved about.  Objective: Vital signs in last 24 hours: Temp:  [98.7 F (37.1 C)-99.7 F (37.6 C)] 98.7 F (37.1 C) (08/21 0357) Pulse Rate:  [90-103] 101 (08/21 0700) Resp:  [12-29] 23 (08/21 0700) BP: (102-165)/(57-112) 102/66 mmHg (08/21 0700) SpO2:  [93 %-100 %] 95 % (08/21 0700) Weight:  [86.8 kg (191 lb 5.8 oz)] 86.8 kg (191 lb 5.8 oz) (08/21 0500) Last BM Date: 04/16/14  Intake/Output from previous day: 08/20 0701 - 08/21 0700 In: 1830 [NG/GT:1430; IV Piggyback:400] Out: 6387 [Urine:1025] Intake/Output this shift:    General: Sleepy, pulled out [place] earlier.  Lungs: Coarse on the right side.  Sats are good.    Abd: Benign.  Had been tolerating tube feedings well until he pulled it out.  Extremities: No changes.  Neuro: Somnolent, but will follow some commands  Lab Results: CBC   Recent Labs  04/20/14 0640  WBC 8.9  HGB 9.5*  HCT 28.5*  PLT 101*   BMET  Recent Labs  04/19/14 1500 04/20/14 0640  NA 148* 147  K 4.4 4.3  CL 114* 115*  CO2 28 24  GLUCOSE 79 83  BUN 22 20  CREATININE 0.56 0.57  CALCIUM 8.8 8.9   PT/INR No results found for this basename: LABPROT, INR,  in the last 72 hours ABG No results found for this basename: PHART, PCO2, PO2, HCO3,  in the last 72 hours  Studies/Results: No results found.  Anti-infectives: Anti-infectives   Start     Dose/Rate Route Frequency Ordered Stop   04/17/14 1000  ciprofloxacin (CIPRO) IVPB 400 mg     400 mg 200 mL/hr over 60 Minutes Intravenous Every 12 hours 04/17/14 0908 04/20/14 0011      Assessment/Plan: s/p  Continue to work with therapies. No distress. Consider transfer to SDU.  LOS: 9 days   Kathryne Eriksson. Dahlia Bailiff, MD, FACS [phone] Trauma Surgeon 04/20/2014
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
## 2014-04-21 ENCOUNTER — Inpatient Hospital Stay (HOSPITAL_COMMUNITY): Payer: Medicaid Other

## 2014-04-21 DIAGNOSIS — R509 Fever, unspecified: Secondary | ICD-10-CM

## 2014-04-21 LAB — URINALYSIS, ROUTINE W REFLEX MICROSCOPIC
BILIRUBIN URINE: NEGATIVE
GLUCOSE, UA: NEGATIVE mg/dL
Hgb urine dipstick: NEGATIVE
Ketones, ur: 15 mg/dL — AB
Leukocytes, UA: NEGATIVE
Nitrite: NEGATIVE
PROTEIN: NEGATIVE mg/dL
Specific Gravity, Urine: 1.034 — ABNORMAL HIGH (ref 1.005–1.030)
Urobilinogen, UA: 2 mg/dL — ABNORMAL HIGH (ref 0.0–1.0)
pH: 6 (ref 5.0–8.0)

## 2014-04-21 MED ORDER — DEXTROSE 5 % IV SOLN
1.0000 g | Freq: Three times a day (TID) | INTRAVENOUS | Status: DC
  Administered 2014-04-21 – 2014-04-25 (×12): 1 g via INTRAVENOUS
  Filled 2014-04-21 (×15): qty 1

## 2014-04-21 MED ORDER — VANCOMYCIN HCL 10 G IV SOLR
1500.0000 mg | Freq: Once | INTRAVENOUS | Status: AC
  Administered 2014-04-21: 1500 mg via INTRAVENOUS
  Filled 2014-04-21: qty 1500

## 2014-04-21 MED ORDER — VANCOMYCIN HCL IN DEXTROSE 1-5 GM/200ML-% IV SOLN
1000.0000 mg | Freq: Three times a day (TID) | INTRAVENOUS | Status: DC
  Administered 2014-04-21 (×2): 1000 mg via INTRAVENOUS
  Filled 2014-04-21 (×6): qty 200
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
## 2014-04-21 NOTE — Progress Notes (Signed)
ANTIBIOTIC CONSULT NOTE - INITIAL  Pharmacy Consult for Vancomycin and Cefepime  Indication: rule out pneumonia  No Known Allergies  Patient Measurements: Height: 5\' 6"  (167.6 cm) Weight: 193 lb 5.5 oz (87.7 kg) IBW/kg (Calculated) : 63.8  Vital Signs: Temp: 101.7 F (38.7 C) (08/22 0554) Temp src: Axillary (08/22 0554) BP: 120/54 mmHg (08/22 0554) Pulse Rate: 101 (08/22 0554) Intake/Output from previous day: 08/21 0701 - 08/22 0700 In: 755 [NG/GT:755] Out: 1025 [Urine:1025] Intake/Output from this shift:    Labs:  Recent Labs  04/19/14 1500 04/20/14 0640  WBC  --  8.9  HGB  --  9.5*  PLT  --  101*  CREATININE 0.56 0.57   Estimated Creatinine Clearance: 105.8 ml/min (by C-G formula based on Cr of 0.57). No results found for this basename: VANCOTROUGH, Corlis Leak, VANCORANDOM, [place], GENTPEAK, GENTRANDOM, TOBRATROUGH, TOBRAPEAK, TOBRARND, AMIKACINPEAK, AMIKACINTROU, AMIKACIN,  in the last 72 hours   Microbiology: Recent Results (from the past 720 hour(s))  MRSA PCR SCREENING     Status: None   Collection Time    04/11/14  6:46 PM      Result Value Ref Range Status   MRSA by PCR NEGATIVE  NEGATIVE Final   Comment:            The GeneXpert MRSA Assay (FDA     approved for NASAL specimens     only), is one component of a     comprehensive MRSA colonization     surveillance program. It is not     intended to diagnose MRSA     infection nor to guide or     monitor treatment for     MRSA infections.    Medical History: Past Medical History  Diagnosis Date  . Hepatitis C   . Cirrhosis   . Alcohol abuse   . Portal vein thrombosis   . Thrombocytopenia   . Pancytopenia   . Physiological tremor     Medications:  Scheduled:  . amantadine  100 mg Per Tube BID  . antiseptic oral rinse  7 mL Mouth Rinse q12n4p  . chlorhexidine  15 mL Mouth Rinse BID  . feeding supplement (PIVOT 1.5 CAL)  1,000 mL Per Tube Q24H  . folic acid  1 mg Oral Daily  . free  water  200 mL Per Tube Q6H  . lactulose  30 g Oral TID  . multivitamin with minerals  1 tablet Oral Daily  . pantoprazole sodium  40 mg Per Tube Q1200  . risperiDONE  2 mg Per Tube BID  . thiamine  100 mg Oral Daily   Assessment: 58 yo male with fevers, possible PNA, for empiric antibiotics  Goal of Therapy:  Vancomycin trough level 15-20 mcg/ml  Plan:  Vancomycin 1500 mg IV now, then 1 g IV q8h Cefepime 1 g IV q8h  Verniece Encarnacion, Bronson Curb 04/21/2014,7:32 AM

## 2014-04-22 ENCOUNTER — Inpatient Hospital Stay (HOSPITAL_COMMUNITY): Payer: Medicaid Other

## 2014-04-22 LAB — BASIC METABOLIC PANEL
Anion gap: 8 (ref 5–15)
BUN: 20 mg/dL (ref 6–23)
CHLORIDE: 113 meq/L — AB (ref 96–112)
CO2: 24 mEq/L (ref 19–32)
Calcium: 8.3 mg/dL — ABNORMAL LOW (ref 8.4–10.5)
Creatinine, Ser: 0.5 mg/dL (ref 0.50–1.35)
GFR calc Af Amer: >90 mL/min (ref >90)
GFR calc non Af Amer: >90 mL/min (ref >90)
GLUCOSE: 95 mg/dL (ref 70–99)
Potassium: 4.2 mEq/L (ref 3.7–5.3)
Sodium: 145 mEq/L (ref 137–147)

## 2014-04-22 LAB — CBC WITH DIFFERENTIAL/PLATELET
BASOS ABS: 0.1 10*3/uL (ref 0.0–0.1)
Basophils Relative: 1 % (ref 0–1)
EOS PCT: 3 % (ref 0–5)
Eosinophils Absolute: 0.3 10*3/uL (ref 0.0–0.7)
HCT: 28 % — ABNORMAL LOW (ref 39.0–52.0)
Hemoglobin: 9.2 g/dL — ABNORMAL LOW (ref 13.0–17.0)
LYMPHS ABS: 2.6 10*3/uL (ref 0.7–4.0)
Lymphocytes Relative: 28 % (ref 12–46)
MCH: 33.7 pg (ref 26.0–34.0)
MCHC: 32.9 g/dL (ref 30.0–36.0)
MCV: 102.6 fL — AB (ref 78.0–100.0)
MONOS PCT: 12 % (ref 3–12)
Monocytes Absolute: 1.1 10*3/uL — ABNORMAL HIGH (ref 0.1–1.0)
Neutro Abs: 5.1 10*3/uL (ref 1.7–7.7)
Neutrophils Relative %: 56 % (ref 43–77)
Platelets: 110 10*3/uL — ABNORMAL LOW (ref 150–400)
RBC: 2.73 MIL/uL — ABNORMAL LOW (ref 4.22–5.81)
RDW: 17.6 % — ABNORMAL HIGH (ref 11.5–15.5)
WBC: 9.2 10*3/uL (ref 4.0–10.5)

## 2014-04-22 LAB — VANCOMYCIN, TROUGH: VANCOMYCIN TR: 12.3 ug/mL (ref 10.0–20.0)

## 2014-04-22 MED ORDER — VANCOMYCIN HCL 10 G IV SOLR
1250.0000 mg | Freq: Three times a day (TID) | INTRAVENOUS | Status: DC
  Administered 2014-04-22 – 2014-04-25 (×8): 1250 mg via INTRAVENOUS
  Filled 2014-04-22 (×9): qty 1250

## 2014-04-22 MED ORDER — DEXTROSE-NACL 5-0.9 % IV SOLN
INTRAVENOUS | Status: DC
  Administered 2014-04-22: 21:00:00 via INTRAVENOUS

## 2014-04-22 MED ORDER — LORAZEPAM 2 MG/ML IJ SOLN
2.0000 mg | INTRAMUSCULAR | Status: DC | PRN
  Administered 2014-04-23 (×2): 2 mg via INTRAVENOUS
  Filled 2014-04-22 (×2): qty 1

## 2014-04-22 MED ORDER — WHITE PETROLATUM GEL
Status: AC
  Administered 2014-04-22: 22:00:00
  Filled 2014-04-22: qty 5

## 2014-04-22 NOTE — Progress Notes (Signed)
Sat (8/22), Pt was very lethargic. Sun (8/24), Pt following some commands, talkative. Pleas consider swallow eval now that NGT is d/c.

## 2014-04-22 NOTE — Progress Notes (Signed)
On beginning of my shift on handoff  noticed pt chest xray from earlier in the day said that feeding tube tip was in the the cardia and to advance before feeding, and advanced 6 cm, ordered stat kub tube in fundus recommended to advance 6 more cm readvanced checked  by 2 nurses, after tapining noticed patient was working his mouth and throat until he worked the tube and pulled it out through his mouth.  Dr. Redmond Pulling notified  And ordered to leave tube out for the night

## 2014-04-22 NOTE — Progress Notes (Signed)
Subjective: Awake and alert but seems confused  Objective: Vital signs in last 24 hours: Temp:  [98.1 F (36.7 C)-100.4 F (38 C)] 100.4 F (38 C) (08/23 0818) Pulse Rate:  [95-111] 111 (08/23 0421) Resp:  [16-26] 18 (08/23 0818) BP: (114-140)/(57-77) 131/76 mmHg (08/23 0818) SpO2:  [92 %-100 %] 92 % (08/23 0421) Weight:  [195 lb 12.3 oz (88.8 kg)] 195 lb 12.3 oz (88.8 kg) (08/23 0421) Last BM Date: 04/20/14  Intake/Output from previous day: 08/22 0701 - 08/23 0700 In: -  Out: 1625 [Urine:1625] Intake/Output this shift: Total I/O In: -  Out: 325 [Urine:325]  Resp: rhonchi bilaterally Cardio: regular rate and rhythm GI: soft, non-tender; bowel sounds normal; no masses,  no organomegaly  Lab Results:   Recent Labs  04/20/14 0640 04/22/14 0602  WBC 8.9 9.2  HGB 9.5* 9.2*  HCT 28.5* 28.0*  PLT 101* 110*   BMET  Recent Labs  04/20/14 0640 04/22/14 0602  NA 147 145  K 4.3 4.2  CL 115* 113*  CO2 24 24  GLUCOSE 83 95  BUN 20 20  CREATININE 0.57 0.50  CALCIUM 8.9 8.3*   PT/INR No results found for this basename: LABPROT, INR,  in the last 72 hours ABG No results found for this basename: PHART, PCO2, PO2, HCO3,  in the last 72 hours  Studies/Results: Dg Chest Portable 1 View  04/21/2014   CLINICAL DATA:  Weakness and shortness of breath.  EXAM: PORTABLE CHEST - 1 VIEW  COMPARISON:  Portable chest x-ray of April 16, 2014  FINDINGS: The right lung is adequately inflated and clear. On the left there is increased density a especially at the lung base that suggests layering of pleural fluid posteriorly. The left hemidiaphragm is obscured. The cardiac silhouette is normal in size. There is mild tortuosity of the descending thoracic aorta. The radiodense bulb of the feeding tube lies in the region of the gastric cardia. The right subclavian venous catheter tip lies in the midportion of the SVC. There are multiple posterior left rib fractures peer  IMPRESSION:  Increased density in the left hemi thorax consistent with layering of pleural fluid posteriorly. There is no pneumothorax. The feeding tube tip lies in the gastric cardia. Advancement is needed prior to feeding.   Electronically Signed   By: David  Martinique   On: 04/21/2014 17:27   Dg Abd Portable 1v  04/21/2014   CLINICAL DATA:  58 year old male with feeding tube advancement. Initial encounter.  EXAM: PORTABLE ABDOMEN - 1 VIEW  COMPARISON:  04/20/2014 and earlier.  FINDINGS: Portable AP view at 2219 hrs. Motion artifact. Feeding tube tip in the left upper quadrant compatible with Stable placement at the gastric fundus. Based on yesterday's images, there is not adequate slack to allow for post pyloric transit. Grossly stable bowel gas pattern.  IMPRESSION: Degraded by motion artifact but suspect stable feeding tube tip positioning at the level of the gastric fundus.  Based on yesterday's study, there is not adequate slack to allow for post pyloric transit. Recommend advancing the tube 5-10 cm with followup KUB.   Electronically Signed   By: Lars Pinks M.D.   On: 04/21/2014 22:44   Dg Abd Portable 1v  04/20/2014   CLINICAL DATA:  Panda placement  EXAM: PORTABLE ABDOMEN - 1 VIEW  COMPARISON:  Abdominal film 04/16/2014  FINDINGS: NG tube extends into the stomach. The weighted tip is in the gastric fundus. Stylette remains.  IMPRESSION: Feeding tube with weighted tip in the  gastric fundus.   Electronically Signed   By: Suzy Bouchard M.D.   On: 04/20/2014 10:24    Anti-infectives: Anti-infectives   Start     Dose/Rate Route Frequency Ordered Stop   04/21/14 1400  vancomycin (VANCOCIN) IVPB 1000 mg/200 mL premix     1,000 mg 200 mL/hr over 60 Minutes Intravenous Every 8 hours 04/21/14 0736     04/21/14 0800  vancomycin (VANCOCIN) 1,500 mg in sodium chloride 0.9 % 500 mL IVPB     1,500 mg 250 mL/hr over 120 Minutes Intravenous  Once 04/21/14 0736 04/21/14 1100   04/21/14 0800  ceFEPIme (MAXIPIME) 1 g in  dextrose 5 % 50 mL IVPB     1 g 100 mL/hr over 30 Minutes Intravenous 3 times per day 04/21/14 0736     04/17/14 1000  ciprofloxacin (CIPRO) IVPB 400 mg     400 mg 200 mL/hr over 60 Minutes Intravenous Every 12 hours 04/17/14 0908 04/20/14 0011      Assessment/Plan: s/p * No surgery found * TBI per neurosurgery Physical therapy Possible early pneumonia. Vanc/Cefepime day 2  LOS: 11 days    TOTH III,Varetta Chavers S 04/22/2014

## 2014-04-22 NOTE — Progress Notes (Signed)
ANTIBIOTIC CONSULT NOTE - FOLLOW UP  Pharmacy Consult for Vancomycin and Cefepime Indication: Rule out PNA  No Known Allergies  Patient Measurements: Height: 5\' 6"  (167.6 cm) Weight: 195 lb 12.3 oz (88.8 kg) IBW/kg (Calculated) : 63.8  Vital Signs: Temp: 100.9 F (38.3 C) (08/23 1200) Temp src: Axillary (08/23 1200) BP: 113/59 mmHg (08/23 1200) Pulse Rate: 111 (08/23 0421) Intake/Output from previous day: 08/22 0701 - 08/23 0700 In: -  Out: 1625 [Urine:1625] Intake/Output from this shift: Total I/O In: -  Out: 650 [Urine:650]  Labs:  Recent Labs  04/20/14 0640 04/22/14 0602  WBC 8.9 9.2  HGB 9.5* 9.2*  PLT 101* 110*  CREATININE 0.57 0.50   Estimated Creatinine Clearance: 106.3 ml/min (by C-G formula based on Cr of 0.5).  Recent Labs  04/22/14 1330  [place] 12.3     Microbiology: Recent Results (from the past 720 hour(s))  MRSA PCR SCREENING     Status: None   Collection Time    04/11/14  6:46 PM      Result Value Ref Range Status   MRSA by PCR NEGATIVE  NEGATIVE Final   Comment:            The GeneXpert MRSA Assay (FDA     approved for NASAL specimens     only), is one component of a     comprehensive MRSA colonization     surveillance program. It is not     intended to diagnose MRSA     infection nor to guide or     monitor treatment for     MRSA infections.    Anti-infectives   Start     Dose/Rate Route Frequency Ordered Stop   04/21/14 1400  vancomycin (VANCOCIN) IVPB 1000 mg/200 mL premix     1,000 mg 200 mL/hr over 60 Minutes Intravenous Every 8 hours 04/21/14 0736     04/21/14 0800  vancomycin (VANCOCIN) 1,500 mg in sodium chloride 0.9 % 500 mL IVPB     1,500 mg 250 mL/hr over 120 Minutes Intravenous  Once 04/21/14 0736 04/21/14 1100   04/21/14 0800  ceFEPIme (MAXIPIME) 1 g in dextrose 5 % 50 mL IVPB     1 g 100 mL/hr over 30 Minutes Intravenous 3 times per day 04/21/14 0736     04/17/14 1000  ciprofloxacin (CIPRO) IVPB 400 mg     400 mg 200 mL/hr over 60 Minutes Intravenous Every 12 hours 04/17/14 0908 04/20/14 0011     Assessment: 58 yo M w/ fevers, possible PNA, started empiric abx. Febrile @ 100.9, WBC wnl at 9.2. SCr is 0.5, CrCl >128ml/min. 8/23 VT came back as 12.3.  Goal of Therapy:  Vancomycin trough level 15-20 mcg/ml  Plan:  Increase Vancomycin to 1.25g IV Q8 Cefepime 1g IV Q8 Monitor CBC, renal function, VS Recheck VT @ ss  Ravi Tuccillo J 04/22/2014,3:10 PM

## 2014-04-23 ENCOUNTER — Inpatient Hospital Stay (HOSPITAL_COMMUNITY): Payer: Medicaid Other

## 2014-04-23 DIAGNOSIS — S0230XA Fracture of orbital floor, unspecified side, initial encounter for closed fracture: Secondary | ICD-10-CM

## 2014-04-23 DIAGNOSIS — J189 Pneumonia, unspecified organism: Secondary | ICD-10-CM

## 2014-04-23 DIAGNOSIS — D696 Thrombocytopenia, unspecified: Secondary | ICD-10-CM

## 2014-04-23 MED ORDER — IPRATROPIUM-ALBUTEROL 0.5-2.5 (3) MG/3ML IN SOLN
3.0000 mL | Freq: Four times a day (QID) | RESPIRATORY_TRACT | Status: DC
  Administered 2014-04-23: 3 mL via RESPIRATORY_TRACT
  Filled 2014-04-23: qty 3

## 2014-04-23 NOTE — Progress Notes (Signed)
This patient has been seen and I agree with the findings and treatment plan.  [place] Rosamond O. Maysa Lynn, III, MD, FACS [phone] (pager) [phone] (direct pager) Trauma Surgeon  
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
## 2014-04-24 LAB — CBC WITH DIFFERENTIAL/PLATELET
BASOS ABS: 0.1 10*3/uL (ref 0.0–0.1)
BASOS PCT: 1 % (ref 0–1)
EOS ABS: 0.2 10*3/uL (ref 0.0–0.7)
Eosinophils Relative: 2 % (ref 0–5)
HEMATOCRIT: 27.2 % — AB (ref 39.0–52.0)
Hemoglobin: 9.3 g/dL — ABNORMAL LOW (ref 13.0–17.0)
Lymphocytes Relative: 28 % (ref 12–46)
Lymphs Abs: 2.1 10*3/uL (ref 0.7–4.0)
MCH: 34.3 pg — AB (ref 26.0–34.0)
MCHC: 34.2 g/dL (ref 30.0–36.0)
MCV: 100.4 fL — AB (ref 78.0–100.0)
MONO ABS: 0.8 10*3/uL (ref 0.1–1.0)
Monocytes Relative: 10 % (ref 3–12)
Neutro Abs: 4.6 10*3/uL (ref 1.7–7.7)
Neutrophils Relative %: 59 % (ref 43–77)
PLATELETS: 136 10*3/uL — AB (ref 150–400)
RBC: 2.71 MIL/uL — ABNORMAL LOW (ref 4.22–5.81)
RDW: 17.6 % — AB (ref 11.5–15.5)
WBC: 7.7 10*3/uL (ref 4.0–10.5)

## 2014-04-24 MED ORDER — RISPERIDONE 1 MG PO TABS
1.0000 mg | ORAL_TABLET | Freq: Two times a day (BID) | ORAL | Status: DC
  Administered 2014-04-24 (×2): 1 mg via ORAL
  Filled 2014-04-24 (×4): qty 1

## 2014-04-24 MED ORDER — LORAZEPAM 2 MG/ML IJ SOLN
1.0000 mg | Freq: Four times a day (QID) | INTRAMUSCULAR | Status: DC | PRN
  Administered 2014-04-24: 1 mg via INTRAVENOUS
  Administered 2014-04-25: 2 mg via INTRAVENOUS
  Filled 2014-04-24 (×2): qty 1

## 2014-04-24 MED ORDER — AMANTADINE HCL 50 MG/5ML PO SYRP
100.0000 mg | ORAL_SOLUTION | Freq: Two times a day (BID) | ORAL | Status: DC
  Administered 2014-04-24 – 2014-04-30 (×9): 100 mg via ORAL
  Filled 2014-04-24 (×13): qty 10

## 2014-04-24 MED ORDER — RISPERIDONE 2 MG PO TABS
2.0000 mg | ORAL_TABLET | Freq: Two times a day (BID) | ORAL | Status: DC
  Filled 2014-04-24 (×2): qty 1

## 2014-04-24 MED ORDER — POLYETHYLENE GLYCOL 3350 17 G PO PACK
17.0000 g | PACK | Freq: Every day | ORAL | Status: DC
  Administered 2014-04-24 – 2014-04-30 (×6): 17 g via ORAL
  Filled 2014-04-24 (×9): qty 1

## 2014-04-24 MED ORDER — SODIUM CHLORIDE 0.9 % IJ SOLN
10.0000 mL | INTRAMUSCULAR | Status: DC | PRN
  Administered 2014-04-24: 30 mL via INTRAVENOUS
  Administered 2014-04-25 – 2014-04-27 (×6): 10 mL via INTRAVENOUS
  Administered 2014-04-27 – 2014-04-29 (×3): 20 mL via INTRAVENOUS

## 2014-04-24 MED ORDER — DOCUSATE SODIUM 100 MG PO CAPS
100.0000 mg | ORAL_CAPSULE | Freq: Two times a day (BID) | ORAL | Status: DC
  Filled 2014-04-24: qty 1

## 2014-04-24 MED ORDER — BISACODYL 10 MG RE SUPP
10.0000 mg | Freq: Every day | RECTAL | Status: DC
  Administered 2014-04-24 – 2014-04-26 (×3): 10 mg via RECTAL
  Filled 2014-04-24 (×3): qty 1

## 2014-04-24 MED ORDER — IPRATROPIUM-ALBUTEROL 0.5-2.5 (3) MG/3ML IN SOLN: 3.0000 mL | Freq: Four times a day (QID) | RESPIRATORY_TRACT | Status: DC | PRN

## 2014-04-24 MED ORDER — PANTOPRAZOLE SODIUM 40 MG PO TBEC
40.0000 mg | DELAYED_RELEASE_TABLET | Freq: Every day | ORAL | Status: DC
  Administered 2014-04-24 – 2014-04-30 (×4): 40 mg via ORAL
  Filled 2014-04-24 (×4): qty 1
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
## 2014-04-24 NOTE — Progress Notes (Signed)
Patient is so much more awake and alert today.  Okay to transfer to telemetry and the floor.  This patient has been seen and I agree with the findings and treatment plan.  Kathryne Eriksson. Dahlia Bailiff, MD, [place] [phone] (pager) [phone] (direct pager) Trauma Surgeon

## 2014-04-24 NOTE — Progress Notes (Signed)
Rehab admissions - Noted patient needs to go to OR later this week ? Thursday for clavicle repair.  Will wait until all procedures are completed prior to admitting to acute inpatient rehab.  Call me for questions.  #[phone]
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
## 2014-04-24 NOTE — Progress Notes (Signed)
Pt to Keith to 6N-11, VSS, called report. Sitter at bedside

## 2014-04-24 NOTE — Progress Notes (Signed)
UR completed. Plan remains CIR after next surgery.  Sandi Mariscal, RN BSN [facility] Trauma/Neuro ICU Case Manager [phone]
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
## 2014-04-25 LAB — BASIC METABOLIC PANEL
Anion gap: 8 (ref 5–15)
BUN: 14 mg/dL (ref 6–23)
CO2: 20 meq/L (ref 19–32)
Calcium: 7.9 mg/dL — ABNORMAL LOW (ref 8.4–10.5)
Chloride: 113 meq/L — ABNORMAL HIGH (ref 96–112)
Creatinine, Ser: 0.48 mg/dL — ABNORMAL LOW (ref 0.50–1.35)
GFR calc Af Amer: >90 mL/min (ref >90)
GFR calc non Af Amer: >90 mL/min (ref >90)
GLUCOSE: 89 mg/dL (ref 70–99)
POTASSIUM: 3.8 meq/L (ref 3.7–5.3)
Sodium: 141 meq/L (ref 137–147)

## 2014-04-25 MED ORDER — RISPERIDONE 0.5 MG PO TABS
0.5000 mg | ORAL_TABLET | Freq: Two times a day (BID) | ORAL | Status: DC
  Administered 2014-04-25 (×2): 0.5 mg via ORAL
  Filled 2014-04-25 (×4): qty 1

## 2014-04-25 MED ORDER — LORAZEPAM 2 MG/ML IJ SOLN
1.0000 mg | Freq: Four times a day (QID) | INTRAMUSCULAR | Status: DC | PRN
  Administered 2014-04-25 – 2014-04-26 (×3): 1 mg via INTRAVENOUS
  Filled 2014-04-25 (×3): qty 1

## 2014-04-25 MED ORDER — DOCUSATE SODIUM 100 MG PO CAPS
200.0000 mg | ORAL_CAPSULE | Freq: Two times a day (BID) | ORAL | Status: DC
  Administered 2014-04-25 – 2014-04-29 (×6): 200 mg via ORAL
  Filled 2014-04-25 (×8): qty 2

## 2014-04-25 MED ORDER — CEFAZOLIN SODIUM-DEXTROSE 2-3 GM-% IV SOLR
2.0000 g | Freq: Three times a day (TID) | INTRAVENOUS | Status: DC
  Administered 2014-04-25 – 2014-04-27 (×7): 2 g via INTRAVENOUS
  Filled 2014-04-25 (×9): qty 50

## 2014-04-25 MED ORDER — ENSURE COMPLETE PO LIQD
237.0000 mL | Freq: Two times a day (BID) | ORAL | Status: DC
  Administered 2014-04-25 – 2014-04-30 (×4): 237 mL via ORAL

## 2014-04-25 NOTE — Progress Notes (Signed)
Orthopedic Tech Progress Note Patient Details:  Jose Krueger 27-Mar-1956 [id number] Replacement arm sling Ortho Devices Type of Ortho Device: Arm sling Ortho Device/Splint Location: LUE Ortho Device/Splint Interventions: Criss Alvine 04/25/2014, 11:05 PM

## 2014-04-25 NOTE — Progress Notes (Signed)
Very sleepy today.  This patient has been seen and I agree with the findings and treatment plan.  Kathryne Eriksson. Dahlia Bailiff, MD, [place] [phone] (pager) [phone] (direct pager) Trauma Surgeon
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
## 2014-04-25 NOTE — Progress Notes (Signed)
Patient ID: Jose Krueger, male   DOB: [birth year], 57 y.o.   MRN: [id number]   LOS: 14 days   Subjective: Very delayed, denies c/o.   Objective: Vital signs in last 24 hours: Temp:  [97.8 F (36.6 C)-100.2 F (37.9 C)] 99.7 F (37.6 C) (08/26 4034) Pulse Rate:  [90-95] 90 (08/26 0614) Resp:  [20-25] 20 (08/26 0614) BP: (113-134)/(58-81) 115/74 mmHg (08/26 0614) SpO2:  [90 %-95 %] 94 % (08/26 0614) Weight:  [189 lb 14.4 oz (86.138 kg)-190 lb 11.2 oz (86.501 kg)] 190 lb 11.2 oz (86.501 kg) (08/26 0607) Last BM Date: 04/20/14   Laboratory  BMET  Recent Labs  04/25/14 0553  NA 141  K 3.8  CL 113*  CO2 20  GLUCOSE 89  BUN 14  CREATININE 0.48*  CALCIUM 7.9*    Physical Exam General appearance: no distress Resp: rales RUL Cardio: regular rate and rhythm GI: normal findings: bowel sounds normal and soft, non-tender   Assessment/Plan: [facility]  TBI -- on amantadine. TBI team therapies. Wean psych meds - Risperidone to 0.5mg  BID, wean ativan  Left orbit fx -- Nondisplaced  Left clav/scap fxs -- Sling, per Dr. Marcelino Scot likely OR Thursday, on D3 diet  Multiple left rib fxs -- Pulmonary toilet  ABL anemia -- stable  Thrombocytopenia -- stable  ID -- competed cipro for UTI. Suspected PNA cefepime/Vanc started 8/22, will d/c and monitor closely EtOH abuse -- CIWA  Cirrhosis/hepatic mass -- MRI w/wo contrast once able to follow directions well (not ready today)  FEN -- D3 diet with thin liquids VTE -- SCD's  Dispo -- CIR when medically stable maybe Friday if done in OR. Transfer to floor 6n on tele since sitter available.    Lisette Abu, PA-C Pager: [phone] General Trauma PA Pager: [phone]  04/25/2014

## 2014-04-25 NOTE — Progress Notes (Signed)
Rehab admissions - We continue to follow pt's case and have noted that pt is planned to have clavicle surgery tomorrow per Dr. Marcelino Scot. We will consider possible inpatient rehab stay (? On Friday) once all procedures are taken care of and pt is medically cleared per trauma, pending our bed availability. I will follow pt's case.   Please call me with any questions. Thanks.  Nanetta Batty, PT Rehabilitation Admissions Coordinator [phone]
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
## 2014-04-26 LAB — CBC
HEMATOCRIT: 29.9 % — AB (ref 39.0–52.0)
HEMOGLOBIN: 9.9 g/dL — AB (ref 13.0–17.0)
MCH: 33.3 pg (ref 26.0–34.0)
MCHC: 33.1 g/dL (ref 30.0–36.0)
MCV: 100.7 fL — AB (ref 78.0–100.0)
Platelets: 173 10*3/uL (ref 150–400)
RBC: 2.97 MIL/uL — AB (ref 4.22–5.81)
RDW: 16.6 % — ABNORMAL HIGH (ref 11.5–15.5)
WBC: 7.3 10*3/uL (ref 4.0–10.5)

## 2014-04-26 MED ORDER — LORAZEPAM 2 MG/ML IJ SOLN
0.5000 mg | Freq: Four times a day (QID) | INTRAMUSCULAR | Status: DC | PRN
  Administered 2014-04-27: 0.5 mg via INTRAVENOUS
  Administered 2014-04-28: 13:00:00 via INTRAVENOUS
  Filled 2014-04-26 (×2): qty 1

## 2014-04-26 MED ORDER — RISPERIDONE 0.5 MG PO TABS
0.5000 mg | ORAL_TABLET | Freq: Every day | ORAL | Status: DC
  Administered 2014-04-26: 0.5 mg via ORAL
  Filled 2014-04-26 (×2): qty 1
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
## 2014-04-26 NOTE — Progress Notes (Signed)
For surgery today.  Possibly rehab tomorrow.  This patient has been seen and I agree with the findings and treatment plan.  Kathryne Eriksson. Dahlia Bailiff, MD, [place] [phone] (pager) [phone] (direct pager) Trauma Surgeon

## 2014-04-26 NOTE — Progress Notes (Signed)
OT Cancellation Note  Patient Details Name: Jose Krueger MRN: [id number] DOB: September 05, 1955   Cancelled Treatment:    Reason Eval/Treat Not Completed: Fatigue/lethargy limiting ability to participate;Other (comment)Pt very lethargic. Given pain meds and scheduled for surgery for clavicle today. Will attempt to see tomorrow.   Placer, OTR/L  [phone] 04/26/2014 04/26/2014, 12:16 PM

## 2014-04-26 NOTE — Anesthesia Preprocedure Evaluation (Addendum)
Anesthesia Evaluation  Patient identified by MRN, date of birth, ID band Patient awake    Reviewed: Allergy & Precautions, H&P , NPO status , Patient's Chart, lab work & pertinent test results  Airway Mallampati: II TM Distance: >3 FB Neck ROM: Full    Dental  (+) Edentulous Upper, Edentulous Lower, Dental Advisory Given   Pulmonary Current Smoker,  breath sounds clear to auscultation        Cardiovascular negative cardio ROS  Rhythm:Regular Rate:Normal     Neuro/Psych PSYCHIATRIC DISORDERS Depression negative neurological ROS     GI/Hepatic negative GI ROS, (+) Cirrhosis -    substance abuse  alcohol use, Hepatitis -, C  Endo/Other  negative endocrine ROS  Renal/GU negative Renal ROS  negative genitourinary   Musculoskeletal negative musculoskeletal ROS (+)   Abdominal   Peds  Hematology  (+) anemia ,   Anesthesia Other Findings   Reproductive/Obstetrics negative OB ROS                        Anesthesia Physical Anesthesia Plan  ASA: III  Anesthesia Plan: General   Post-op Pain Management:    Induction: Intravenous  Airway Management Planned: Oral ETT  Additional Equipment:   Intra-op Plan:   Post-operative Plan: Extubation in OR  Informed Consent: I have reviewed the patients History and Physical, chart, labs and discussed the procedure including the risks, benefits and alternatives for the proposed anesthesia with the patient or authorized representative who has indicated his/her understanding and acceptance.   Dental advisory given  Plan Discussed with: CRNA  Anesthesia Plan Comments:         Anesthesia Quick Evaluation

## 2014-04-27 ENCOUNTER — Encounter (HOSPITAL_COMMUNITY): Payer: Medicaid Other | Admitting: Anesthesiology

## 2014-04-27 ENCOUNTER — Inpatient Hospital Stay (HOSPITAL_COMMUNITY): Payer: Medicaid Other | Admitting: Anesthesiology

## 2014-04-27 ENCOUNTER — Inpatient Hospital Stay (HOSPITAL_COMMUNITY): Payer: Medicaid Other

## 2014-04-27 ENCOUNTER — Encounter (HOSPITAL_COMMUNITY): Admission: EM | Disposition: A | Discharge status: Rehabilitation Facility | Payer: Self-pay | Source: Home / Self Care

## 2014-04-27 HISTORY — PX: ORIF CLAVICULAR FRACTURE: SHX5055

## 2014-04-27 SURGERY — OPEN REDUCTION INTERNAL FIXATION (ORIF) CLAVICULAR FRACTURE
Anesthesia: General | Laterality: Left

## 2014-04-27 MED ORDER — POLYETHYLENE GLYCOL 3350 17 G PO PACK: 17.0000 g | PACK | Freq: Every day | ORAL | Status: DC | PRN

## 2014-04-27 MED ORDER — METOCLOPRAMIDE HCL 5 MG PO TABS: 5.0000 mg | ORAL_TABLET | Freq: Three times a day (TID) | ORAL | Status: DC | PRN

## 2014-04-27 MED ORDER — BISACODYL 10 MG RE SUPP: 10.0000 mg | Freq: Every day | RECTAL | Status: DC | PRN

## 2014-04-27 MED ORDER — HYDROMORPHONE HCL PF 1 MG/ML IJ SOLN: 0.5000 mg | INTRAMUSCULAR | Status: DC | PRN

## 2014-04-27 MED ORDER — PROPOFOL 10 MG/ML IV BOLUS
INTRAVENOUS | Status: AC
  Filled 2014-04-27: qty 20

## 2014-04-27 MED ORDER — LACTATED RINGERS IV SOLN
INTRAVENOUS | Status: DC | PRN
  Administered 2014-04-27 (×2): via INTRAVENOUS

## 2014-04-27 MED ORDER — MIDAZOLAM HCL 2 MG/2ML IJ SOLN
INTRAMUSCULAR | Status: AC
  Filled 2014-04-27: qty 2

## 2014-04-27 MED ORDER — DEXAMETHASONE SODIUM PHOSPHATE 4 MG/ML IJ SOLN
INTRAMUSCULAR | Status: AC
  Filled 2014-04-27: qty 1

## 2014-04-27 MED ORDER — PROPOFOL 10 MG/ML IV BOLUS
INTRAVENOUS | Status: DC | PRN
  Administered 2014-04-27 (×3): 10 mg via INTRAVENOUS
  Administered 2014-04-27: 60 mg via INTRAVENOUS
  Administered 2014-04-27: 100 mg via INTRAVENOUS
  Administered 2014-04-27: 10 mg via INTRAVENOUS

## 2014-04-27 MED ORDER — ROCURONIUM BROMIDE 100 MG/10ML IV SOLN
INTRAVENOUS | Status: DC | PRN
  Administered 2014-04-27: 50 mg via INTRAVENOUS

## 2014-04-27 MED ORDER — HYDROMORPHONE HCL PF 1 MG/ML IJ SOLN
INTRAMUSCULAR | Status: AC
  Filled 2014-04-27: qty 2

## 2014-04-27 MED ORDER — LIDOCAINE HCL (CARDIAC) 20 MG/ML IV SOLN
INTRAVENOUS | Status: AC
  Filled 2014-04-27: qty 5

## 2014-04-27 MED ORDER — OXYCODONE HCL 5 MG PO TABS: 5.0000 mg | ORAL_TABLET | Freq: Once | ORAL | Status: DC | PRN

## 2014-04-27 MED ORDER — METOCLOPRAMIDE HCL 5 MG/ML IJ SOLN: 5.0000 mg | Freq: Three times a day (TID) | INTRAMUSCULAR | Status: DC | PRN

## 2014-04-27 MED ORDER — OXYCODONE HCL 5 MG/5ML PO SOLN: 5.0000 mg | Freq: Once | ORAL | Status: DC | PRN

## 2014-04-27 MED ORDER — 0.9 % SODIUM CHLORIDE (POUR BTL) OPTIME
TOPICAL | Status: DC | PRN
  Administered 2014-04-27: 1000 mL

## 2014-04-27 MED ORDER — BUPIVACAINE HCL (PF) 0.25 % IJ SOLN
INTRAMUSCULAR | Status: DC | PRN
  Administered 2014-04-27: 20 mL

## 2014-04-27 MED ORDER — HYDROMORPHONE HCL PF 1 MG/ML IJ SOLN
0.2500 mg | INTRAMUSCULAR | Status: DC | PRN
  Administered 2014-04-27 (×3): 0.5 mg via INTRAVENOUS

## 2014-04-27 MED ORDER — ONDANSETRON HCL 4 MG/2ML IJ SOLN
INTRAMUSCULAR | Status: DC | PRN
  Administered 2014-04-27: 4 mg via INTRAVENOUS

## 2014-04-27 MED ORDER — OXYCODONE HCL 5 MG PO TABS: 5.0000 mg | ORAL_TABLET | ORAL | Status: DC | PRN

## 2014-04-27 MED ORDER — CEFAZOLIN SODIUM 1-5 GM-% IV SOLN
1.0000 g | Freq: Four times a day (QID) | INTRAVENOUS | Status: AC
  Administered 2014-04-27 – 2014-04-28 (×3): 1 g via INTRAVENOUS
  Filled 2014-04-27 (×4): qty 50

## 2014-04-27 MED ORDER — FENTANYL CITRATE 0.05 MG/ML IJ SOLN
INTRAMUSCULAR | Status: AC
  Filled 2014-04-27: qty 5

## 2014-04-27 MED ORDER — OXYCODONE-ACETAMINOPHEN 5-325 MG PO TABS
1.0000 | ORAL_TABLET | ORAL | Status: DC | PRN
  Administered 2014-04-28 – 2014-04-30 (×3): 2 via ORAL
  Filled 2014-04-27 (×3): qty 2

## 2014-04-27 MED ORDER — METHOCARBAMOL 500 MG PO TABS
500.0000 mg | ORAL_TABLET | Freq: Four times a day (QID) | ORAL | Status: DC | PRN
  Administered 2014-04-29 – 2014-04-30 (×3): 500 mg via ORAL
  Filled 2014-04-27 (×3): qty 1

## 2014-04-27 MED ORDER — ONDANSETRON HCL 4 MG/2ML IJ SOLN
INTRAMUSCULAR | Status: AC
  Filled 2014-04-27: qty 2

## 2014-04-27 MED ORDER — ROCURONIUM BROMIDE 50 MG/5ML IV SOLN
INTRAVENOUS | Status: AC
  Filled 2014-04-27: qty 1

## 2014-04-27 MED ORDER — LACTATED RINGERS IV SOLN
Freq: Once | INTRAVENOUS | Status: AC
  Administered 2014-04-27: 11:00:00 via INTRAVENOUS

## 2014-04-27 MED ORDER — BUPIVACAINE-EPINEPHRINE (PF) 0.25% -1:200000 IJ SOLN
INTRAMUSCULAR | Status: AC
  Filled 2014-04-27: qty 30

## 2014-04-27 MED ORDER — DEXAMETHASONE SODIUM PHOSPHATE 4 MG/ML IJ SOLN
INTRAMUSCULAR | Status: DC | PRN
  Administered 2014-04-27: 4 mg via INTRAVENOUS

## 2014-04-27 MED ORDER — CEFAZOLIN SODIUM-DEXTROSE 2-3 GM-% IV SOLR
INTRAVENOUS | Status: AC
  Filled 2014-04-27: qty 50

## 2014-04-27 MED ORDER — DIPHENHYDRAMINE HCL 12.5 MG/5ML PO ELIX: 12.5000 mg | ORAL_SOLUTION | ORAL | Status: DC | PRN

## 2014-04-27 MED ORDER — METHOCARBAMOL 1000 MG/10ML IJ SOLN: 500.0000 mg | Freq: Four times a day (QID) | INTRAMUSCULAR | Status: DC | PRN

## 2014-04-27 MED ORDER — BUPIVACAINE HCL (PF) 0.25 % IJ SOLN
INTRAMUSCULAR | Status: AC
  Filled 2014-04-27: qty 30

## 2014-04-27 MED ORDER — FENTANYL CITRATE 0.05 MG/ML IJ SOLN
INTRAMUSCULAR | Status: DC | PRN
  Administered 2014-04-27: 50 ug via INTRAVENOUS
  Administered 2014-04-27 (×2): 100 ug via INTRAVENOUS

## 2014-04-27 SURGICAL SUPPLY — 48 items
APL SKNCLS STERI-STRIP NONHPOA (GAUZE/BANDAGES/DRESSINGS) ×1
BENZOIN TINCTURE PRP APPL 2/3 (GAUZE/BANDAGES/DRESSINGS) ×3 IMPLANT
BIT DRILL 2.8X5 QR DISP (BIT) ×3 IMPLANT
CLEANER TIP ELECTROSURG 2X2 (MISCELLANEOUS) IMPLANT
CLOSURE STERI-STRIP 1/2X4 (GAUZE/BANDAGES/DRESSINGS) ×1
CLSR STERI-STRIP ANTIMIC 1/2X4 (GAUZE/BANDAGES/DRESSINGS) ×2 IMPLANT
COVER SURGICAL LIGHT HANDLE (MISCELLANEOUS) ×3 IMPLANT
DRAPE C-ARM 42X72 X-RAY (DRAPES) ×3 IMPLANT
DRAPE INCISE IOBAN 66X45 STRL (DRAPES) ×3 IMPLANT
DRAPE U-SHAPE 47X51 STRL (DRAPES) ×3 IMPLANT
DRSG MEPILEX BORDER 4X8 (GAUZE/BANDAGES/DRESSINGS) ×3 IMPLANT
DURAPREP 26ML APPLICATOR (WOUND CARE) ×3 IMPLANT
ELECT REM PT RETURN 9FT ADLT (ELECTROSURGICAL)
ELECTRODE REM PT RTRN 9FT ADLT (ELECTROSURGICAL) IMPLANT
GAUZE SPONGE 4X4 12PLY STRL (GAUZE/BANDAGES/DRESSINGS) ×6 IMPLANT
GAUZE XEROFORM 1X8 LF (GAUZE/BANDAGES/DRESSINGS) ×3 IMPLANT
GLOVE BIOGEL PI ORTHO PRO SZ8 (GLOVE) ×2
GLOVE ORTHO TXT STRL SZ7.5 (GLOVE) ×6 IMPLANT
GLOVE PI ORTHO PRO STRL SZ8 (GLOVE) ×1 IMPLANT
GLOVE SURG ORTHO 8.0 STRL STRW (GLOVE) ×6 IMPLANT
GOWN STRL REUS W/ TWL LRG LVL3 (GOWN DISPOSABLE) IMPLANT
GOWN STRL REUS W/TWL LRG LVL3 (GOWN DISPOSABLE)
KIT BASIN OR (CUSTOM PROCEDURE TRAY) ×3 IMPLANT
KIT ROOM TURNOVER OR (KITS) ×3 IMPLANT
MANIFOLD NEPTUNE II (INSTRUMENTS) ×3 IMPLANT
NEEDLE HYPO 25GX1X1/2 BEV (NEEDLE) ×3 IMPLANT
NS IRRIG 1000ML POUR BTL (IV SOLUTION) ×3 IMPLANT
PACK SHOULDER (CUSTOM PROCEDURE TRAY) ×3 IMPLANT
PAD ARMBOARD 7.5X6 YLW CONV (MISCELLANEOUS) ×6 IMPLANT
PLATE LOCKING 8H LEFT (Plate) ×3 IMPLANT
SCREW CORTICAL 2.3X14 (Screw) ×3 IMPLANT
SCREW HEXALOBE NON-LOCK 3.5X14 (Screw) ×15 IMPLANT
SCREW NON TOGG 2.3X16MM (Screw) ×3 IMPLANT
SCREW NONLOCK HEX 3.5X12 (Screw) ×3 IMPLANT
SPONGE LAP 4X18 X RAY DECT (DISPOSABLE) IMPLANT
STAPLER VISISTAT 35W (STAPLE) IMPLANT
SUCTION FRAZIER TIP 10 FR DISP (SUCTIONS) ×3 IMPLANT
SUT FIBERWIRE #2 38 T-5 BLUE (SUTURE)
SUT MNCRL AB 4-0 PS2 18 (SUTURE) ×3 IMPLANT
SUT VIC AB 0 CTB1 27 (SUTURE) IMPLANT
SUT VIC AB 2-0 CT1 36 (SUTURE) ×3 IMPLANT
SUT VIC AB 3-0 FS2 27 (SUTURE) ×3 IMPLANT
SUTURE FIBERWR #2 38 T-5 BLUE (SUTURE) IMPLANT
SYR BULB IRRIGATION 50ML (SYRINGE) ×3 IMPLANT
SYR CONTROL 10ML LL (SYRINGE) ×3 IMPLANT
TOWEL OR 17X24 6PK STRL BLUE (TOWEL DISPOSABLE) ×3 IMPLANT
TOWEL OR 17X26 10 PK STRL BLUE (TOWEL DISPOSABLE) ×3 IMPLANT
WATER STERILE IRR 1000ML POUR (IV SOLUTION) ×3 IMPLANT

## 2014-04-27 NOTE — Progress Notes (Signed)
UR completed.  Aspin Palomarez, RN BSN MHA CCM Trauma/Neuro ICU Case Manager [phone]  

## 2014-04-27 NOTE — Anesthesia Postprocedure Evaluation (Signed)
  Anesthesia Post-op Note  Patient: Jose Krueger  Procedure(s) Performed: Procedure(s): OPEN REDUCTION INTERNAL FIXATION (ORIF) LEFT CLAVICULAR FRACTURE (Left)  Patient Location: PACU  Anesthesia Type:General  Level of Consciousness: awake and alert   Airway and Oxygen Therapy: Patient Spontanous Breathing  Post-op Pain: moderate  Post-op Assessment: Post-op Vital signs reviewed, Patient's Cardiovascular Status Stable and Respiratory Function Stable  Post-op Vital Signs: Reviewed  Filed Vitals:   04/27/14 1406  BP: 127/78  Pulse: 89  Temp:   Resp: 14    Complications: No apparent anesthesia complications
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
## 2014-04-27 NOTE — Progress Notes (Signed)
Consent for surgery , ORIF to left clavicle and blood consent was consented by pt's brother Decorey Wahlert via phone. Consent was verified with Mayra Neer, RN
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
## 2014-04-27 NOTE — Anesthesia Procedure Notes (Signed)
Procedure Name: Intubation Date/Time: 04/27/2014 11:27 AM Performed by: Marinda Elk A Pre-anesthesia Checklist: Patient identified, Timeout performed, Emergency Drugs available, Suction available and Patient being monitored Patient Re-evaluated:Patient Re-evaluated prior to inductionOxygen Delivery Method: Circle system utilized Preoxygenation: Pre-oxygenation with 100% oxygen Intubation Type: IV induction Ventilation: Mask ventilation without difficulty and Oral airway inserted - appropriate to patient size Laryngoscope Size: Mac and 3 Grade View: Grade II Tube type: Oral Tube size: 7.5 mm Number of attempts: 1 Airway Equipment and Method: Stylet Placement Confirmation: ETT inserted through vocal cords under direct vision,  positive ETCO2 and breath sounds checked- equal and bilateral Secured at: 21 cm Tube secured with: Tape Dental Injury: Teeth and Oropharynx as per pre-operative assessment
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
## 2014-04-27 NOTE — Progress Notes (Signed)
Patient seen and examined.  Agree with PA's note.  

## 2014-04-27 NOTE — Progress Notes (Signed)
[place] for rehab from my standpoint post op whenever bed available.  Johnny Bridge, MD

## 2014-04-27 NOTE — Progress Notes (Signed)
Patient ID: Jose Krueger, male   DOB: May 04, 1956, 58 y.o.   MRN: [id number]   LOS: 16 days   Subjective: Pleasantly confused   Objective: Vital signs in last 24 hours: Temp:  [98.2 F (36.8 C)-99.1 F (37.3 C)] 98.8 F (37.1 C) (08/28 0654) Pulse Rate:  [82-93] 93 (08/28 0654) Resp:  [18-20] 20 (08/28 0654) BP: (116-144)/(53-75) 141/75 mmHg (08/28 0654) SpO2:  [95 %-99 %] 99 % (08/28 0654) Weight:  [188 lb 6.4 oz (85.458 kg)] 188 lb 6.4 oz (85.458 kg) (08/28 0411) Last BM Date: 04/26/14   Physical Exam General appearance: alert, no distress and mildly agitated Resp: clear to auscultation bilaterally Cardio: regular rate and rhythm GI: normal findings: bowel sounds normal and soft, non-tender Neuro: Ox1, confabulatory   Assessment/Plan: [facility]  TBI -- on amantadine. TBI team therapies. Wean psych meds - D/C Risperidone Left orbit fx -- Nondisplaced  Left clav/scap fxs -- Sling, per Dr. Mardelle Matte OR today Multiple left rib fxs -- Pulmonary toilet  ABL anemia -- stable  Thrombocytopenia -- Resolved  EtOH abuse -- CIWA  Cirrhosis/hepatic mass -- MRI w/wo contrast once able to follow directions well (not ready today)  FEN -- D3 diet with thin liquids  VTE -- SCD's  Dispo -- CIR when able    Lisette Abu, PA-C Pager: [phone] General Trauma PA Pager: [phone]  04/27/2014

## 2014-04-27 NOTE — Progress Notes (Signed)
SLP Cancellation Note  Patient Details Name: Jose Krueger MRN: [id number] DOB: 02-Jun-1956   Cancelled treatment:       Reason Eval/Treat Not Completed: Patient at procedure or test/unavailable    Germain Osgood, M.A. CCC-SLP [phone]  Germain Osgood 04/27/2014, 11:26 AM

## 2014-04-27 NOTE — Transfer of Care (Signed)
Immediate Anesthesia Transfer of Care Note  Patient: Jose Krueger  Procedure(s) Performed: Procedure(s): OPEN REDUCTION INTERNAL FIXATION (ORIF) LEFT CLAVICULAR FRACTURE (Left)  Patient Location: PACU  Anesthesia Type:General  Level of Consciousness: awake  Airway & Oxygen Therapy: Patient Spontanous Breathing and Patient connected to nasal cannula oxygen  Post-op Assessment: Report given to PACU RN and Post -op Vital signs reviewed and stable  Post vital signs: Reviewed and stable  Complications: No apparent anesthesia complications

## 2014-04-28 ENCOUNTER — Inpatient Hospital Stay (HOSPITAL_COMMUNITY): Payer: Medicaid Other

## 2014-04-28 ENCOUNTER — Inpatient Hospital Stay (HOSPITAL_COMMUNITY): Payer: Self-pay | Admitting: Speech Pathology

## 2014-04-28 MED ORDER — LORAZEPAM 2 MG/ML IJ SOLN
INTRAMUSCULAR | Status: AC
  Filled 2014-04-28: qty 1

## 2014-04-28 MED ORDER — LORAZEPAM 2 MG/ML IJ SOLN
2.0000 mg | INTRAMUSCULAR | Status: DC | PRN
  Administered 2014-04-28: 2 mg via INTRAVENOUS

## 2014-04-28 MED ORDER — LORAZEPAM 2 MG/ML IJ SOLN
1.0000 mg | INTRAMUSCULAR | Status: DC | PRN
  Administered 2014-04-29: 1 mg via INTRAVENOUS
  Administered 2014-04-29 – 2014-04-30 (×7): 2 mg via INTRAVENOUS
  Filled 2014-04-28 (×8): qty 1

## 2014-04-28 NOTE — Progress Notes (Signed)
Trauma surgery:  Patient interviewed and examined. Agree with PA note. Appreciate Dr. Luanna Cole repair of left clavicle. He seems stable. CIR on Monday.  Jose Krueger. Dalbert Batman, M.D., [facility] Office:   [phone] Pager:   [phone]

## 2014-04-28 NOTE — Progress Notes (Signed)
Patient ID: Jose Krueger, male   DOB: 1956-02-16, 58 y.o.   MRN: [id number]   LOS: 17 days   Subjective: NSC, denies significant pain, still confabulatory.   Objective: Vital signs in last 24 hours: Temp:  [97.9 F (36.6 C)-99.4 F (37.4 C)] 98.5 F (36.9 C) (08/29 0534) Pulse Rate:  [40-97] 87 (08/29 0512) Resp:  [14-26] 18 (08/29 0512) BP: (102-148)/(57-108) 148/82 mmHg (08/29 0512) SpO2:  [93 %-100 %] 94 % (08/29 0512) Weight:  [185 lb 6.4 oz (84.097 kg)] 185 lb 6.4 oz (84.097 kg) (08/29 0512) Last BM Date: 04/26/14   Physical Exam General appearance: alert and no distress Resp: diminished breath sounds LUL Cardio: regular rate and rhythm GI: normal findings: bowel sounds normal and soft, non-tender   Assessment/Plan: [facility]  TBI -- on amantadine. TBI team therapies.  Left orbit fx -- Nondisplaced  Left clav/scap fxs s/p ORIF clavicle -- Sling, per Dr. Mardelle Matte Multiple left rib fxs -- Pulmonary toilet  ABL anemia -- stable  EtOH abuse -- CIWA  Cirrhosis/hepatic mass -- MRI w/wo contrast once able to follow directions well (not ready today)  FEN -- Check CXR, d/c PICC VTE -- SCD's  Dispo -- CIR Monday    Lisette Abu, PA-C Pager: [phone] General Trauma PA Pager: [phone]  04/28/2014

## 2014-04-28 NOTE — Progress Notes (Signed)
Subjective: 1 Day Post-Op Procedure(s) (LRB): OPEN REDUCTION INTERNAL FIXATION (ORIF) LEFT CLAVICULAR FRACTURE (Left) Patient reports pain as mild.  Pleasantly confused, speaking with "Kayla" on imaginary phone.  Objective: Vital signs in last 24 hours: Temp:  [97.9 F (36.6 C)-99.4 F (37.4 C)] 98.5 F (36.9 C) (08/29 0534) Pulse Rate:  [40-97] 87 (08/29 0512) Resp:  [14-26] 18 (08/29 0512) BP: (102-148)/(57-108) 148/82 mmHg (08/29 0512) SpO2:  [93 %-100 %] 94 % (08/29 0512) Weight:  [84.097 kg (185 lb 6.4 oz)] 84.097 kg (185 lb 6.4 oz) (08/29 0512)  Intake/Output from previous day: 08/28 0701 - 08/29 0700 In: 1000 [I.V.:1000] Out: 880 [Urine:850; Blood:30] Intake/Output this shift: Total I/O In: 600 [P.O.:600] Out: -    Recent Labs  04/26/14 0557  HGB 9.9*    Recent Labs  04/26/14 0557  WBC 7.3  RBC 2.97*  HCT 29.9*  PLT 173   No results found for this basename: NA, K, CL, CO2, BUN, CREATININE, GLUCOSE, CALCIUM,  in the last 72 hours No results found for this basename: LABPT, INR,  in the last 72 hours  Neurovascular intact Sensation intact distally Intact pulses distally Incision: dressing C/D/I and scant drainage  Assessment/Plan: 1 Day Post-Op Procedure(s) (LRB): OPEN REDUCTION INTERNAL FIXATION (ORIF) LEFT CLAVICULAR FRACTURE (Left) Plan per trauma service NWB LUE in sling  Jose Krueger 04/28/2014, 9:02 AM

## 2014-04-29 NOTE — Progress Notes (Signed)
I have seen and examined the pt and agree with PA-Jeffery's progress note.

## 2014-04-29 NOTE — Progress Notes (Signed)
Patient ID: Jose Krueger, male   DOB: 10/14/1955, 58 y.o.   MRN: [id number]   LOS: 18 days   Subjective: Agitated this morning.   Objective: Vital signs in last 24 hours: Temp:  [98.2 F (36.8 C)-98.4 F (36.9 C)] 98.2 F (36.8 C) (08/30 0450) Pulse Rate:  [82-92] 88 (08/30 0450) Resp:  [17-18] 18 (08/30 0450) BP: (129-142)/(64-86) 142/86 mmHg (08/30 0450) SpO2:  [95 %-98 %] 96 % (08/30 0450) Weight:  [189 lb 14.4 oz (86.138 kg)] 189 lb 14.4 oz (86.138 kg) (08/30 0800) Last BM Date: 04/26/14   Physical Exam General appearance: mild distress Resp: clear to auscultation bilaterally Cardio: regular rate and rhythm GI: normal findings: bowel sounds normal and soft, non-tender   Assessment/Plan: [facility]  TBI -- on amantadine, should increase to 150mg  bid 9/1. TBI team therapies.  Left orbit fx -- Nondisplaced  Left clav/scap fxs s/p ORIF clavicle -- Sling, per Dr. Mardelle Matte  Multiple left rib fxs -- Pulmonary toilet. Will hold on thoracentesis as effusion does not appear to be clinically significant. ABL anemia -- stable  EtOH abuse -- CIWA  Cirrhosis/hepatic mass -- MRI w/wo contrast once able to follow directions well (not ready today)  FEN -- No issues VTE -- SCD's  Dispo -- CIR Monday    Lisette Abu, PA-C Pager: [phone] General Trauma PA Pager: [phone]  04/29/2014

## 2014-04-30 ENCOUNTER — Inpatient Hospital Stay (HOSPITAL_COMMUNITY)
Admission: RE | Admit: 2014-04-30 | Discharge: 2014-05-23 | DRG: 945 | Disposition: A | Discharge status: Rehabilitation Facility | Payer: Medicaid Other | Source: Intra-hospital | Attending: Physical Medicine & Rehabilitation | Admitting: Physical Medicine & Rehabilitation

## 2014-04-30 DIAGNOSIS — S2242XA Multiple fractures of ribs, left side, initial encounter for closed fracture: Secondary | ICD-10-CM | POA: Diagnosis present

## 2014-04-30 DIAGNOSIS — F411 Generalized anxiety disorder: Secondary | ICD-10-CM | POA: Diagnosis present

## 2014-04-30 DIAGNOSIS — D62 Acute posthemorrhagic anemia: Secondary | ICD-10-CM | POA: Diagnosis present

## 2014-04-30 DIAGNOSIS — D61818 Other pancytopenia: Secondary | ICD-10-CM | POA: Diagnosis present

## 2014-04-30 DIAGNOSIS — B965 Pseudomonas (aeruginosa) (mallei) (pseudomallei) as the cause of diseases classified elsewhere: Secondary | ICD-10-CM | POA: Diagnosis not present

## 2014-04-30 DIAGNOSIS — F29 Unspecified psychosis not due to a substance or known physiological condition: Secondary | ICD-10-CM | POA: Diagnosis not present

## 2014-04-30 DIAGNOSIS — Z86718 Personal history of other venous thrombosis and embolism: Secondary | ICD-10-CM

## 2014-04-30 DIAGNOSIS — N390 Urinary tract infection, site not specified: Secondary | ICD-10-CM | POA: Diagnosis not present

## 2014-04-30 DIAGNOSIS — K7460 Unspecified cirrhosis of liver: Secondary | ICD-10-CM | POA: Diagnosis present

## 2014-04-30 DIAGNOSIS — F1010 Alcohol abuse, uncomplicated: Secondary | ICD-10-CM

## 2014-04-30 DIAGNOSIS — S069X0A Unspecified intracranial injury without loss of consciousness, initial encounter: Secondary | ICD-10-CM | POA: Diagnosis present

## 2014-04-30 DIAGNOSIS — S2249XA Multiple fractures of ribs, unspecified side, initial encounter for closed fracture: Secondary | ICD-10-CM

## 2014-04-30 DIAGNOSIS — E538 Deficiency of other specified B group vitamins: Secondary | ICD-10-CM | POA: Diagnosis present

## 2014-04-30 DIAGNOSIS — S42009A Fracture of unspecified part of unspecified clavicle, initial encounter for closed fracture: Secondary | ICD-10-CM

## 2014-04-30 DIAGNOSIS — Z5189 Encounter for other specified aftercare: Principal | ICD-10-CM

## 2014-04-30 DIAGNOSIS — R4587 Impulsiveness: Secondary | ICD-10-CM | POA: Diagnosis not present

## 2014-04-30 DIAGNOSIS — F3289 Other specified depressive episodes: Secondary | ICD-10-CM | POA: Diagnosis present

## 2014-04-30 DIAGNOSIS — F329 Major depressive disorder, single episode, unspecified: Secondary | ICD-10-CM | POA: Diagnosis present

## 2014-04-30 DIAGNOSIS — S42002A Fracture of unspecified part of left clavicle, initial encounter for closed fracture: Secondary | ICD-10-CM

## 2014-04-30 DIAGNOSIS — S069X9A Unspecified intracranial injury with loss of consciousness of unspecified duration, initial encounter: Secondary | ICD-10-CM

## 2014-04-30 DIAGNOSIS — F17200 Nicotine dependence, unspecified, uncomplicated: Secondary | ICD-10-CM | POA: Diagnosis present

## 2014-04-30 DIAGNOSIS — S069XAA Unspecified intracranial injury with loss of consciousness status unknown, initial encounter: Secondary | ICD-10-CM | POA: Diagnosis present

## 2014-04-30 DIAGNOSIS — B1920 Unspecified viral hepatitis C without hepatic coma: Secondary | ICD-10-CM | POA: Diagnosis present

## 2014-04-30 MED ORDER — VITAMIN B-1 100 MG PO TABS
100.0000 mg | ORAL_TABLET | Freq: Every day | ORAL | Status: DC
  Administered 2014-05-01 – 2014-05-23 (×21): 100 mg via ORAL
  Filled 2014-04-30 (×25): qty 1

## 2014-04-30 MED ORDER — BISACODYL 10 MG RE SUPP
10.0000 mg | Freq: Every day | RECTAL | Status: DC | PRN
  Administered 2014-05-01 – 2014-05-16 (×4): 10 mg via RECTAL
  Filled 2014-04-30 (×4): qty 1

## 2014-04-30 MED ORDER — IPRATROPIUM-ALBUTEROL 0.5-2.5 (3) MG/3ML IN SOLN: 3.0000 mL | Freq: Four times a day (QID) | RESPIRATORY_TRACT | Status: DC | PRN

## 2014-04-30 MED ORDER — TRAZODONE HCL 50 MG PO TABS
25.0000 mg | ORAL_TABLET | Freq: Every evening | ORAL | Status: DC | PRN
  Administered 2014-04-30 – 2014-05-04 (×5): 50 mg via ORAL
  Filled 2014-04-30 (×6): qty 1

## 2014-04-30 MED ORDER — PROCHLORPERAZINE MALEATE 5 MG PO TABS
5.0000 mg | ORAL_TABLET | Freq: Four times a day (QID) | ORAL | Status: DC | PRN
  Filled 2014-04-30: qty 2

## 2014-04-30 MED ORDER — CHLORHEXIDINE GLUCONATE 0.12 % MT SOLN
15.0000 mL | Freq: Two times a day (BID) | OROMUCOSAL | Status: DC
  Administered 2014-04-30 – 2014-05-22 (×35): 15 mL via OROMUCOSAL
  Filled 2014-04-30 (×48): qty 15

## 2014-04-30 MED ORDER — ACETAMINOPHEN 325 MG PO TABS
325.0000 mg | ORAL_TABLET | ORAL | Status: DC | PRN
  Administered 2014-05-07: 650 mg via ORAL
  Filled 2014-04-30: qty 2

## 2014-04-30 MED ORDER — GUAIFENESIN-DM 100-10 MG/5ML PO SYRP: 5.0000 mL | ORAL_SOLUTION | Freq: Four times a day (QID) | ORAL | Status: DC | PRN

## 2014-04-30 MED ORDER — SODIUM CHLORIDE 0.9 % IJ SOLN
10.0000 mL | INTRAMUSCULAR | Status: DC | PRN
  Administered 2014-05-01 (×2): 30 mL
  Administered 2014-05-02: 10 mL
  Administered 2014-05-02: 30 mL
  Administered 2014-05-02 (×2): 10 mL

## 2014-04-30 MED ORDER — FLEET ENEMA 7-19 GM/118ML RE ENEM: 1.0000 | ENEMA | Freq: Once | RECTAL | Status: AC | PRN

## 2014-04-30 MED ORDER — PROCHLORPERAZINE 25 MG RE SUPP
12.5000 mg | Freq: Four times a day (QID) | RECTAL | Status: DC | PRN
  Filled 2014-04-30: qty 1

## 2014-04-30 MED ORDER — OXYCODONE-ACETAMINOPHEN 5-325 MG PO TABS
1.0000 | ORAL_TABLET | ORAL | Status: DC | PRN
  Administered 2014-04-30 – 2014-05-01 (×3): 2 via ORAL
  Administered 2014-05-02: 1 via ORAL
  Filled 2014-04-30 (×3): qty 2
  Filled 2014-04-30: qty 1

## 2014-04-30 MED ORDER — AMANTADINE HCL 50 MG/5ML PO SYRP
100.0000 mg | ORAL_SOLUTION | Freq: Two times a day (BID) | ORAL | Status: DC
  Administered 2014-04-30 – 2014-05-10 (×19): 100 mg via ORAL
  Filled 2014-04-30 (×26): qty 10

## 2014-04-30 MED ORDER — ALUM & MAG HYDROXIDE-SIMETH 200-200-20 MG/5ML PO SUSP
30.0000 mL | ORAL | Status: DC | PRN
  Administered 2014-05-06 – 2014-05-23 (×2): 30 mL via ORAL
  Filled 2014-04-30 (×2): qty 30

## 2014-04-30 MED ORDER — ENSURE COMPLETE PO LIQD
237.0000 mL | Freq: Two times a day (BID) | ORAL | Status: DC
  Administered 2014-05-01 – 2014-05-23 (×28): 237 mL via ORAL

## 2014-04-30 MED ORDER — FOLIC ACID 1 MG PO TABS
1.0000 mg | ORAL_TABLET | Freq: Every day | ORAL | Status: DC
  Administered 2014-05-01 – 2014-05-23 (×21): 1 mg via ORAL
  Filled 2014-04-30 (×25): qty 1

## 2014-04-30 MED ORDER — ADULT MULTIVITAMIN W/MINERALS CH
1.0000 | ORAL_TABLET | Freq: Every day | ORAL | Status: DC
  Administered 2014-05-01 – 2014-05-23 (×21): 1 via ORAL
  Filled 2014-04-30 (×25): qty 1

## 2014-04-30 MED ORDER — TRAMADOL HCL 50 MG PO TABS
50.0000 mg | ORAL_TABLET | Freq: Four times a day (QID) | ORAL | Status: DC | PRN
  Administered 2014-05-03 – 2014-05-08 (×5): 50 mg via ORAL
  Filled 2014-04-30 (×5): qty 1

## 2014-04-30 MED ORDER — DIPHENHYDRAMINE HCL 12.5 MG/5ML PO ELIX: 12.5000 mg | ORAL_SOLUTION | Freq: Four times a day (QID) | ORAL | Status: DC | PRN

## 2014-04-30 MED ORDER — PROCHLORPERAZINE EDISYLATE 5 MG/ML IJ SOLN
5.0000 mg | Freq: Four times a day (QID) | INTRAMUSCULAR | Status: DC | PRN
  Administered 2014-05-10 – 2014-05-16 (×2): 10 mg via INTRAMUSCULAR
  Filled 2014-04-30: qty 2

## 2014-04-30 MED ORDER — PANTOPRAZOLE SODIUM 40 MG PO TBEC
40.0000 mg | DELAYED_RELEASE_TABLET | Freq: Every day | ORAL | Status: DC
  Administered 2014-05-01 – 2014-05-02 (×2): 40 mg via ORAL
  Filled 2014-04-30 (×2): qty 1

## 2014-04-30 MED ORDER — CETYLPYRIDINIUM CHLORIDE 0.05 % MT LIQD
7.0000 mL | Freq: Two times a day (BID) | OROMUCOSAL | Status: DC
  Administered 2014-04-30 – 2014-05-22 (×21): 7 mL via OROMUCOSAL

## 2014-04-30 MED ORDER — POLYETHYLENE GLYCOL 3350 17 G PO PACK
17.0000 g | PACK | Freq: Every day | ORAL | Status: DC
  Administered 2014-05-01 – 2014-05-22 (×20): 17 g via ORAL
  Filled 2014-04-30 (×25): qty 1

## 2014-04-30 MED ORDER — LACTULOSE 10 GM/15ML PO SOLN
30.0000 g | Freq: Three times a day (TID) | ORAL | Status: DC
  Administered 2014-04-30 – 2014-05-01 (×2): 30 g via ORAL
  Filled 2014-04-30 (×5): qty 45

## 2014-04-30 NOTE — Progress Notes (Signed)
Rehab admissions - Noted patient ready for acute inpatient rehab today.  Bed available and will admit to inpatient rehab today.  Call me for questions.  #[phone]

## 2014-04-30 NOTE — Progress Notes (Signed)
Pt is admitted to room 4W17 from 6 N. Admission vital sign is stable

## 2014-04-30 NOTE — Progress Notes (Signed)
Inpatient called to inquire about the transfer, Comm RN stated they still waiting for insurance approval , willl check later.

## 2014-04-30 NOTE — Progress Notes (Signed)
SLP Cancellation Note  Patient Details Name: Jose Krueger MRN: [id number] DOB: 18-Jun-1956   Cancelled treatment:       Reason Eval/Treat Not Completed: Other (comment): RN present upon SLP arrival, and reports that she had administered patient ativan. She shares that he has been doing well with his current diet textures. Will continue to follow as able (note plan to transfer to CIR today). Would recommend avoiding sedating medications as able in order to facilitate progression and recovery.     Jose Krueger, M.A. CCC-SLP [phone]  Jose Krueger 04/30/2014, 11:46 AM
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
## 2014-04-30 NOTE — Progress Notes (Signed)
Patient to be transferred to 417, Report given to Comm, RN receiving nurse.
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
## 2014-04-30 NOTE — Progress Notes (Signed)
Patient ID: Jose Krueger, male   DOB: [birth year], 57 y.o.   MRN: [id number]   LOS: 19 days   Subjective: About the same as yesterday though speech a bit clearer.   Objective: Vital signs in last 24 hours: Temp:  [97.3 F (36.3 C)-98.3 F (36.8 C)] 97.3 F (36.3 C) (08/31 0539) Pulse Rate:  [82-84] 84 (08/31 0539) Resp:  [16] 16 (08/31 0539) BP: (135-143)/(78-84) 143/80 mmHg (08/31 0539) SpO2:  [94 %-95 %] 94 % (08/31 0539) Weight:  [183 lb 14.4 oz (83.416 kg)-189 lb 14.4 oz (86.138 kg)] 183 lb 14.4 oz (83.416 kg) (08/31 0500) Last BM Date: 04/26/14   Physical Exam General appearance: no distress Resp: clear to auscultation bilaterally Cardio: regular rate and rhythm GI: normal findings: bowel sounds normal and soft, non-tender   Assessment/Plan: [facility]  TBI -- on amantadine, should increase to 150mg  bid 9/1. TBI team therapies.  Left orbit fx -- Nondisplaced  Left clav/scap fxs s/p ORIF clavicle -- Sling, per Dr. Mardelle Matte  Multiple left rib fxs -- Pulmonary toilet. Will hold on thoracentesis as effusion does not appear to be clinically significant.  ABL anemia -- stable  EtOH abuse -- CIWA  Cirrhosis/hepatic mass -- MRI w/wo contrast once able to follow directions well (not ready today)  FEN -- No issues  VTE -- SCD's  Dispo -- CIR today    Lisette Abu, PA-C Pager: [phone] General Trauma PA Pager: [phone]  04/30/2014

## 2014-04-30 NOTE — Progress Notes (Signed)
Hopefully to CIR today Patient examined and I agree with the assessment and plan  Georganna Skeans, MD, MPH, FACS Trauma: [phone] General Surgery: [phone]  04/30/2014 9:11 AM

## 2014-05-01 ENCOUNTER — Inpatient Hospital Stay (HOSPITAL_COMMUNITY): Payer: Self-pay

## 2014-05-01 ENCOUNTER — Inpatient Hospital Stay (HOSPITAL_COMMUNITY): Payer: Medicaid Other | Admitting: *Deleted

## 2014-05-01 ENCOUNTER — Inpatient Hospital Stay (HOSPITAL_COMMUNITY): Payer: Self-pay | Admitting: *Deleted

## 2014-05-01 ENCOUNTER — Encounter (HOSPITAL_COMMUNITY): Payer: Self-pay | Admitting: Orthopedic Surgery

## 2014-05-01 ENCOUNTER — Inpatient Hospital Stay (HOSPITAL_COMMUNITY): Payer: Self-pay | Admitting: Speech Pathology

## 2014-05-01 DIAGNOSIS — S2249XA Multiple fractures of ribs, unspecified side, initial encounter for closed fracture: Secondary | ICD-10-CM

## 2014-05-01 DIAGNOSIS — S069XAA Unspecified intracranial injury with loss of consciousness status unknown, initial encounter: Secondary | ICD-10-CM

## 2014-05-01 DIAGNOSIS — S42009A Fracture of unspecified part of unspecified clavicle, initial encounter for closed fracture: Secondary | ICD-10-CM

## 2014-05-01 DIAGNOSIS — S069X9A Unspecified intracranial injury with loss of consciousness of unspecified duration, initial encounter: Secondary | ICD-10-CM

## 2014-05-01 DIAGNOSIS — F1010 Alcohol abuse, uncomplicated: Secondary | ICD-10-CM

## 2014-05-01 LAB — TSH: TSH: 2.62 u[IU]/mL (ref 0.350–4.500)

## 2014-05-01 LAB — CBC WITH DIFFERENTIAL/PLATELET
BASOS ABS: 0 10*3/uL (ref 0.0–0.1)
BASOS PCT: 1 % (ref 0–1)
Eosinophils Absolute: 0.2 10*3/uL (ref 0.0–0.7)
Eosinophils Relative: 4 % (ref 0–5)
HEMATOCRIT: 30.8 % — AB (ref 39.0–52.0)
Hemoglobin: 10.4 g/dL — ABNORMAL LOW (ref 13.0–17.0)
Lymphocytes Relative: 42 % (ref 12–46)
Lymphs Abs: 2.3 10*3/uL (ref 0.7–4.0)
MCH: 32.6 pg (ref 26.0–34.0)
MCHC: 33.8 g/dL (ref 30.0–36.0)
MCV: 96.6 fL (ref 78.0–100.0)
MONO ABS: 0.9 10*3/uL (ref 0.1–1.0)
Monocytes Relative: 16 % — ABNORMAL HIGH (ref 3–12)
NEUTROS ABS: 2 10*3/uL (ref 1.7–7.7)
Neutrophils Relative %: 37 % — ABNORMAL LOW (ref 43–77)
Platelets: 204 10*3/uL (ref 150–400)
RBC: 3.19 MIL/uL — ABNORMAL LOW (ref 4.22–5.81)
RDW: 16.6 % — ABNORMAL HIGH (ref 11.5–15.5)
WBC: 5.5 10*3/uL (ref 4.0–10.5)

## 2014-05-01 LAB — COMPREHENSIVE METABOLIC PANEL
ALBUMIN: 2 g/dL — AB (ref 3.5–5.2)
ALT: 34 U/L (ref 0–53)
AST: 68 U/L — AB (ref 0–37)
Alkaline Phosphatase: 178 U/L — ABNORMAL HIGH (ref 39–117)
Anion gap: 8 (ref 5–15)
BILIRUBIN TOTAL: 1.7 mg/dL — AB (ref 0.3–1.2)
BUN: 8 mg/dL (ref 6–23)
CALCIUM: 8.5 mg/dL (ref 8.4–10.5)
CHLORIDE: 103 meq/L (ref 96–112)
CO2: 24 mEq/L (ref 19–32)
CREATININE: 0.43 mg/dL — AB (ref 0.50–1.35)
GFR calc Af Amer: >90 mL/min (ref >90)
GFR calc non Af Amer: >90 mL/min (ref >90)
Glucose, Bld: 96 mg/dL (ref 70–99)
Potassium: 4.3 mEq/L (ref 3.7–5.3)
Sodium: 135 mEq/L — ABNORMAL LOW (ref 137–147)
TOTAL PROTEIN: 6.7 g/dL (ref 6.0–8.3)

## 2014-05-01 LAB — AMMONIA: Ammonia: 71 umol/L — ABNORMAL HIGH (ref 11–60)

## 2014-05-01 LAB — VITAMIN B12: VITAMIN B 12: 1415 pg/mL — AB (ref 211–911)

## 2014-05-01 MED ORDER — LACTULOSE 10 GM/15ML PO SOLN
45.0000 g | Freq: Three times a day (TID) | ORAL | Status: DC
  Administered 2014-05-01 – 2014-05-07 (×15): 45 g via ORAL
  Filled 2014-05-01 (×24): qty 90

## 2014-05-01 NOTE — Progress Notes (Signed)
Ammonia higher despite addition of lactulose. Will increase to 45 ml tid.
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
## 2014-05-01 NOTE — Plan of Care (Signed)
Problem: RH BLADDER ELIMINATION Goal: RH STG MANAGE BLADDER WITH ASSISTANCE STG Manage Bladder With Mod Assistance  Outcome: Not Progressing Incont. Wears condom catheter.

## 2014-05-01 NOTE — Progress Notes (Signed)
Occupational Therapy Session Note  Patient Details  Name: Jose Krueger MRN: [id number] Date of Birth: [birth year]  Today's Date: 05/01/2014 OT Individual Time: 1300-1330 OT Individual Time Calculation (min): 30 min    Short Term Goals: Week 1:  OT Short Term Goal 1 (Week 1): Patient will demonstrate focused attention to self-care task for 10 seconds OT Short Term Goal 2 (Week 1): Patient will wash 5/10 body parts with mod cues   OT Short Term Goal 3 (Week 1): Patient will follow 1 step commands 50% of time during functional activity  OT Short Term Goal 4 (Week 1): Patient will complete functional transfers with max assist   Skilled Therapeutic Interventions/Progress Updates:  Pt seen for 1:1 OT session with focus on command following, initiation, arousal, and functional transfers. Pt received supine in bed, opening eyes upon name. Pt with increased arousal during session, keeping eyes open 75% of time and making eye contact for brief period once. Pt required min assist for initiation for sit>supine then sat [place] style on bed. Pt perseverating "a dog" and unable to determine if pt was visually hallucinating as he scanned environment on verbally perseverating. Pt transitioned to sitting EOB and engaged in doffing brief as pt incontinent. Pt beginning to escalate as he removed socks then brief after it was donned. Allowed for rest breaks then pt becoming impulsive as he was attempting to stand and stating "move" when therapist trying to assist. Pt transferred to supine in bed with +2 assist and immediately closing eyes. Pt left supine in bed with all needs in reach. Informed PT of allowing pt to have rest break to deescalate before beginning her session.   Therapy Documentation Precautions:  Precautions Precautions: Fall Precaution Comments: left scapula and left clavicle fractures Required Braces or Orthoses: Sling (L UE) Restrictions Weight Bearing Restrictions: Yes LUE Weight Bearing:  Non weight bearing Other Position/Activity Restrictions: per ortho notes: AROM allowed for LT UE General:   Vital Signs: Therapy Vitals Temp: 97.6 F (36.4 C) Temp src: Axillary Pulse Rate: 83 Resp: 18 BP: 143/84 mmHg Patient Position (if appropriate): Lying Oxygen Therapy SpO2: 97 % O2 Device: None (Room air) Pain: Pt indicates pain via facial grimacing however unable to verbalize location.   See FIM for current functional status  Therapy/Group: Individual Therapy  Duayne Cal 05/01/2014, 2:34 PM
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
## 2014-05-01 NOTE — Plan of Care (Signed)
Problem: RH SAFETY Goal: RH STG ADHERE TO SAFETY PRECAUTIONS W/ASSISTANCE/DEVICE STG Adhere to Safety Precautions With mod Assistance/Device.  Outcome: Not Progressing Bed alrm, mittens and side rails up x4
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
## 2014-05-01 NOTE — Progress Notes (Signed)
Patient information reviewed and entered into eRehab system by Kamarah Bilotta, RN, CRRN, PPS Coordinator.  Information including medical coding and functional independence measure will be reviewed and updated through discharge.    

## 2014-05-01 NOTE — Progress Notes (Signed)
Recreational Therapy Session Note  Patient Details  Name: Jose Krueger MRN: [id number] Date of Birth: 1956-07-27 Today's Date: 05/01/2014  Pt placed on HOLD for TR services at this time.  Will continue to monitor through team for future participation. [place] 05/01/2014, 4:08 PM
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
## 2014-05-01 NOTE — Progress Notes (Signed)
Note and chart reviewed. Agree with assessment.   Pryor Ochoa RD, LDN Inpatient Clinical Dietitian Pager: [phone] After Hours Pager: [phone]
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
## 2014-05-01 NOTE — Progress Notes (Signed)
Physical Therapy Note  Patient Details  Name: Jose Krueger MRN: [id number] Date of Birth: [birth year] Today's Date: 05/01/2014    Patient missed 60 minutes of skilled physical therapy this PM secondary to lethargy/decreased arousal and attention. Patient without eyes open or verbalization despite max-total multimodal cues. Will follow up as able.   Wymore Greenlee Ancheta, PT, DPT 05/01/2014, 2:37 PM

## 2014-05-02 ENCOUNTER — Inpatient Hospital Stay (HOSPITAL_COMMUNITY): Payer: Self-pay

## 2014-05-02 ENCOUNTER — Encounter (HOSPITAL_COMMUNITY): Payer: Self-pay | Admitting: Occupational Therapy

## 2014-05-02 ENCOUNTER — Inpatient Hospital Stay (HOSPITAL_COMMUNITY): Payer: Self-pay | Admitting: Speech Pathology

## 2014-05-02 ENCOUNTER — Inpatient Hospital Stay (HOSPITAL_COMMUNITY): Payer: Self-pay | Admitting: Physical Therapy

## 2014-05-02 DIAGNOSIS — S2249XA Multiple fractures of ribs, unspecified side, initial encounter for closed fracture: Secondary | ICD-10-CM

## 2014-05-02 DIAGNOSIS — F1010 Alcohol abuse, uncomplicated: Secondary | ICD-10-CM

## 2014-05-02 DIAGNOSIS — S069X9A Unspecified intracranial injury with loss of consciousness of unspecified duration, initial encounter: Secondary | ICD-10-CM

## 2014-05-02 DIAGNOSIS — S42009A Fracture of unspecified part of unspecified clavicle, initial encounter for closed fracture: Secondary | ICD-10-CM

## 2014-05-02 DIAGNOSIS — S069XAA Unspecified intracranial injury with loss of consciousness status unknown, initial encounter: Secondary | ICD-10-CM

## 2014-05-02 MED ORDER — PANTOPRAZOLE SODIUM 40 MG PO PACK
40.0000 mg | PACK | Freq: Every day | ORAL | Status: DC
  Administered 2014-05-03 – 2014-05-21 (×18): 40 mg via ORAL
  Filled 2014-05-02 (×23): qty 20

## 2014-05-02 MED ORDER — FLEET ENEMA 7-19 GM/118ML RE ENEM
1.0000 | ENEMA | Freq: Once | RECTAL | Status: AC
  Administered 2014-05-02: 1 via RECTAL
  Filled 2014-05-02: qty 1

## 2014-05-02 MED ORDER — METHYLPHENIDATE HCL 5 MG PO TABS
5.0000 mg | ORAL_TABLET | Freq: Two times a day (BID) | ORAL | Status: DC
  Administered 2014-05-02 – 2014-05-09 (×13): 5 mg via ORAL
  Filled 2014-05-02 (×13): qty 1

## 2014-05-02 MED ORDER — OXYCODONE HCL 5 MG PO TABS
5.0000 mg | ORAL_TABLET | ORAL | Status: DC | PRN
  Administered 2014-05-02: 10 mg via ORAL
  Administered 2014-05-02: 5 mg via ORAL
  Administered 2014-05-03 – 2014-05-06 (×7): 10 mg via ORAL
  Administered 2014-05-08 (×2): 5 mg via ORAL
  Administered 2014-05-09 – 2014-05-11 (×4): 10 mg via ORAL
  Administered 2014-05-12: 5 mg via ORAL
  Administered 2014-05-14 – 2014-05-18 (×4): 10 mg via ORAL
  Filled 2014-05-02: qty 1
  Filled 2014-05-02: qty 2
  Filled 2014-05-02: qty 1
  Filled 2014-05-02 (×2): qty 2
  Filled 2014-05-02: qty 1
  Filled 2014-05-02 (×2): qty 2
  Filled 2014-05-02: qty 1
  Filled 2014-05-02 (×12): qty 2

## 2014-05-02 NOTE — Progress Notes (Signed)
[place] PHYSICAL MEDICINE & REHABILITATION     PROGRESS NOTE    Subjective/Complaints: No new issues. Limited attention  Objective: Vital Signs: Blood pressure 164/95, pulse 76, temperature 98 F (36.7 C), temperature source Oral, resp. rate 20, weight 83.008 kg (183 lb), SpO2 97.00%. No results found.  Recent Labs  05/01/14 0520  WBC 5.5  HGB 10.4*  HCT 30.8*  PLT 204    Recent Labs  05/01/14 0520  NA 135*  K 4.3  CL 103  GLUCOSE 96  BUN 8  CREATININE 0.43*  CALCIUM 8.5   CBG (last 3)  No results found for this basename: GLUCAP,  in the last 72 hours  Wt Readings from Last 3 Encounters:  05/02/14 83.008 kg (183 lb)  04/30/14 83.416 kg (183 lb 14.4 oz)  04/30/14 83.416 kg (183 lb 14.4 oz)    Physical Exam:  Nursing note and vitals reviewed.  Constitutional: He appears well-developed and well-nourished. Cervical collar and nasal cannula in place.  Needed sternal rubs to awaken briefly. Listing to the right in bed and resisting attempts at repositioning. BUE in mittens. Moaning occasionally.  HENT:  Head: Normocephalic.  Neck:  Supple. Central line in place right neck. Left clavicle incision with steri strips in place (no sling)  Cardiovascular: Regular rhythm.  Respiratory: Tachypnea noted. He has rhonchi.  Wet upper airway sounds. ronchi with occasional wheeze  GI: Soft. Bowel sounds are normal. He exhibits no distension.  Musculoskeletal: He exhibits no edema.  LUE with long linear abraded area from shoulder to mid forearm with some scabbing. Bilateral patella with large abraded areas with loss of epidermal tissue with pink underlying tissue. Left with large   scab. Two additional abrasion on left lateral leg.  Neurological:  Remains   Confused. Limited eye contact. Does not recognize name with choice of two. Won't participate in examination. Appears to move all 4's     Assessment/Plan: 1. Functional deficits secondary to TBI, polytrauma which  require 3+ hours per day of interdisciplinary therapy in a comprehensive inpatient rehab setting. Physiatrist is providing close team supervision and 24 hour management of active medical problems listed below. Physiatrist and rehab team continue to assess barriers to discharge/monitor patient progress toward functional and medical goals. FIM: FIM - Bathing Bathing Steps Patient Completed: Chest;Front perineal area Bathing: 1: Total-Patient completes 0-2 of 10 parts or less than 25%  FIM - Upper Body Dressing/Undressing Upper body dressing/undressing: 0: Wears gown/pajamas-no public clothing FIM - Lower Body Dressing/Undressing Lower body dressing/undressing: 1: Two helpers (sit<>stand +2)        FIM - Control and instrumentation engineer Devices: HOB elevated;Bed rails;Arm rests Bed/Chair Transfer: 3: Supine > Sit: Mod A (lifting assist/Pt. 50-74%/lift 2 legs;1: Two helpers  FIM - Locomotion: Wheelchair Locomotion: Wheelchair: 1: Total Assistance/staff pushes wheelchair (Pt<25%) (in recliner) FIM - Locomotion: Ambulation Locomotion: Ambulation Assistive Devices: Other (comment) (B HHA) Ambulation/Gait Assistance: 1: +2 Total assist  Comprehension Comprehension Mode: Auditory Comprehension: 2-Understands basic 25 - 49% of the time/requires cueing 51 - 75% of the time  Expression Expression Mode: Verbal Expression: 2-Expresses basic 25 - 49% of the time/requires cueing 50 - 75% of the time. Uses single words/gestures.  Social Interaction Social Interaction: 2-Interacts appropriately 25 - 49% of time - Needs frequent redirection.  Problem Solving Problem Solving: 1-Solves basic less than 25% of the time - needs direction nearly all the time or does not effectively solve problems and may need a restraint for safety  Memory  Memory: 1-Recognizes or recalls less than 25% of the time/requires cueing greater than 75% of the time  Medical Problem List and Plan:  1.  Functional deficits secondary to TBI  2. DVT Prophylaxis/Anticoagulation: Mechanical: Antiembolism stockings, thigh (TED hose) Bilateral lower extremities  Sequential compression devices, below knee Bilateral lower extremities  3. Pain Management: Monitor for outward signs of distress and medicate as appropriate.  4. Mood: Not able to determine at this time. H/o anxiety/depression/withdrawal with recommendation for outpatient treatment 02/2014. Will monitor as mentation improves. LCSW to follow along for evaluation/support when appropriate.  5. Neuropsych: This patient is not capable of making decisions on his own behalf.   -requires SR x 4 for safety  -add low dose ritalin for arousal 6. Skin/Wound Care: Monitor multiple abrasions on extremities for healing. Pressure relief measures. Nutritional supplement to promote wound healing. Question po intake with fluctuating mental status.  7. Advanced Cirrhosis of liver/Liver mass?: Continue lactulose tid. ammonia levels pending  -. MRI 12/2010 with two small suspicious liver lesions with recommendations for follow up at that time. (Had seen Dr. Benson Norway 2012 [place])  8. Left clavicle fracture: NWB LUE. Sling for support as able.  9. ABLA: Will recheck labs in am. Question Vit B 12 deficiency with elevate MCV. Check levels in am.  10. Activation: amantadine, add ritalin  -  11. Vitamin B 12 deficiency?; lab pending today  -remaining labs ok, keep an eye on sodium level  LOS (Days) 2 A FACE TO FACE EVALUATION WAS PERFORMED  SWARTZ,ZACHARY T 05/02/2014 8:30 AM
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
## 2014-05-02 NOTE — Plan of Care (Signed)
Problem: RH BLADDER ELIMINATION Goal: RH STG MANAGE BLADDER WITH ASSISTANCE STG Manage Bladder With Mod Assistance  Outcome: Not Progressing Voids incont. Occasional cont. Wears condom cath. @ night.toilet for continence day.
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
## 2014-05-02 NOTE — Evaluation (Signed)
The assessment and plan has been reviewed and SLP is in agreement. Chudney Scheffler, M.A., CCC-SLP [phone]  
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
## 2014-05-02 NOTE — Plan of Care (Signed)
Problem: RH SAFETY Goal: RH STG ADHERE TO SAFETY PRECAUTIONS W/ASSISTANCE/DEVICE STG Adhere to Safety Precautions With mod Assistance/Device.  Outcome: Not Progressing Poor safety awareness, pulls at steris to L shoulder and r  IV-picc.mittens and side rails up x4.

## 2014-05-03 ENCOUNTER — Encounter (HOSPITAL_COMMUNITY): Payer: Self-pay

## 2014-05-03 ENCOUNTER — Inpatient Hospital Stay (HOSPITAL_COMMUNITY): Payer: Medicaid Other | Admitting: *Deleted

## 2014-05-03 ENCOUNTER — Inpatient Hospital Stay (HOSPITAL_COMMUNITY): Payer: Self-pay

## 2014-05-03 ENCOUNTER — Inpatient Hospital Stay (HOSPITAL_COMMUNITY): Payer: Self-pay | Admitting: Occupational Therapy

## 2014-05-03 ENCOUNTER — Inpatient Hospital Stay (HOSPITAL_COMMUNITY): Payer: Self-pay | Admitting: *Deleted

## 2014-05-03 NOTE — Progress Notes (Signed)
Occupational Therapy Note  Patient Details  Name: Jose Krueger MRN: [id number] Date of Birth: 09-Apr-1956  Today's Date: 05/03/2014 OT Individual Time: 1300-1330 OT Individual Time Calculation (min): 30 min   Attempted to engage patient in dynamic standing activity in therapy gym.  Pt not oriented to place or situation.  Pt was adamant he was at [facility] or close to it.  Pt responded to all questions/commands with "why." Pt continually looked over shoulders to see what was going on behind him.  Although pt would not respond to questions from therapist he would ask other people what they were saying.  Focus on orientation, following one step commands, and active participation in therapy.   Leotis Shames [facility] 05/03/2014, 2:40 PM

## 2014-05-03 NOTE — Progress Notes (Signed)
[facility] PHYSICAL MEDICINE & REHABILITATION     PROGRESS NOTE    Subjective/Complaints: No new issues overnight. restless  Objective: Vital Signs: Blood pressure 141/78, pulse 84, temperature 98.3 F (36.8 C), temperature source Oral, resp. rate 18, weight 89.2 kg (196 lb 10.4 oz), SpO2 98.00%. No results found.  Recent Labs  05/01/14 0520  WBC 5.5  HGB 10.4*  HCT 30.8*  PLT 204    Recent Labs  05/01/14 0520  NA 135*  K 4.3  CL 103  GLUCOSE 96  BUN 8  CREATININE 0.43*  CALCIUM 8.5   CBG (last 3)  No results found for this basename: GLUCAP,  in the last 72 hours  Wt Readings from Last 3 Encounters:  05/03/14 89.2 kg (196 lb 10.4 oz)  04/30/14 83.416 kg (183 lb 14.4 oz)  04/30/14 83.416 kg (183 lb 14.4 oz)    Physical Exam:  Nursing note and vitals reviewed.  Constitutional: He appears well-developed and well-nourished. Cervical collar and nasal cannula in place.  Needed sternal rubs to awaken briefly. Listing to the right in bed and resisting attempts at repositioning. BUE in mittens. Moaning occasionally.  HENT:  Head: Normocephalic.  Neck:  Supple. Central line in place right neck. Left clavicle incision with steri strips in place (no sling)  Cardiovascular: Regular rhythm.  Respiratory: Tachypnea noted. He has rhonchi.  Wet upper airway sounds. ronchi with occasional wheeze  GI: Soft. Bowel sounds are normal. He exhibits no distension.  Musculoskeletal: He exhibits no edema.  LUE with long linear abraded area from shoulder to mid forearm with some scabbing. Bilateral patella with large abraded areas with loss of epidermal tissue with pink underlying tissue. Left with large   scab. Two additional abrasion on left lateral leg.  Neurological:  Remains   Confused. Limited eye contact. Does not recognize name with choice of two. Won't participate in examination. Appears to move all 4's     Assessment/Plan: 1. Functional deficits secondary to TBI,  polytrauma which require 3+ hours per day of interdisciplinary therapy in a comprehensive inpatient rehab setting. Physiatrist is providing close team supervision and 24 hour management of active medical problems listed below. Physiatrist and rehab team continue to assess barriers to discharge/monitor patient progress toward functional and medical goals. FIM: FIM - Bathing Bathing Steps Patient Completed: Chest;Front perineal area Bathing: 1: Two helpers  FIM - Upper Body Dressing/Undressing Upper body dressing/undressing: 1: Two helpers FIM - Lower Body Dressing/Undressing Lower body dressing/undressing: 1: Two helpers  FIM - Toileting Toileting: 1: Two helpers  FIM - Radio producer Devices: Elevated toilet seat;Grab bars Toilet Transfers: 1-Two helpers  FIM - Control and instrumentation engineer Devices: HOB elevated;Bed rails;Arm rests Bed/Chair Transfer: 3: Supine > Sit: Mod A (lifting assist/Pt. 50-74%/lift 2 legs  FIM - Locomotion: Wheelchair Locomotion: Wheelchair: 1: Total Assistance/staff pushes wheelchair (Pt<25%) FIM - Locomotion: Ambulation Locomotion: Ambulation Assistive Devices: Other (comment) Ambulation/Gait Assistance: 1: +2 Total assist Locomotion: Ambulation: 1: Two helpers  Comprehension Comprehension Mode: Auditory Comprehension: 1-Understands basic less than 25% of the time/requires cueing 75% of the time  Expression Expression Mode: Verbal Expression: 1-Expresses basis less than 25% of the time/requires cueing greater than 75% of the time.  Social Interaction Social Interaction: 1-Interacts appropriately less than 25% of the time. May be withdrawn or combative.  Problem Solving Problem Solving: 1-Solves basic less than 25% of the time - needs direction nearly all the time or does not effectively solve problems and may  need a restraint for safety  Memory Memory: 1-Recognizes or recalls less than 25% of the  time/requires cueing greater than 75% of the time  Medical Problem List and Plan:  1. Functional deficits secondary to TBI  2. DVT Prophylaxis/Anticoagulation: Mechanical: Antiembolism stockings, thigh (TED hose) Bilateral lower extremities  Sequential compression devices, below knee Bilateral lower extremities  3. Pain Management: Monitor for outward signs of distress and medicate as appropriate.  4. Mood: Not able to determine at this time. H/o anxiety/depression/withdrawal with recommendation for outpatient treatment 02/2014. Will monitor as mentation improves. LCSW to follow along for evaluation/support when appropriate.  5. Neuropsych: This patient is not capable of making decisions on his own behalf.   -requires SR x 4 for safety  -add low dose ritalin for arousal 6. Skin/Wound Care: Monitor multiple abrasions on extremities for healing. Pressure relief measures. Nutritional supplement to promote wound healing. Question po intake with fluctuating mental status.  7. Advanced Cirrhosis of liver/Liver mass?: Continue lactulose tid. ammonia levels pending  -. MRI 12/2010 with two small suspicious liver lesions with recommendations for follow up at that time. (Had seen Dr. Benson Norway 2012 [place])  8. Left clavicle fracture: NWB LUE. Sling for support as able.  9. ABLA: Will recheck labs in am. Question Vit B 12 deficiency with elevate MCV. Check levels in am.  10. Activation: amantadine, add ritalin  -  11. Vitamin B 12 deficiency   - labs ok, keep an eye on sodium level  LOS (Days) 3 A FACE TO FACE EVALUATION WAS PERFORMED  Jametta Moorehead T 05/03/2014 9:19 AM

## 2014-05-03 NOTE — Discharge Summary (Signed)
Deshana Rominger, MD, MPH, FACS Trauma: [phone] General Surgery: [phone]  

## 2014-05-03 NOTE — Progress Notes (Addendum)
Speech Language Pathology Daily Session Note  Patient Details  Name: DYLLON HENKEN MRN: [id number] Date of Birth: 1956-02-26  Today's Date: 05/03/2014 SLP Individual Time: 0830-0930 SLP Individual Time Calculation (min): 60 min  Short Term Goals: Week 1: SLP Short Term Goal 1 (Week 1): Pt will utilize external aids to orient to place, date, and situation with mod-max assist  SLP Short Term Goal 2 (Week 1): Pt will sustain attention to basic tasks for 2-3 minutes with mod-max assist for redirection.  SLP Short Term Goal 3 (Week 1): Pt will tolerate his currently prescribed diet with no overt s/s of aspiration and mod assist for use of swallowing precautions.  SLP Short Term Goal 4 (Week 1): Pt will use external aids to improve immediate recall of basic, daily information for 75% accuracy with max assist.  SLP Short Term Goal 5 (Week 1): Pt will return demonstration of call bell to indicate needs/wants with nursing for 25% of opportunities with max assist.    Skilled Therapeutic Interventions: Skilled treatment session focused on addressing dysphagia and cognitive-linguistic goals.  SLP facilitated session with 2+ assist for safety during transfer from bed to chair to facilitate increased arousal.  Patient with increased arousal today following transfer, but internal distractions pain versus need to go to the bathroom appeared to be the most limiting factor to participation. SLP attempted to meet these needs; however, patient was unable to sustain attention to urination.  Patient required Max multimodal cues for brief sustained attention to and problem solving related to PO intake and only consumed minimal amounts of Dys 3 textures and thin liquids via straw before he began spitting food out.  Despite behavioral component which impacts overall PO intake, patient appeared to tolerate current prescribed diet with no overt s/s of aspiration.  Recommend to continue with current plan of care.   FIM:   Comprehension Comprehension Mode: Auditory Comprehension: 2-Understands basic 25 - 49% of the time/requires cueing 51 - 75% of the time Expression Expression Mode: Verbal Expression: 2-Expresses basic 25 - 49% of the time/requires cueing 50 - 75% of the time. Uses single words/gestures. Social Interaction Social Interaction: 1-Interacts appropriately less than 25% of the time. May be withdrawn or combative. Problem Solving Problem Solving: 1-Solves basic less than 25% of the time - needs direction nearly all the time or does not effectively solve problems and may need a restraint for safety Memory Memory: 1-Recognizes or recalls less than 25% of the time/requires cueing greater than 75% of the time  Pain Pain Assessment Pain Assessment: Yes Faces Pain Scale: Hurts worst Pain Type: Acute pain Pain Location: Rib cage Pain Orientation: Left Pain Intervention(s): RN notified and Medication administered   Therapy/Group: Individual Therapy  Carmelia Roller., CCC-SLP [phone]  [place] 05/03/2014, 9:44 AM

## 2014-05-03 NOTE — Progress Notes (Signed)
Occupational Therapy Session Note  Patient Details  Name: Jose Krueger MRN: [id number] Date of Birth: Oct 14, 1955  Today's Date: 05/03/2014 OT Individual Time: 1120-1205 and 1400-1435 OT Individual Time Calculation (min): 45 min and 35 min     Short Term Goals: Week 1:  OT Short Term Goal 1 (Week 1): Patient will demonstrate focused attention to self-care task for 10 seconds OT Short Term Goal 2 (Week 1): Patient will wash 5/10 body parts with mod cues   OT Short Term Goal 3 (Week 1): Patient will follow 1 step commands 50% of time during functional activity  OT Short Term Goal 4 (Week 1): Patient will complete functional transfers with max assist   Skilled Therapeutic Interventions/Progress Updates:    Session 1: Pt seen for ADL retraining with focus on attention to left, initiation, focused attention, command following, functional mobility, functional transfers, and family education. Pt received supine in bed with girlfriend present. Educated on [place], focus of therapy, ELOS, therapy goals, and BI education. Would benefit from further education. Engaged in bathing sitting EOB with max multimodal cues for initiation, sequencing, and attention to task. Completed LB dressing with total assist to initiate and max cues for completion. Pt requesting to toilet. Ambulated with mod HHA bed>bathroom with max cues for attention and safety. Pt demonstrating inattention to L requiring total assist to avoid running into bar in bathroom. Pt with continent episode, however required total assist for clothing management. Returned to w/c and left sitting in room with QRB donned. Discussed with girlfriend about safety plan and notifying staff before leaving pt alone in room.   Session 2: Pt seen for 1:1 OT session with focus on orientation, focused attention, following commands, and functional transfers. Pt received sitting in w/c. Engaged in orientation questions, however, pt not answering any stating "Well you  tell me." Attempted to leave room however pt resisting placing BLE on foot rest and demonstrating restlessness when therapist behind pt pushing w/c. Encouraged pt to propel self while giving instructions, however pt with no active participation. Pt reporting need to use bathroom. Ambulated with min-mod assist +2 into bathroom with max cues. Upon entering bathroom, pt unable to follow one-step commands to get to toilet and demonstrated poor frustration tolerance. Pt easily redirected to return to bed. Pt left supine in bed with 4 bed rails up, lights off, blinds closed, and bed alarm on to allow for de-escalation. Pt raising voice at therapist upon leaving room stating "You stand at that door" as he pointed to the door. Notified RN of increased agitation. Pt missing 25 min skilled OT to allow for de-escalation.    Therapy Documentation Precautions:  Precautions Precautions: Fall Precaution Comments: left scapula and left clavicle fractures Required Braces or Orthoses: Sling Restrictions Weight Bearing Restrictions: Yes LUE Weight Bearing: Non weight bearing Other Position/Activity Restrictions: per ortho notes: AROM allowed for LT UE General: General OT Amount of Missed Time: 15 Minutes Vital Signs:   Pain: Pain Assessment Pain Assessment: No/denies pain Faces Pain Scale: Hurts even more Pain Type: Acute pain Pain Location: Rib cage Pain Orientation: Left Pain Intervention(s): Medication (See eMAR)  See FIM for current functional status  Therapy/Group: Individual Therapy  Duayne Cal 05/03/2014, 12:10 PM

## 2014-05-03 NOTE — Progress Notes (Signed)
Physical Therapy Session Note  Patient Details  Name: Jose Krueger MRN: [id number] Date of Birth: [birth year]  Today's Date: 05/03/2014 PT Individual Time: 1500-1539 PT Individual Time Calculation (min): 39 min   Short Term Goals: Week 1:  PT Short Term Goal 1 (Week 1): Patient will follow one step commands with 75% accuracy. PT Short Term Goal 2 (Week 1): Patient will demonstrate sustained attention x10" in controlled environment with max cues. PT Short Term Goal 3 (Week 1): Patient will perform functional transfers with modA x1. PT Short Term Goal 4 (Week 1): Patient will perform bed mobility from flat bed with supervision. PT Short Term Goal 5 (Week 1): Patient will ambulate 70' with LRAD and modA x1.  Skilled Therapeutic Interventions/Progress Updates:    Patient received supine in bed. Session focused on initiation, sustained attention, and command following with functional mobility tasks. Patient requires max cues/encouragement to get OOB, but able to transfer supine>sit with minA, minA stand pivot transfer to wheelchair. Functional ambulation 174' x1 in controlled environment with R HHA and minA, frequent standing breaks in which patient requires mod cues to redirect to continue ambulation. Patient with very short step/stride length, but able to increase B with verbal cues and therefore improved gait speed. Patient negotiated 2 steps with R handrail and minA, mod cues for sequencing and attention to task.   After 2 steps, patient reports need to use bathroom; transported back to room and able to stand with minA to urinate for continent episode. Patient seated in wheelchair to wash hands, requires max cues to initiate and for task completion. Patient with increasing irritability and therefore, assisted back to bed, minA for transfer and modA for sit>supine. Patient left supine in bed with 4 rails up and bed alarm on; all needs within reach. RN notified of patient status.  Throughout  session, patient requires max cues to maintain NWB precautions for L UE, but still attempts to weight bear. Additionally, patient with language of confusion, but increased intelligibility from 2 days ago.  Therapy Documentation Precautions:  Precautions Precautions: Fall Precaution Comments: left scapula and left clavicle fractures Restrictions Weight Bearing Restrictions: Yes LUE Weight Bearing: Non weight bearing Other Position/Activity Restrictions: per ortho notes: AROM allowed for LT UE General: PT Amount of Missed Time (min): 21 Minutes PT Missed Treatment Reason: Patient fatigue;Increased agitation Pain: Pain Assessment Pain Assessment: Faces (reports pain by pointing to L ribs) Faces Pain Scale: Hurts even more Locomotion : Ambulation Ambulation/Gait Assistance: 4: Min assist   See FIM for current functional status  Therapy/Group: Individual Therapy  Lillia Abed. Johari Pinney, PT, DPT 05/03/2014, 3:57 PM

## 2014-05-04 ENCOUNTER — Ambulatory Visit (HOSPITAL_COMMUNITY): Payer: Self-pay | Admitting: Occupational Therapy

## 2014-05-04 ENCOUNTER — Ambulatory Visit (HOSPITAL_COMMUNITY): Payer: Self-pay

## 2014-05-04 ENCOUNTER — Inpatient Hospital Stay (HOSPITAL_COMMUNITY): Payer: Self-pay

## 2014-05-04 ENCOUNTER — Encounter (HOSPITAL_COMMUNITY): Payer: Self-pay

## 2014-05-04 DIAGNOSIS — M7989 Other specified soft tissue disorders: Secondary | ICD-10-CM

## 2014-05-04 MED ORDER — CARBAMAZEPINE 200 MG PO TABS
200.0000 mg | ORAL_TABLET | Freq: Two times a day (BID) | ORAL | Status: DC
  Administered 2014-05-04 – 2014-05-05 (×3): 200 mg via ORAL
  Filled 2014-05-04 (×5): qty 1

## 2014-05-04 NOTE — Progress Notes (Signed)
Occupational Therapy Session Note  Patient Details  Name: Jose Krueger MRN: [id number] Date of Birth: November 10, 1955  Today's Date: 05/04/2014 OT Individual Time:  -  Missed 60 minutes of OT.    Patient unwilling to participate;Patient fatigue   Short Term Goals: Week 1:  OT Short Term Goal 1 (Week 1): Patient will demonstrate focused attention to self-care task for 10 seconds OT Short Term Goal 2 (Week 1): Patient will wash 5/10 body parts with mod cues   OT Short Term Goal 3 (Week 1): Patient will follow 1 step commands 50% of time during functional activity  OT Short Term Goal 4 (Week 1): Patient will complete functional transfers with max assist   Skilled Therapeutic Interventions/Progress Updates:  1:1  Pt supine in vail bed.  Pt. Alerted to name.  Pt spoke in gruff voice and demanded to know what we wanted.  Explained purpose of treatment for him to get stronger.  Provided max encouragement and multi attempts to sit EOB.  Pt. required Increased time for processing.  He continued to refused treatment.  Left pt in posey bed will needs in reach.  Discussed with RN.   Pt. Missed 60 minutes of scheduled skilled Occupational therapy due to unwillingness to participate and fatigue.        Therapy Documentation Precautions:  Precautions Precautions: Fall Precaution Comments: left scapula and left clavicle fractures Required Braces or Orthoses: Sling Restrictions Weight Bearing Restrictions: Yes LUE Weight Bearing: Non weight bearing Other Position/Activity Restrictions: per ortho notes: AROM allowed for LT UE General: General OT Amount of Missed Time: 60 Minutes Pain: Pain Assessment Pain Assessment: No/denies pain       Therapy/Group: Individual Therapy  Lisa Roca 05/04/2014, 4:31 PM
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
## 2014-05-04 NOTE — Plan of Care (Signed)
Problem: RH BLADDER ELIMINATION Goal: RH STG MANAGE BLADDER WITH ASSISTANCE STG Manage Bladder With Mod Assistance  Outcome: Not Progressing Pt having incont. Episodes with urgency evident

## 2014-05-04 NOTE — Progress Notes (Signed)
VASCULAR LAB PRELIMINARY  PRELIMINARY  PRELIMINARY  PRELIMINARY  Bilateral lower extremity venous duplex completed.    Preliminary report:  Bilateral:  No evidence of DVT, superficial thrombosis, or Baker's Cyst.   Zyra Parrillo, RVS 05/04/2014, 9:15 AM
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
## 2014-05-04 NOTE — Progress Notes (Signed)
Occupational Therapy Session Note  Patient Details  Name: Jose Krueger MRN: [id number] Date of Birth: March 08, 1956  Today's Date: 05/04/2014 OT Individual Time: 4098-1191 OT Individual Time Calculation (min): 63 min    Short Term Goals: Week 1:  OT Short Term Goal 1 (Week 1): Patient will demonstrate focused attention to self-care task for 10 seconds OT Short Term Goal 2 (Week 1): Patient will wash 5/10 body parts with mod cues   OT Short Term Goal 3 (Week 1): Patient will follow 1 step commands 50% of time during functional activity  OT Short Term Goal 4 (Week 1): Patient will complete functional transfers with max assist   Skilled Therapeutic Interventions/Progress Updates:    Session 1: Pt seen for ADL retraining with focus on safety awareness, focused attention, initiation, task completion, command following, and functional mobility. Pt received sitting upright in vail bed. Required total assist to initiate sitting EOB with max cues. Ambulated to bathroom with min-mod +2 assist for safety with max cues for attention. Completed bathing at shower level with total assist and pt following 1 step commands <25% of time. Engaged in dressing from [place] chair with more then reasonable amount of time (30 min+ for LB dressing). Pt required max multimodal cues for task completion, following 1 step commands and focused attention. Pt demonstrating language of confusion throughout session. Pt requires max cues to adhere to NWB precautions, however still attempts to weight bear. Pt left in recliner chair with QRB donned and placed at nurses station.   Therapy Documentation Precautions:  Precautions Precautions: Fall Precaution Comments: left scapula and left clavicle fractures Required Braces or Orthoses: Sling Restrictions Weight Bearing Restrictions: Yes LUE Weight Bearing: Non weight bearing Other Position/Activity Restrictions: per ortho notes: AROM allowed for LT UE General: General PT  Missed Treatment Reason: Patient unwilling to participate;Patient fatigue Vital Signs:   Pain: Pain Assessment Faces Pain Scale: Hurts even more Pain Location: Back Pain Intervention(s): Medication (See eMAR)   Therapy/Group: Individual Therapy  Duayne Cal 05/04/2014, 10:55 AM

## 2014-05-04 NOTE — Care Management Note (Signed)
[facility] Individual Statement of Services  Patient Name:  Jose Krueger  Date:  05/03/2014  Welcome to the [facility].  Our goal is to provide you with an individualized program based on your diagnosis and situation, designed to meet your specific needs.  With this comprehensive rehabilitation program, you will be expected to participate in at least 3 hours of rehabilitation therapies Monday-Friday, with modified therapy programming on the weekends.  Your rehabilitation program will include the following services:  Physical Therapy (PT), Occupational Therapy (OT), Speech Therapy (ST), 24 hour per day rehabilitation nursing, Therapeutic Recreaction (TR), Neuropsychology, Case Management (Social Worker), Rehabilitation Medicine, Nutrition Services and Pharmacy Services  Weekly team conferences will be held on Tuesdays to discuss your progress.  Your Social Worker will talk with you frequently to get your input and to update you on team discussions.  Team conferences with you and your family in attendance may also be held.  Expected length of stay: 3-4 weeks  Overall anticipated outcome: 24/7 supervision  Depending on your progress and recovery, your program may change. Your Social Worker will coordinate services and will keep you informed of any changes. Your Social Worker's name and contact numbers are listed  below.  The following services may also be recommended but are not provided by the [facility] will be made to provide these services after discharge if needed.  Arrangements include referral to agencies that provide these services.  Your insurance has been verified to be:  None (Medicaid and SSD applications pending) Your primary doctor is:  None  Pertinent information will be shared with your doctor and  your insurance company.  Social Worker:  [place], [place] or ([phone]   Information discussed with and copy given to patient by: Lennart Pall, 05/03/2014, 1:00 PM

## 2014-05-04 NOTE — Progress Notes (Signed)
Physical Therapy Session Note  Patient Details  Name: Jose Krueger MRN: [id number] Date of Birth: 31-Jul-1956  Today's Date: 05/04/2014 PT Missed Time: 48 Minutes Missed Time Reason: Patient unwilling to participate;Patient fatigue  Short Term Goals: Week 1:  PT Short Term Goal 1 (Week 1): Patient will follow one step commands with 75% accuracy. PT Short Term Goal 2 (Week 1): Patient will demonstrate sustained attention x10" in controlled environment with max cues. PT Short Term Goal 3 (Week 1): Patient will perform functional transfers with modA x1. PT Short Term Goal 4 (Week 1): Patient will perform bed mobility from flat bed with supervision. PT Short Term Goal 5 (Week 1): Patient will ambulate 84' with LRAD and modA x1.  Skilled Therapeutic Interventions/Progress Updates:  1:1. Pt received supine in bed with EC. Therapist attempting to increase alertness/arousal and engage pt with max multimodal cues x10 min, but pt continued to keep EC (behavioral?). Pt resisting therapist moving R UE and mumbling, but unintelligible. Therapist checking back in 30minutes later to reattempt initiation of tx session w/ max multimodal cues. Pt opening eyes intermittently and verbally responding 2x, stating "I don't give a shit" following total A for orientation and "I'm not hungry" after attempted encouragement for participation with eating breakfast or toileting. Pt left supine in posey bed w/ all needs in reach. RN aware. Pt missed 82min of scheduled skilled physical therapy due to unwillingness to participate and fatigue.   Therapy Documentation Precautions:  Precautions Precautions: Fall Precaution Comments: left scapula and left clavicle fractures Required Braces or Orthoses: Sling Restrictions Weight Bearing Restrictions: Yes LUE Weight Bearing: Non weight bearing Other Position/Activity Restrictions: per ortho notes: AROM allowed for LT UE General: PT Amount of Missed Time (min): 60 Minutes PT  Missed Treatment Reason: Patient unwilling to participate;Patient fatigue  See FIM for current functional status  Therapy/Group: Individual Therapy  Gilmore Laroche 05/04/2014, 8:52 AM

## 2014-05-04 NOTE — Progress Notes (Signed)
Occupational Therapy Note  Patient Details  Name: Jose Krueger MRN: [id number] Date of Birth: 1955/12/13  Today's Date: 05/04/2014 OT Missed Time: 27 Minutes Missed Time Reason: Patient unwilling/refused to participate without medical reason;Patient fatigue  Patient missing 60 min skilled OT secondary to unwilling to participate without medical reason and increased frustration. Patient received side lying in posey bed, not responding to therapist however eyes open. Attempted to engage in conversation and patient closing eyes with no response. Patient began cursing at therapist in raised voice. Attempted to redirect, however unsuccessful. Closed blinds and left room for 15 min to allow for de-escalation. Upon return patient in quadruped in bed. Unzipped posey bed and encouraged patient to sit on EOB or use bathroom. Patient again raised voice and began cursing at therapist and NT. Closed posey bed and left patient in bed to de-escalate. Notified RN of patient's status.    Nehal Shives N 05/04/2014, 2:45 PM

## 2014-05-04 NOTE — Progress Notes (Signed)
[facility] PHYSICAL MEDICINE & REHABILITATION     PROGRESS NOTE    Subjective/Complaints: More alert, but also more restless,impulsive  No new issues overnight. restless  Objective: Vital Signs: Blood pressure 159/96, pulse 96, temperature 98.6 F (37 C), temperature source Oral, resp. rate 18, weight 89.2 kg (196 lb 10.4 oz), SpO2 98.00%. No results found. No results found for this basename: WBC, HGB, HCT, PLT,  in the last 72 hours No results found for this basename: NA, K, CL, CO, GLUCOSE, BUN, CREATININE, CALCIUM,  in the last 72 hours CBG (last 3)  No results found for this basename: GLUCAP,  in the last 72 hours  Wt Readings from Last 3 Encounters:  05/03/14 89.2 kg (196 lb 10.4 oz)  04/30/14 83.416 kg (183 lb 14.4 oz)  04/30/14 83.416 kg (183 lb 14.4 oz)    Physical Exam:  Nursing note and vitals reviewed.  Constitutional: He appears well-developed and well-nourished. Cervical collar and nasal cannula in place.  Needed sternal rubs to awaken briefly. Listing to the right in bed and resisting attempts at repositioning. BUE in mittens. Moaning occasionally.  HENT:  Head: Normocephalic.  Neck:  Supple. Central line in place right neck. Left clavicle incision with steri strips in place (no sling)  Cardiovascular: Regular rhythm.  Respiratory: Tachypnea noted. He has rhonchi.  Wet upper airway sounds. ronchi with occasional wheeze  GI: Soft. Bowel sounds are normal. He exhibits no distension.  Musculoskeletal: He exhibits no edema.  LUE with long linear abraded area from shoulder to mid forearm with some scabbing. Bilateral patella with large abraded areas with loss of epidermal tissue with pink underlying tissue. Left with large   scab. Two additional abrasion on left lateral leg.  Neurological:  Remains   Confused. Limited eye contact. Does not recognize name with choice of two. Won't participate in examination. Appears to move all 4's     Assessment/Plan: 1.  Functional deficits secondary to TBI, polytrauma which require 3+ hours per day of interdisciplinary therapy in a comprehensive inpatient rehab setting. Physiatrist is providing close team supervision and 24 hour management of active medical problems listed below. Physiatrist and rehab team continue to assess barriers to discharge/monitor patient progress toward functional and medical goals. FIM: FIM - Bathing Bathing Steps Patient Completed: Chest;Front perineal area;Left Arm;Right Arm Bathing: 2: Max-Patient completes 3-4 15f 10 parts or 25-49%  FIM - Upper Body Dressing/Undressing Upper body dressing/undressing steps patient completed: Thread/unthread right sleeve of pullover shirt/dresss;Thread/unthread left sleeve of pullover shirt/dress;Put head through opening of pull over shirt/dress Upper body dressing/undressing: 4: Min-Patient completed 75 plus % of tasks FIM - Lower Body Dressing/Undressing Lower body dressing/undressing steps patient completed: Pull underwear up/down;Thread/unthread left underwear leg;Thread/unthread right underwear leg Lower body dressing/undressing: 3: Mod-Patient completed 50-74% of tasks  FIM - Musician Devices: Grab bar or rail for support Toileting: 1: Total-Patient completed zero steps, helper did all 3  FIM - Radio producer Devices: Elevated toilet seat;Grab bars Toilet Transfers: 3-From toilet/BSC: Mod A (lift or lower assist);3-To toilet/BSC: Mod A (lift or lower assist)  FIM - Bed/Chair Transfer Bed/Chair Transfer Assistive Devices: HOB elevated;Bed rails;Arm rests Bed/Chair Transfer: 4: Supine > Sit: Min A (steadying Pt. > 75%/lift 1 leg);4: Bed > Chair or W/C: Min A (steadying Pt. > 75%);4: Chair or W/C > Bed: Min A (steadying Pt. > 75%);3: Sit > Supine: Mod A (lifting assist/Pt. 50-74%/lift 2 legs)  FIM - Locomotion: Wheelchair Locomotion: Wheelchair: 1: Total Assistance/staff  pushes wheelchair  (Pt<25%) FIM - Locomotion: Ambulation Locomotion: Ambulation Assistive Devices: Other (comment) (R HHA) Ambulation/Gait Assistance: 4: Min assist Locomotion: Ambulation: 4: Travels 150 ft or more with minimal assistance (Pt.>75%)  Comprehension Comprehension Mode: Auditory Comprehension: 1-Understands basic less than 25% of the time/requires cueing 75% of the time  Expression Expression Mode: Verbal Expression: 1-Expresses basis less than 25% of the time/requires cueing greater than 75% of the time.  Social Interaction Social Interaction: 1-Interacts appropriately less than 25% of the time. May be withdrawn or combative.  Problem Solving Problem Solving: 1-Solves basic less than 25% of the time - needs direction nearly all the time or does not effectively solve problems and may need a restraint for safety  Memory Memory: 1-Recognizes or recalls less than 25% of the time/requires cueing greater than 75% of the time  Medical Problem List and Plan:  1. Functional deficits secondary to TBI  2. DVT Prophylaxis/Anticoagulation: Mechanical: Antiembolism stockings, thigh (TED hose) Bilateral lower extremities  Sequential compression devices, below knee Bilateral lower extremities  3. Pain Management: Monitor for outward signs of distress and medicate as appropriate.  4. Mood: Not able to determine at this time. H/o anxiety/depression/withdrawal with recommendation for outpatient treatment 02/2014. Will monitor as mentation improves. LCSW to follow along for evaluation/support when appropriate.  5. Neuropsych: This patient is not capable of making decisions on his own behalf.   -vail bed  -added low dose ritalin for arousal  -tegretol for mood stabilization 6. Skin/Wound Care: Monitor multiple abrasions on extremities for healing. Pressure relief measures. Nutritional supplement to promote wound healing. Question po intake with fluctuating mental status.  7. Advanced Cirrhosis of liver/Liver  mass?: Continue lactulose tid. ammonia levels pending  -. MRI 12/2010 with two small suspicious liver lesions with recommendations for follow up at that time. (Had seen Dr. Benson [place] 2012 [place])  8. Left clavicle fracture: NWB LUE. Sling for support as able.  9. ABLA: Will recheck labs in am. Question Vit B 12 deficiency with elevate MCV. Check levels in am.  10. Activation: amantadine, add ritalin  -  11. Vitamin B 12 deficiency   - labs ok, keep an eye on sodium level  LOS (Days) 4 A FACE TO FACE EVALUATION WAS PERFORMED  Quadasia Newsham T 05/04/2014 8:07 AM

## 2014-05-04 NOTE — Progress Notes (Signed)
Speech Language Pathology Daily Session Note  Patient Details  Name: Jose Krueger MRN: [id number] Date of Birth: [birth year]  Today's Date: 05/04/2014 SLP Individual Time: 1130-1200 SLP Individual Time Calculation (min): 30 min  Short Term Goals: Week 1: SLP Short Term Goal 1 (Week 1): Pt will utilize external aids to orient to place, date, and situation with mod-max assist  SLP Short Term Goal 2 (Week 1): Pt will sustain attention to basic tasks for 2-3 minutes with mod-max assist for redirection.  SLP Short Term Goal 3 (Week 1): Pt will tolerate his currently prescribed diet with no overt s/s of aspiration and mod assist for use of swallowing precautions.  SLP Short Term Goal 4 (Week 1): Pt will use external aids to improve immediate recall of basic, daily information for 75% accuracy with max assist.  SLP Short Term Goal 5 (Week 1): Pt will return demonstration of call bell to indicate needs/wants with nursing for 25% of opportunities with max assist.    Skilled Therapeutic Interventions: Skilled treatment session focused on addressing dysphagia goals.  SLP facilitated session with set-up assist and intermittent assist with scooping and self-feeding of regular textures and thin liquids via straw.  Patient demonstrated no overt s/s of aspiration throughout trials.   Patient with increased willingness to participate and therefore increased intake today.  Recommend to continue with current plan of care.   FIM:  Comprehension Comprehension Mode: Auditory Comprehension: 1-Understands basic less than 25% of the time/requires cueing 75% of the time Expression Expression Mode: Verbal Expression: 2-Expresses basic 25 - 49% of the time/requires cueing 50 - 75% of the time. Uses single words/gestures. Social Interaction Social Interaction: 2-Interacts appropriately 25 - 49% of time - Needs frequent redirection. Problem Solving Problem Solving: 1-Solves basic less than 25% of the time - needs  direction nearly all the time or does not effectively solve problems and may need a restraint for safety Memory Memory: 1-Recognizes or recalls less than 25% of the time/requires cueing greater than 75% of the time FIM - Eating Eating Activity: 4: Helper occasionally brings food to mouth;4: Help with managing cup/glass;4: Help with picking up utensils;4: Helper occasionally scoops food on utensil;5: Needs verbal cues/supervision  Pain Pain Assessment Pain Assessment: No/denies pain  Therapy/Group: Individual Therapy  Carmelia Roller., [place] [phone]  [place] 05/04/2014, 4:15 PM

## 2014-05-05 ENCOUNTER — Inpatient Hospital Stay (HOSPITAL_COMMUNITY): Payer: Medicaid Other | Admitting: Speech Pathology

## 2014-05-05 ENCOUNTER — Inpatient Hospital Stay (HOSPITAL_COMMUNITY): Payer: Self-pay | Admitting: *Deleted

## 2014-05-05 ENCOUNTER — Inpatient Hospital Stay (HOSPITAL_COMMUNITY): Payer: Self-pay | Admitting: Physical Therapy

## 2014-05-05 MED ORDER — CARBAMAZEPINE 200 MG PO TABS
200.0000 mg | ORAL_TABLET | Freq: Three times a day (TID) | ORAL | Status: DC
  Administered 2014-05-05 – 2014-05-23 (×50): 200 mg via ORAL
  Filled 2014-05-05 (×58): qty 1

## 2014-05-05 MED ORDER — QUETIAPINE FUMARATE 50 MG PO TABS: 50.0000 mg | ORAL_TABLET | Freq: Every day | ORAL | Status: DC

## 2014-05-05 MED ORDER — QUETIAPINE FUMARATE 50 MG PO TABS
50.0000 mg | ORAL_TABLET | Freq: Every day | ORAL | Status: DC
  Administered 2014-05-05 – 2014-05-08 (×4): 50 mg via ORAL
  Filled 2014-05-05 (×6): qty 1

## 2014-05-05 NOTE — Progress Notes (Signed)
[place] PHYSICAL MEDICINE & REHABILITATION     PROGRESS NOTE    Subjective/Complaints: More alert, but also more restless,impulsive , calling out No new issues overnight. restless  Objective: Vital Signs: Blood pressure 159/88, pulse 79, temperature 97.5 F (36.4 C), temperature source Oral, resp. rate 18, weight 89.2 kg (196 lb 10.4 oz), SpO2 98.00%. No results found. No results found for this basename: WBC, HGB, HCT, PLT,  in the last 72 hours No results found for this basename: NA, K, CL, CO, GLUCOSE, BUN, CREATININE, CALCIUM,  in the last 72 hours CBG (last 3)  No results found for this basename: GLUCAP,  in the last 72 hours  Wt Readings from Last 3 Encounters:  05/03/14 89.2 kg (196 lb 10.4 oz)  04/30/14 83.416 kg (183 lb 14.4 oz)  04/30/14 83.416 kg (183 lb 14.4 oz)    Physical Exam:  Nursing note and vitals reviewed.  Constitutional: He appears well-developed and well-nourished. Cervical collar and nasal cannula in place.  Needed sternal rubs to awaken briefly. Listing to the right in bed and resisting attempts at repositioning. BUE in mittens. Moaning occasionally.  HENT:  Head: Normocephalic.  Neck:  Supple. Central line in place right neck. Left clavicle incision with steri strips in place (no sling)  Cardiovascular: Regular rhythm.  Respiratory: Tachypnea noted. He has rhonchi.  Wet upper airway sounds. ronchi with occasional wheeze  GI: Soft. Bowel sounds are normal. He exhibits no distension.  Musculoskeletal: He exhibits no edema.  LUE with long linear abraded area from shoulder to mid forearm with some scabbing. Bilateral patella with large abraded areas with loss of epidermal tissue with pink underlying tissue. Left with large   scab. Two additional abrasion on left lateral leg.  Neurological:  Remains   Confused. Limited eye contact. Does not recognize name with choice of two. Won't participate in examination. Appears to move all  4's     Assessment/Plan: 1. Functional deficits secondary to TBI, polytrauma which require 3+ hours per day of interdisciplinary therapy in a comprehensive inpatient rehab setting. Physiatrist is providing close team supervision and 24 hour management of active medical problems listed below. Physiatrist and rehab team continue to assess barriers to discharge/monitor patient progress toward functional and medical goals. FIM: FIM - Bathing Bathing Steps Patient Completed: Chest;Front perineal area;Left Arm;Right Arm Bathing: 1: Total-Patient completes 0-2 of 10 parts or less than 25%  FIM - Upper Body Dressing/Undressing Upper body dressing/undressing steps patient completed: Thread/unthread right sleeve of pullover shirt/dresss;Thread/unthread left sleeve of pullover shirt/dress;Put head through opening of pull over shirt/dress;Pull shirt over trunk Upper body dressing/undressing: 5: Set-up assist to: Obtain clothing/put away FIM - Lower Body Dressing/Undressing Lower body dressing/undressing steps patient completed: Thread/unthread right pants leg;Thread/unthread left pants leg;Don/Doff right sock;Don/Doff left sock;Don/Doff right shoe;Don/Doff left shoe Lower body dressing/undressing: 3: Mod-Patient completed 50-74% of tasks (max cues and more then reasonable amount of time)  FIM - Musician Devices: Grab bar or rail for support Toileting: 1: Total-Patient completed zero steps, helper did all 3  FIM - Radio producer Devices: Elevated toilet seat;Grab bars Toilet Transfers: 1-Two helpers (min-mod assist +2 for safety)  FIM - Control and instrumentation engineer Devices: HOB elevated;Bed rails;Arm rests Bed/Chair Transfer: 0: Activity did not occur  FIM - Locomotion: Wheelchair Locomotion: Wheelchair: 0: Activity did not occur FIM - Locomotion: Ambulation Locomotion: Ambulation Assistive Devices: Other (comment) (R  HHA) Ambulation/Gait Assistance: 4: Min assist Locomotion: Ambulation: 0: Activity did not  occur  Comprehension Comprehension Mode: Auditory Comprehension: 1-Understands basic less than 25% of the time/requires cueing 75% of the time  Expression Expression Mode: Verbal Expression: 2-Expresses basic 25 - 49% of the time/requires cueing 50 - 75% of the time. Uses single words/gestures.  Social Interaction Social Interaction: 2-Interacts appropriately 25 - 49% of time - Needs frequent redirection.  Problem Solving Problem Solving: 1-Solves basic less than 25% of the time - needs direction nearly all the time or does not effectively solve problems and may need a restraint for safety  Memory Memory: 1-Recognizes or recalls less than 25% of the time/requires cueing greater than 75% of the time  Medical Problem List and Plan:  1. Functional deficits secondary to TBI  2. DVT Prophylaxis/Anticoagulation: Mechanical: Antiembolism stockings, thigh (TED hose) Bilateral lower extremities  Sequential compression devices, below knee Bilateral lower extremities  3. Pain Management: Monitor for outward signs of distress and medicate as appropriate.  4. Mood: Not able to determine at this time. H/o anxiety/depression/withdrawal with recommendation for outpatient treatment 02/2014. Will monitor as mentation improves. LCSW to follow along for evaluation/support when appropriate.  5. Neuropsych: This patient is not capable of making decisions on his own behalf.   -vail bed  -added low dose ritalin for arousal  -tegretol for mood stabilization, increase  -add seroquel at night 6. Skin/Wound Care: Monitor multiple abrasions on extremities for healing. Pressure relief measures. Nutritional supplement to promote wound healing. Question po intake with fluctuating mental status.  7. Advanced Cirrhosis of liver/Liver mass?: Continue lactulose tid. ammonia levels pending  -. MRI 12/2010 with two small suspicious  liver lesions with recommendations for follow up at that time. (Had seen Dr. Benson Norway 2012 [place])  8. Left clavicle fracture: NWB LUE. Sling for support as able.  9. ABLA: Will recheck labs in am. Question Vit B 12 deficiency with elevate MCV. Check levels in am.  10. Activation: amantadine, add ritalin  -  11. Vitamin B 12 deficiency   - labs ok, keep an eye on sodium level  LOS (Days) 5 A FACE TO FACE EVALUATION WAS PERFORMED  SWARTZ,ZACHARY T 05/05/2014 9:18 AM

## 2014-05-05 NOTE — Progress Notes (Signed)
Physical Therapy Session Note  Patient Details  Name: Jose Krueger MRN: [id number] Date of Birth: [birth year]  Today's Date: 05/05/2014 PT Individual Time: 1505-1550 PT Individual Time Calculation (min): 45 min   Short Term Goals: Week 1:  PT Short Term Goal 1 (Week 1): Patient will follow one step commands with 75% accuracy. PT Short Term Goal 2 (Week 1): Patient will demonstrate sustained attention x10" in controlled environment with max cues. PT Short Term Goal 3 (Week 1): Patient will perform functional transfers with modA x1. PT Short Term Goal 4 (Week 1): Patient will perform bed mobility from flat bed with supervision. PT Short Term Goal 5 (Week 1): Patient will ambulate 60' with LRAD and modA x1.  Skilled Therapeutic Interventions/Progress Updates:    Session focused on arousal, initiation, sustained attention, orientation, and command following with functional mobility. Pt received asleep in enclosure bed. Difficult to arouse patient with max multimodal cues with patient yelling, "What do you want, woman?" and refusing to get OOB with eyes maintained closed for first 30 min of session. With max coaxing, patient finally stating, "I'll do it if I have to," in regards to getting OOB. Pt requires max +2 assist to transfer from posey bed to w/c. Pt taken to day room in w/c, total A to don LUE sling. Pt refused to attempt to stand or ambulate, yelling out in pain if therapist attempted to initiate movement with tactile cues at trunk. Remainder of session focused on orientation. Pt oriented to name and birthdate, total A for orientation to time, place, situation. Pt with increased language of confusion after being re-oriented to situation. Patient left sitting in w/c with QRB on at RN station.   Therapy Documentation Precautions:  Precautions Precautions: Fall Precaution Comments: left scapula and left clavicle fractures Required Braces or Orthoses: Sling Restrictions Weight Bearing  Restrictions: Yes LUE Weight Bearing: Non weight bearing Other Position/Activity Restrictions: per ortho notes: AROM allowed for LT UE Vital Signs: Therapy Vitals Temp: 98.3 F (36.8 C) Temp src: Oral Pulse Rate: 81 Resp: 16 BP: 101/50 mmHg Patient Position (if appropriate): Sitting Oxygen Therapy SpO2: 100 % O2 Device: None (Room air) Pain: Unrated back pain, RN made aware  See FIM for current functional status  Therapy/Group: Individual Therapy  Laretta Alstrom 05/05/2014, 5:09 PM

## 2014-05-05 NOTE — Progress Notes (Signed)
Speech Language Pathology Daily Session Note  Patient Details  Name: Jose Krueger MRN: [id number] Date of Birth: [birth year]  Today's Date: 05/05/2014 SLP Individual Time: 1100-1130 SLP Individual Time Calculation (min): 30 min  Short Term Goals: Week 1: SLP Short Term Goal 1 (Week 1): Pt will utilize external aids to orient to place, date, and situation with mod-max assist  SLP Short Term Goal 2 (Week 1): Pt will sustain attention to basic tasks for 2-3 minutes with mod-max assist for redirection.  SLP Short Term Goal 3 (Week 1): Pt will tolerate his currently prescribed diet with no overt s/s of aspiration and mod assist for use of swallowing precautions.  SLP Short Term Goal 4 (Week 1): Pt will use external aids to improve immediate recall of basic, daily information for 75% accuracy with max assist.  SLP Short Term Goal 5 (Week 1): Pt will return demonstration of call bell to indicate needs/wants with nursing for 25% of opportunities with max assist.    Skilled Therapeutic Interventions: Skilled treatment session focused on cognitive goals. Upon arrival, patient was sitting upright in his enclosure bed and the patient declined transferring to the wheelchair due to a short session. SLP facilitated session by providing total A for orientation to place and time, however, patient was independently oriented to situation.  Throughout a functional conversation, but required total A for intellectual awareness of physical and cognitive deficits and demonstrated decreased topic maintenance with language of confusion which was difficult to redirect.  Patient appeared lethargic throughout the session but did not report any pain. Patient left in enclosure bed with his call bell within reach. Continue with current plan of care.    FIM:  Comprehension Comprehension Mode: Auditory Comprehension: 1-Understands basic less than 25% of the time/requires cueing 75% of the time Expression Expression Mode:  Verbal Expression: 2-Expresses basic 25 - 49% of the time/requires cueing 50 - 75% of the time. Uses single words/gestures. Social Interaction Social Interaction: 2-Interacts appropriately 25 - 49% of time - Needs frequent redirection. Problem Solving Problem Solving: 1-Solves basic less than 25% of the time - needs direction nearly all the time or does not effectively solve problems and may need a restraint for safety Memory Memory: 1-Recognizes or recalls less than 25% of the time/requires cueing greater than 75% of the time  Pain Pain Assessment Pain Assessment: No/denies pain  Therapy/Group: Individual Therapy  Rei Medlen 05/05/2014, 1:25 PM

## 2014-05-06 ENCOUNTER — Inpatient Hospital Stay (HOSPITAL_COMMUNITY): Payer: Medicaid Other | Admitting: Occupational Therapy

## 2014-05-06 ENCOUNTER — Inpatient Hospital Stay (HOSPITAL_COMMUNITY): Payer: Self-pay | Admitting: Physical Therapy

## 2014-05-06 DIAGNOSIS — S2249XA Multiple fractures of ribs, unspecified side, initial encounter for closed fracture: Secondary | ICD-10-CM

## 2014-05-06 DIAGNOSIS — F1010 Alcohol abuse, uncomplicated: Secondary | ICD-10-CM

## 2014-05-06 DIAGNOSIS — S069XAA Unspecified intracranial injury with loss of consciousness status unknown, initial encounter: Secondary | ICD-10-CM

## 2014-05-06 DIAGNOSIS — S42009A Fracture of unspecified part of unspecified clavicle, initial encounter for closed fracture: Secondary | ICD-10-CM

## 2014-05-06 DIAGNOSIS — S069X9A Unspecified intracranial injury with loss of consciousness of unspecified duration, initial encounter: Secondary | ICD-10-CM

## 2014-05-06 NOTE — Progress Notes (Signed)
Occupational Therapy Session Note  Patient Details  Name: Jose Krueger MRN: [id number] Date of Birth: [birth year]  Today's Date: 05/06/2014 OT Individual Time: 0800-0900 OT Individual Time Calculation (min): 60 min   Short Term Goals: Week 1:  OT Short Term Goal 1 (Week 1): Patient will demonstrate focused attention to self-care task for 10 seconds OT Short Term Goal 2 (Week 1): Patient will wash 5/10 body parts with mod cues   OT Short Term Goal 3 (Week 1): Patient will follow 1 step commands 50% of time during functional activity  OT Short Term Goal 4 (Week 1): Patient will complete functional transfers with max assist   Skilled Therapeutic Interventions/Progress Updates:  Patient resting in enclosure bed upon arrival.  Engaged in self care retraining to include sponge bath at sink, toileting, dressing and self feeding.  Focused session on following commands, focus attention to self care tasks for 10-30 seconds before internally or externally distracted, orientation (states his father is president of [place] and his mother is VP, only able to state his name and unable to state, place, situation or time/date).  Patient required mod-max multimodal cues not to bear weight into his LUE during mobility and BADL tasks.  Patient min assist bed>w/c to sponge bathe and dress at sink then ambulated from sink with min assist to stand at toilet to urinate.  Patient brought to nursing station secondary to staff not available to assist to finish breakfast at this time.  Therapy Documentation Precautions:  Precautions Precautions: Fall Precaution Comments: left scapula and left clavicle fractures Required Braces or Orthoses: Sling Restrictions Weight Bearing Restrictions: Yes LUE Weight Bearing: Non weight bearing Other Position/Activity Restrictions: per ortho notes: AROM allowed for LT UE Pain: Significant pain at rest and with mobility in left ribcage around to his mid-back.  Not rated, RN aware and  medication provided.   ADL: See FIM for current functional status  Therapy/Group: Individual Therapy  [name], [place] 05/06/2014, 7:41 AM

## 2014-05-06 NOTE — Progress Notes (Signed)
[place] PHYSICAL MEDICINE & REHABILITATION     PROGRESS NOTE    Subjective/Complaints: Awake, somewhat restless. Appears less agitated No new issues overnight.   Objective: Vital Signs: Blood pressure 151/72, pulse 81, temperature 98.7 F (37.1 C), temperature source Oral, resp. rate 18, weight 89.2 kg (196 lb 10.4 oz), SpO2 98.00%. No results found. No results found for this basename: WBC, HGB, HCT, PLT,  in the last 72 hours No results found for this basename: NA, K, CL, CO, GLUCOSE, BUN, CREATININE, CALCIUM,  in the last 72 hours CBG (last 3)  No results found for this basename: GLUCAP,  in the last 72 hours  Wt Readings from Last 3 Encounters:  05/03/14 89.2 kg (196 lb 10.4 oz)  04/30/14 83.416 kg (183 lb 14.4 oz)  04/30/14 83.416 kg (183 lb 14.4 oz)    Physical Exam:  Nursing note and vitals reviewed.  Constitutional: He appears well-developed and well-nourished. Cervical collar and nasal cannula in place.  Needed sternal rubs to awaken briefly. Listing to the right in bed and resisting attempts at repositioning. BUE in mittens. Moaning occasionally.  HENT:  Head: Normocephalic.  Neck:  Supple. Central line in place right neck. Left clavicle incision with steri strips in place (no sling)  Cardiovascular: Regular rhythm.  Respiratory: Tachypnea noted. He has rhonchi.  Wet upper airway sounds. ronchi with occasional wheeze  GI: Soft. Bowel sounds are normal. He exhibits no distension.  Musculoskeletal: He exhibits no edema.  LUE with long linear abraded area from shoulder to mid forearm with some scabbing. Bilateral patella with large abraded areas with loss of epidermal tissue with pink underlying tissue. Left with large   scab. Two additional abrasion on left lateral leg.  Neurological:  Remains   Confused. Limited eye contact. Does not recognize name with choice of two. Won't participate in examination. Appears to move all 4's     Assessment/Plan: 1. Functional  deficits secondary to TBI, polytrauma which require 3+ hours per day of interdisciplinary therapy in a comprehensive inpatient rehab setting. Physiatrist is providing close team supervision and 24 hour management of active medical problems listed below. Physiatrist and rehab team continue to assess barriers to discharge/monitor patient progress toward functional and medical goals. FIM: FIM - Bathing Bathing Steps Patient Completed: Chest;Front perineal area;Left Arm;Right Arm Bathing: 1: Total-Patient completes 0-2 of 10 parts or less than 25%  FIM - Upper Body Dressing/Undressing Upper body dressing/undressing steps patient completed: Thread/unthread right sleeve of pullover shirt/dresss;Thread/unthread left sleeve of pullover shirt/dress;Put head through opening of pull over shirt/dress;Pull shirt over trunk Upper body dressing/undressing: 5: Set-up assist to: Obtain clothing/put away FIM - Lower Body Dressing/Undressing Lower body dressing/undressing steps patient completed: Thread/unthread right pants leg;Thread/unthread left pants leg;Don/Doff right sock;Don/Doff left sock;Don/Doff right shoe;Don/Doff left shoe Lower body dressing/undressing: 3: Mod-Patient completed 50-74% of tasks (max cues and more then reasonable amount of time)  FIM - Musician Devices: Grab bar or rail for support Toileting: 1: Total-Patient completed zero steps, helper did all 3  FIM - Radio producer Devices: Elevated toilet seat;Grab bars Toilet Transfers: 1-Two helpers  FIM - Control and instrumentation engineer Devices: HOB elevated;Bed rails;Arm rests Bed/Chair Transfer: 1: Two helpers  FIM - Locomotion: Wheelchair Locomotion: Wheelchair: 1: Total Assistance/staff pushes wheelchair (Pt<25%) FIM - Locomotion: Ambulation Locomotion: Ambulation Assistive Devices: Other (comment) (R HHA) Ambulation/Gait Assistance: 4: Min assist Locomotion:  Ambulation: 0: Activity did not occur  Comprehension Comprehension Mode: Auditory Comprehension: 1-Understands basic  less than 25% of the time/requires cueing 75% of the time  Expression Expression Mode: Verbal Expression: 2-Expresses basic 25 - 49% of the time/requires cueing 50 - 75% of the time. Uses single words/gestures.  Social Interaction Social Interaction: 2-Interacts appropriately 25 - 49% of time - Needs frequent redirection.  Problem Solving Problem Solving: 2-Solves basic 25 - 49% of the time - needs direction more than half the time to initiate, plan or complete simple activities  Memory Memory: 2-Recognizes or recalls 25 - 49% of the time/requires cueing 51 - 75% of the time  Medical Problem List and Plan:  1. Functional deficits secondary to TBI  2. DVT Prophylaxis/Anticoagulation: Mechanical: Antiembolism stockings, thigh (TED hose) Bilateral lower extremities  Sequential compression devices, below knee Bilateral lower extremities  3. Pain Management: Monitor for outward signs of distress and medicate as appropriate.  4. Mood: Not able to determine at this time. H/o anxiety/depression/withdrawal with recommendation for outpatient treatment 02/2014. Will monitor as mentation improves. LCSW to follow along for evaluation/support when appropriate.  5. Neuropsych: This patient is not capable of making decisions on his own behalf.   -vail bed  -added low dose ritalin for arousal  -tegretol for mood stabilization, increased  -added seroquel at night 6. Skin/Wound Care: Monitor multiple abrasions on extremities for healing. Pressure relief measures. Nutritional supplement to promote wound healing. Question po intake with fluctuating mental status.  7. Advanced Cirrhosis of liver/Liver mass?: Continue lactulose tid. ammonia levels pending  -MRI 12/2010 with two small suspicious liver lesions with recommendations for follow up at that time. (Had seen Dr. Benson Norway 2012 [place])   8. Left clavicle fracture: NWB LUE. Sling for support as able.  9. ABLA: Will recheck labs in am. Question Vit B 12 deficiency with elevate MCV. Check levels in am.  10. Activation: amantadine, add ritalin  -  11. Vitamin B 12 deficiency   - labs ok, keep an eye on sodium level  LOS (Days) 6 A FACE TO FACE EVALUATION WAS PERFORMED  Maile Linford T 05/06/2014 8:52 AM

## 2014-05-06 NOTE — Progress Notes (Signed)
Physical Therapy Note  Patient Details  Name: WEBER MONNIER MRN: [id number] Date of Birth: Mar 05, 1956 Today's Date: 05/06/2014    Time: 1050-1135 45 minutes  1:1 Pt initially with no c/o pain, then at end of session c/o back pain.  Pt repositioned and given chance to rest, RN made aware.  Pt unwilling to participate in final 15 minutes of session due to back pain.  Pt able to gait with min A 150' x 2 with 1 LOB requiring min A to correct.  Standing balance/attention activity with horseshoe toss. Pt min A for balance and attention to task.  Pt asked to write his name.  Pt able to write name and recall correct date with mod cuing.  Pt not oriented to situation despite being told by therapist at beginning of session.  Pt min A for transfers mat <> w/c multiple attempts.  Attempted to engage pt in card game, pt distracted by back pain, unable to participate.  Time 2: 1300  Pt missed 60 minutes skilled PT due to pt c/o stomach pain and fatigue, RN made aware.  Pt encouraged to participate with multiple attempts, pt unwilling to get out of w/c.  RN aware.   Gaia Gullikson 05/06/2014, 11:33 AM

## 2014-05-07 ENCOUNTER — Inpatient Hospital Stay (HOSPITAL_COMMUNITY): Payer: Medicaid Other | Admitting: Speech Pathology

## 2014-05-07 ENCOUNTER — Inpatient Hospital Stay (HOSPITAL_COMMUNITY): Payer: Medicaid Other | Admitting: *Deleted

## 2014-05-07 ENCOUNTER — Inpatient Hospital Stay (HOSPITAL_COMMUNITY): Payer: Self-pay

## 2014-05-07 ENCOUNTER — Encounter (HOSPITAL_COMMUNITY): Payer: Self-pay | Admitting: Occupational Therapy

## 2014-05-07 ENCOUNTER — Inpatient Hospital Stay (HOSPITAL_COMMUNITY): Payer: Self-pay | Admitting: *Deleted

## 2014-05-07 NOTE — Progress Notes (Signed)
[place] PHYSICAL MEDICINE & REHABILITATION     PROGRESS NOTE    Subjective/Complaints: Agitated and restless at times.  No new issues overnight.   Objective: Vital Signs: Blood pressure 119/64, pulse 78, temperature 98.6 F (37 C), temperature source Oral, resp. rate 18, weight 89.2 kg (196 lb 10.4 oz), SpO2 98.00%. No results found. No results found for this basename: WBC, HGB, HCT, PLT,  in the last 72 hours No results found for this basename: NA, K, CL, CO, GLUCOSE, BUN, CREATININE, CALCIUM,  in the last 72 hours CBG (last 3)  No results found for this basename: GLUCAP,  in the last 72 hours  Wt Readings from Last 3 Encounters:  05/03/14 89.2 kg (196 lb 10.4 oz)  04/30/14 83.416 kg (183 lb 14.4 oz)  04/30/14 83.416 kg (183 lb 14.4 oz)    Physical Exam:  Nursing note and vitals reviewed.  Constitutional: He appears well-developed and well-nourished. Cervical collar and nasal cannula in place.  Needed sternal rubs to awaken briefly. Listing to the right in bed and resisting attempts at repositioning. BUE in mittens. Moaning occasionally.  HENT:  Head: Normocephalic.  Neck:  Supple. Central line in place right neck. Left clavicle incision with steri strips in place (no sling)  Cardiovascular: Regular rhythm.  Respiratory: Tachypnea noted. He has rhonchi.  Wet upper airway sounds. ronchi with occasional wheeze  GI: Soft. Bowel sounds are normal. He exhibits no distension.  Musculoskeletal: He exhibits no edema.  LUE with  abraded area from shoulder to mid forearm with some scabbing. Bilateral patella with large abraded areas with loss of epidermal tissue with pink underlying tissue. Left with large   scab. Two additional abrasion on left lateral leg.  Neurological:  Remains   Confused. Limited eye contact. Does not recognize name with choice of two. Won't participate in examination. moves all 4's     Assessment/Plan: 1. Functional deficits secondary to TBI, polytrauma  which require 3+ hours per day of interdisciplinary therapy in a comprehensive inpatient rehab setting. Physiatrist is providing close team supervision and 24 hour management of active medical problems listed below. Physiatrist and rehab team continue to assess barriers to discharge/monitor patient progress toward functional and medical goals. FIM: FIM - Bathing Bathing Steps Patient Completed: Chest;Front perineal area;Left Arm;Right Arm;Abdomen Bathing: 3: Mod-Patient completes 5-7 34f 10 parts or 50-74%  FIM - Upper Body Dressing/Undressing Upper body dressing/undressing steps patient completed: Thread/unthread right sleeve of pullover shirt/dresss;Thread/unthread left sleeve of pullover shirt/dress;Put head through opening of pull over shirt/dress;Pull shirt over trunk Upper body dressing/undressing: 5: Set-up assist to: Obtain clothing/put away FIM - Lower Body Dressing/Undressing Lower body dressing/undressing steps patient completed: Thread/unthread right pants leg;Thread/unthread left pants leg;Don/Doff right sock;Don/Doff left sock;Don/Doff right shoe;Don/Doff left shoe Lower body dressing/undressing: 1: Total-Patient completed less than 25% of tasks (Patient stating that he could not put on his pants)  FIM - Toileting Toileting Assistive Devices: Grab bar or rail for support Toileting: 4: Steadying assist (standing at commode to urinate with no clothes on )  FIM - Radio producer Devices: Grab bars Toilet Transfers: 4-To toilet/BSC: Min A (steadying Pt. > 75%) (ambulated into bathroom and stood at toilet to urinate)  FIM - Engineer, site Assistive Devices: HOB elevated;Bed rails;Arm rests Bed/Chair Transfer: 4: Supine > Sit: Min A (steadying Pt. > 75%/lift 1 leg);3: Bed > Chair or W/C: Mod A (lift or lower assist)  FIM - Locomotion: Wheelchair Locomotion: Wheelchair: 1: Total Assistance/staff pushes  wheelchair (Pt<25%) FIM -  Locomotion: Ambulation Locomotion: Ambulation Assistive Devices: Other (comment) (R HHA) Ambulation/Gait Assistance: 4: Min assist Locomotion: Ambulation: 0: Activity did not occur  Comprehension Comprehension Mode: Auditory Comprehension: 1-Understands basic less than 25% of the time/requires cueing 75% of the time  Expression Expression Mode: Verbal Expression: 2-Expresses basic 25 - 49% of the time/requires cueing 50 - 75% of the time. Uses single words/gestures.  Social Interaction Social Interaction: 2-Interacts appropriately 25 - 49% of time - Needs frequent redirection.  Problem Solving Problem Solving: 1-Solves basic less than 25% of the time - needs direction nearly all the time or does not effectively solve problems and may need a restraint for safety  Memory Memory: 1-Recognizes or recalls less than 25% of the time/requires cueing greater than 75% of the time  Medical Problem List and Plan:  1. Functional deficits secondary to TBI  2. DVT Prophylaxis/Anticoagulation: Mechanical: Antiembolism stockings, thigh (TED hose) Bilateral lower extremities  Sequential compression devices, below knee Bilateral lower extremities  3. Pain Management: Monitor for outward signs of distress and medicate as appropriate.  4. Mood: Not able to determine at this time. H/o anxiety/depression/withdrawal with recommendation for outpatient treatment 02/2014. Will monitor as mentation improves. LCSW to follow along for evaluation/support when appropriate.  5. Neuropsych: This patient is not capable of making decisions on his own behalf.   -vail bed  -added low dose ritalin for arousal  -tegretol for mood stabilization, increased  -added seroquel at night which seems to have helped 6. Skin/Wound Care: Monitor multiple abrasions on extremities for healing. Pressure relief measures. Nutritional supplement to promote wound healing. Question po intake with fluctuating mental status.  7. Advanced  Cirrhosis of liver/Liver mass?: Continue lactulose tid. ammonia levels pending  -MRI 12/2010 with two small suspicious liver lesions with recommendations for follow up at that time. (Had seen Dr. Benson [place] 2012 [place])  8. Left clavicle fracture: NWB LUE. Sling for support as able.  9. ABLA: Will recheck labs in am. Question Vit B 12 deficiency with elevate MCV. Check levels in am.  10. Activation: amantadine, added ritalin 11. Vitamin B 12 deficiency   - labs ok, keep an eye on sodium level  LOS (Days) 7 A FACE TO FACE EVALUATION WAS PERFORMED  SWARTZ,ZACHARY T 05/07/2014 8:15 AM

## 2014-05-07 NOTE — Progress Notes (Signed)
Speech Language Pathology Daily Session Note  Patient Details  Name: Jose Krueger MRN: [id number] Date of Birth: 1956/04/07  Today's Date: 05/07/2014 SLP Individual Time: 0800-0900 SLP Individual Time Calculation (min): 60 min  Short Term Goals: Week 1: SLP Short Term Goal 1 (Week 1): Pt will utilize external aids to orient to place, date, and situation with mod-max assist  SLP Short Term Goal 2 (Week 1): Pt will sustain attention to basic tasks for 2-3 minutes with mod-max assist for redirection.  SLP Short Term Goal 3 (Week 1): Pt will tolerate his currently prescribed diet with no overt s/s of aspiration and mod assist for use of swallowing precautions.  SLP Short Term Goal 4 (Week 1): Pt will use external aids to improve immediate recall of basic, daily information for 75% accuracy with max assist.  SLP Short Term Goal 5 (Week 1): Pt will return demonstration of call bell to indicate needs/wants with nursing for 25% of opportunities with max assist.    Skilled Therapeutic Interventions: Skilled treatment session focused on cognitive-linguistic and dysphagia goals. SLP facilitated session by providing Max encouragement with verbal and tactile cues for patient to get out of bed and transfer to wheelchair for participation in therapy session.  The patient required Max multimodal cues and extra time for tray set-up and consumed breakfast meal of Dys. 3 textures with thin liquids with Mod A multimodal cues for utilization of swallowing compensatory strategies. Patient demonstrated an intermittent wet vocal quality after large sips of thin liquids that patient cleared with intermittent verbal cues.  Patient required Max multimodal cues for focused attention to self-feeding due to verbal perseveration which was difficult to redirect and demonstrated language of confusion throughout the session.  Patient left with NT to complete breakfast. Continue with current plan of care.    FIM:   Comprehension Comprehension Mode: Auditory Comprehension: 2-Understands basic 25 - 49% of the time/requires cueing 51 - 75% of the time Expression Expression Mode: Verbal Expression: 2-Expresses basic 25 - 49% of the time/requires cueing 50 - 75% of the time. Uses single words/gestures. Social Interaction Social Interaction: 2-Interacts appropriately 25 - 49% of time - Needs frequent redirection. Problem Solving Problem Solving: 1-Solves basic less than 25% of the time - needs direction nearly all the time or does not effectively solve problems and may need a restraint for safety Memory Memory: 1-Recognizes or recalls less than 25% of the time/requires cueing greater than 75% of the time FIM - Eating Eating Activity: 5: Set-up assist for open containers;5: Supervision/cues  Pain Pain Assessment Pain Assessment: Pain Score: 6 in left lower abdomian, RN made aware and medications given  Therapy/Group: Individual Therapy  Monee Dembeck 05/07/2014, 1:04 PM

## 2014-05-07 NOTE — Progress Notes (Signed)
Physical Therapy Session Note  Patient Details  Name: Jose Krueger MRN: [id number] Date of Birth: [birth year]  Today's Date: 05/07/2014 PT Individual Time: 1100-1200 PT Individual Time Calculation (min): 60 min   Short Term Goals: Week 1:  PT Short Term Goal 1 (Week 1): Patient will follow one step commands with 75% accuracy. PT Short Term Goal 2 (Week 1): Patient will demonstrate sustained attention x10" in controlled environment with max cues. PT Short Term Goal 3 (Week 1): Patient will perform functional transfers with modA x1. PT Short Term Goal 4 (Week 1): Patient will perform bed mobility from flat bed with supervision. PT Short Term Goal 5 (Week 1): Patient will ambulate 35' with LRAD and modA x1.  Skilled Therapeutic Interventions/Progress Updates:    First Session: Patient received sitting in wheelchair at RN station. Session focused on initiation, sustained attention, command following, and cognitive remediation with functional mobility and therapeutic activities. Patient performs functional ambulation 44' x2, 80' x1, 100' x1, and 175' x1 with R HHA and minA, cues for upright posture and longer step/stride lengths. Stair negotiation x6 stairs with one handrails and minA, min cues for sustained attention to task. Furniture transfers in ADL apartment with [place]. Pipe tree activity in standing with close supervision to intermittent minA, 2 diagrams (cross and field goal), patient initially requires max cues for proper sequencing then progressing to min cues for sequencing and accuracy.   Patient continues with language of confusion and disoriented to place, time, and situation. Patient with improved compliance with NWB precautions with L UE, likely due to pain with weight bearing; patient continues to requires mod cues to maintain precautions, however. Patient left sitting in wheelchair at RN station with seatbelt donned.  Therapy Documentation Precautions:  Precautions Precautions:  Fall Precaution Comments: left scapula and left clavicle fractures Required Braces or Orthoses: Sling Restrictions Weight Bearing Restrictions: Yes LUE Weight Bearing: Non weight bearing Other Position/Activity Restrictions: per ortho notes: AROM allowed for LT UE Pain: Pain Assessment Pain Assessment: No/denies pain Pain Score: 6  Locomotion : Ambulation Ambulation/Gait Assistance: 4: Min assist   See FIM for current functional status  Therapy/Group: Individual Therapy  Lillia Abed. Tyson Masin, PT, DPT 05/07/2014, 12:13 PM
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
## 2014-05-07 NOTE — Progress Notes (Signed)
Patient refused all HS medications. Patient was agitated and verbally abusive to staff. Patient slept 6 hours. Patients mood improved this AM. Nurse able to give Ritlan at 0700. adm

## 2014-05-07 NOTE — Plan of Care (Signed)
Problem: RH PAIN MANAGEMENT Goal: RH STG PAIN MANAGED AT OR BELOW PT'S PAIN GOAL Less than 2 / scale 1-10  Outcome: Not Progressing Reports pain as 6

## 2014-05-07 NOTE — Progress Notes (Signed)
Physical Therapy Session Note  Patient Details  Name: Jose Krueger MRN: [id number] Date of Birth: 06-16-1956  Today's Date: 05/07/2014 PT Individual Time: 1300-1400 PT Individual Time Calculation (min): 60 min   Short Term Goals: Week 1:  PT Short Term Goal 1 (Week 1): Patient will follow one step commands with 75% accuracy. PT Short Term Goal 2 (Week 1): Patient will demonstrate sustained attention x10" in controlled environment with max cues. PT Short Term Goal 3 (Week 1): Patient will perform functional transfers with modA x1. PT Short Term Goal 4 (Week 1): Patient will perform bed mobility from flat bed with supervision. PT Short Term Goal 5 (Week 1): Patient will ambulate 71' with LRAD and modA x1.  Skilled Therapeutic Interventions/Progress Updates:    Pt received seated at nurse's station, agreeable to participate in therapy. Session focused on functional ambulation, sustained attention, awareness. Pt transported to rehab gym via w/c. Pt able to engage in Connect Four game w/ min cueing after initial explanation of rules. Pt initially able to engage in game at standing level before needing to sit due to back pain. Pt w/ delayed processing and impaired problem solving, unable to see winning moves in game and play checkers in right place to win. Rehab gym began filling up and becoming louder, so moved to ADL apartment for fewer distractions. Pt continued to have trouble concentrating and began becoming agitated. Pt ambulated 150' to [place] w/ min Guard and quieter environment. During orientation questions in [place] pt unable to answer last name or state that he was currently in, though suspect some of this was behavioral rather than true disorientation. Attempted to engaged pt in game of Tic Tac Toe, but pt said he would rather play "Dot the Dot." When asked to show therapist how to play, pt stood and used marker to draw dots within tic tac toe grid. Pt transported back to nurse's station via  w/c, left seated in w/c w/ QRB on w/ all needs within reach.  Therapy Documentation Precautions:  Precautions Precautions: Fall Precaution Comments: left scapula and left clavicle fractures Required Braces or Orthoses: Sling Restrictions Weight Bearing Restrictions: Yes LUE Weight Bearing: Non weight bearing Other Position/Activity Restrictions: per ortho notes: AROM allowed for LT UE General:   Vital Signs: Therapy Vitals Temp: 98.6 F (37 C) Temp src: Oral Pulse Rate: 78 Resp: 18 BP: 119/64 mmHg Patient Position (if appropriate): Lying Oxygen Therapy SpO2: 98 % O2 Device: None (Room air) Pain:   Mobility:   Locomotion :    Trunk/Postural Assessment :    Balance:   Exercises:   Other Treatments:    See FIM for current functional status  Therapy/Group: Individual Therapy  Rada Hay Rada Hay, PT, DPT 05/07/2014, 7:50 AM
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
## 2014-05-08 ENCOUNTER — Inpatient Hospital Stay (HOSPITAL_COMMUNITY): Payer: Medicaid Other | Admitting: *Deleted

## 2014-05-08 ENCOUNTER — Inpatient Hospital Stay (HOSPITAL_COMMUNITY): Payer: Self-pay | Admitting: Speech Pathology

## 2014-05-08 ENCOUNTER — Encounter (HOSPITAL_COMMUNITY): Payer: Self-pay

## 2014-05-08 ENCOUNTER — Inpatient Hospital Stay (HOSPITAL_COMMUNITY): Payer: Self-pay | Admitting: Physical Therapy

## 2014-05-08 ENCOUNTER — Inpatient Hospital Stay (HOSPITAL_COMMUNITY): Payer: Medicaid Other | Admitting: Occupational Therapy

## 2014-05-08 MED ORDER — LACTULOSE 10 GM/15ML PO SOLN
45.0000 g | Freq: Three times a day (TID) | ORAL | Status: DC
  Administered 2014-05-08 – 2014-05-17 (×26): 45 g via ORAL
  Filled 2014-05-08 (×31): qty 90
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
## 2014-05-08 NOTE — Progress Notes (Signed)
Occupational Therapy Weekly Progress Note  Patient Details  Name: Jose Krueger MRN: [id number] Date of Birth: [birth year]  Beginning of progress report period: May 01, 2014 End of progress report period: May 08, 2014  Today's Date: 05/08/2014 OT Individual Time: 4401-0272 OT Individual Time Calculation (min): 53 min    Patient has met 2 of 4 short term goals.  Patient has made some progress during therapy this week. Currently, patient requires min-mod HHA for functional transfers and functional mobility in room. Patient requires max cues for safety awareness during mobility. Patient requires max-total assist for initiation of self-care tasks and max cues for attention. Patient's greatest barriers at this time are decreased attention, pain, and overall impaired cognition. Patient requires max cue for adherence to NWB precaution, however continues to WB. Patient demonstrating behaviors consistent with Rancho Level V.  Patient continues to demonstrate the following deficits: decreased safety awareness, decreased attention, decreased strength, decreased coordination, decreased balance strategies, decreased awareness, decreased activity tolerance, decreased standing balance, overall impaired cognition and therefore will continue to benefit from skilled OT intervention to enhance overall performance with BADLs, safety awareness, ability to compensate for deficits, attention, and balance.  Patient progressing toward long term goals..  Continue plan of care.  OT Short Term Goals Week 1:  OT Short Term Goal 1 (Week 1): Patient will demonstrate focused attention to self-care task for 10 seconds OT Short Term Goal 1 - Progress (Week 1): Met OT Short Term Goal 2 (Week 1): Patient will wash 5/10 body parts with mod cues   OT Short Term Goal 2 - Progress (Week 1): Not met OT Short Term Goal 3 (Week 1): Patient will follow 1 step commands 50% of time during functional activity  OT Short Term Goal  3 - Progress (Week 1): Not met OT Short Term Goal 4 (Week 1): Patient will complete functional transfers with max assist  OT Short Term Goal 4 - Progress (Week 1): Met Week 2:  OT Short Term Goal 1 (Week 2): Pt will follow 1 step commands 50% of time with max multimodal cues OT Short Term Goal 2 (Week 2): Pt will consistently demonstrate sustained attention to functional activity 1 min  OT Short Term Goal 3 (Week 2): Pt will be oriented x3 with max multimodal cues OT Short Term Goal 4 (Week 2): Pt with stand during self-care task at supervision level for 30 seconds  Skilled Therapeutic Interventions/Progress Updates:   Pt seen for ADL retraining with focus on initiation, activity tolerance, focused/sustained attention, bed mobility, problem solving, and functional transfers. Pt received supine in bed initially declining therapy. Pt verbally perseverating stating "shit" over and over and unable to redirect. Pt would not open eyes despite max multimodal cues. Provided pt with 15 min "break" and upon return pt participating with increased time and max cues. Pt oriented to to person only. Completed dressing while supine in bed as pt refused to sit EOB. Required mod cues to initiate and complete dressing. Pt willing to sit EOB for breakfast. Required more then reasonable amount of time (~8 min) to complete supine>sit. Pt declining verbal and physical assist with bed mobilty as increasing frustration and raising voice at therapist to "get away." Completed self-feeding sitting EOB with pt sustaining attention to task for 10 min with mod cues. Pt required max multimodal cues for correct use of utensils as he attempted to lick plate while holding spoon to side. Upon completion, pt requesting to use toilet. Ambulated with min HHA to  bathroom. Pt managed clothing down with min multimodal cues. Pt incontinent of bowel and bladder. Pt left sitting on toilet with NT present.  Pt continues to require max cues for NWB  precautions to LUE, however minimal carryover note.   Therapy Documentation Precautions:  Precautions Precautions: Fall Precaution Comments: left scapula and left clavicle fractures Required Braces or Orthoses: Sling Restrictions Weight Bearing Restrictions: Yes LUE Weight Bearing: Non weight bearing Other Position/Activity Restrictions: per ortho notes: AROM allowed for LT UE General:   Vital Signs: Therapy Vitals Temp: 98.5 F (36.9 C) Temp src: Oral Pulse Rate: 72 Resp: 20 BP: 130/65 mmHg Patient Position (if appropriate): Lying Oxygen Therapy SpO2: 96 % O2 Device: None (Room air) Pain:    See FIM for current functional status  Therapy/Group: Individual Therapy  Duayne Cal 05/08/2014, 7:25 AM

## 2014-05-08 NOTE — Progress Notes (Signed)
Physical Therapy Session Note  Patient Details  Name: Jose Krueger MRN: [id number] Date of Birth: 09-25-1955  Today's Date: 05/08/2014 PT Individual Time: 1500-1540 PT Individual Time Calculation (min): 40 min   Short Term Goals: Week 2:  PT Short Term Goal 1 (Week 2): STGs=LTGs  Skilled Therapeutic Interventions/Progress Updates:   1:1. Pt received seated in w/c at nurses' station. Pt very lethargic but answered "yes" when asked if he'd like to get up from w/c and move around. Unsuccessful in attempt engage pt in game of Connect 4 for sustained attention, sequencing. Pt noted to be shivering, bent over in w/c c/o pain in lower back. Therefore transported pt to room to assess vitals. Noted temperature of 99.3 degrees. RN notified, present soon thereafter to manually reassess low BP reading. See below for all vitals. Due to decreased arousal while seated in w/c, performed stand pivot transfer from w/c>recliner with +2A for safety, max coaxing. Pt left asleep in recliner with bilat LE's elevated and quick release belt in place for safety.   Pt missing 20 minutes of skilled physical therapy secondary to fatigue, pain. RN aware.  Therapy Documentation Precautions:  Precautions Precautions: Fall Precaution Comments: left scapula and left clavicle fractures Required Braces or Orthoses: Sling Restrictions Weight Bearing Restrictions: Yes LUE Weight Bearing: Non weight bearing Other Position/Activity Restrictions: per ortho notes: AROM allowed for LT UE General: PT Amount of Missed Time (min): 20 Minutes PT Missed Treatment Reason: Pain;Patient fatigue Vital Signs: Therapy Vitals Temp: 99.3 F (37.4 C) Temp src: Oral Pulse Rate: 89 Resp: 20 BP: 130/82 mmHg Patient Position (if appropriate): Sitting Oxygen Therapy SpO2: 99 % O2 Device: None (Room air) Pain: Pain Assessment Pain Assessment: FLACC Pain Score: 7  Pain Type: Acute pain Pain Location: Back Pain Orientation:  Lower Pain Descriptors / Indicators: Discomfort;Moaning Pain Intervention(s): RN made aware;Repositioned Multiple Pain Sites: No  See FIM for current functional status  Therapy/Group: Individual Therapy  Hobble, Malva Cogan 05/08/2014, 3:49 PM

## 2014-05-08 NOTE — Progress Notes (Signed)
Physical Therapy Weekly Progress Note  Patient Details  Name: Jose Krueger MRN: [id number] Date of Birth: [birth year]  Beginning of progress report period: May 01, 2014 End of progress report period: May 08, 2014  Today's Date: 05/08/2014 PT Individual Time: 0900-0926 PT Individual Time Calculation (min): 26 min   Patient has met 5 of 5 short term goals. Patient has made good progress during first reporting period on rehab, however, continues to demonstrate fluctuations in alertness/arousal, assistance required, and behavior. Patient can require as little as minA for all functional mobility (gait, stair negotiation, transfers, and bed mobility) or as much as maxA and totalA for cues. Patient with improved behavior as he is no longer yelling out, however, patient does demonstrate intermittent episodes of non-aggressive agitation, yelling/cursing at staff members. Patient is currently demonstrating behaviors consistent with Rancho Level V.  Patient continues to demonstrate the following deficits: poor activity tolerance, cognitive deficits, decreased balance and balance strategies, decreased postural control (pain likely a contribution), decreased ability to compensate for deficits, decreased functional use of L UE, decreased attention, decreased awareness, poor safety awareness, decreased coordination, decreased knowledge of and adherence to weight bearing precautions, and therefore will continue to benefit from skilled PT intervention to enhance overall performance with activity tolerance, balance, postural control, ability to compensate for deficits, functional use of  left upper extremity, attention, awareness, coordination and knowledge of precautions.  Patient progressing toward long term goals..  Continue plan of care.  PT Short Term Goals Week 1:  PT Short Term Goal 1 (Week 1): Patient will follow one step commands with 75% accuracy. PT Short Term Goal 1 - Progress (Week 1):  Met PT Short Term Goal 2 (Week 1): Patient will demonstrate sustained attention x10" in controlled environment with max cues. PT Short Term Goal 2 - Progress (Week 1): Met PT Short Term Goal 3 (Week 1): Patient will perform functional transfers with modA x1. PT Short Term Goal 3 - Progress (Week 1): Met PT Short Term Goal 4 (Week 1): Patient will perform bed mobility from flat bed with supervision. PT Short Term Goal 4 - Progress (Week 1):  (inconsistently) PT Short Term Goal 5 (Week 1): Patient will ambulate 48' with LRAD and modA x1. PT Short Term Goal 5 - Progress (Week 1): Met Week 2:  PT Short Term Goal 1 (Week 2): STGs=LTGs  Skilled Therapeutic Interventions/Progress Updates:    Patient received sitting in wheelchair at RN station. Session focused on increasing alertness/arousal with functional mobility. Patient maintaining eyes closed and nonverbal despite max mulitmodal cues attempting to increase alertness. Patient finally responding to loud voice and tapping. Patient requires max cues for sit>stand, then able to perform functional ambulation 59' x1 with R HHA and minA, very short step/stride length and choppy gait pattern. Patient continues to close eyes while ambulating. Patient with decreased ability to verbalize needs/pain. Patient brought to ADL apartment and performed one furniture transfer to couch with [place]. Patient continues to require min cues to adhere to weight bearing precautions for L UE, but has shown improvement. Patient continues with eyes closed, rocking back and forth, and decreased verbalizations even when asked about pain. Patient eventually returned to room and requires minA for transfer wheelchair>bed and modA for sit>supine. Patient left supine in enclosure bed; RN notified.  Therapy Documentation Precautions:  Precautions Precautions: Fall Precaution Comments: left scapula and left clavicle fractures Required Braces or Orthoses: Sling Restrictions Weight Bearing  Restrictions: Yes LUE Weight Bearing: Non weight bearing Other Position/Activity  Restrictions: per ortho notes: AROM allowed for LT UE General: PT Amount of Missed Time (min): 34 Minutes PT Missed Treatment Reason: Pain;Patient fatigue Pain: PAINAD (Pain Assessment in Advanced Dementia) Negative Vocalization: occasional moan/groan, low speech, negative/disapproving quality Facial Expression: facial grimacing Body Language: tense, distressed pacing, fidgeting Consolability: unable to console, distract or reassure Locomotion : Ambulation Ambulation/Gait Assistance: 4: Min assist   See FIM for current functional status  Therapy/Group: Individual Therapy  Lillia Abed. Justyn Boyson, PT, DPT 05/08/2014, 9:47 AM

## 2014-05-08 NOTE — Progress Notes (Signed)
Occupational Therapy Session Note  Patient Details  Name: Jose Krueger MRN: [id number] Date of Birth: 1956/04/27  Today's Date: 05/08/2014 OT Individual Time: 1400-1500 OT Individual Time Calculation (min): 60 min   Short Term Goals: Week 2:  OT Short Term Goal 1 (Week 2): Pt will follow 1 step commands 50% of time with max multimodal cues OT Short Term Goal 2 (Week 2): Pt will consistently demonstrate sustained attention to functional activity 1 min  OT Short Term Goal 3 (Week 2): Pt will be oriented x3 with max multimodal cues OT Short Term Goal 4 (Week 2): Pt with stand during self-care task at supervision level for 30 seconds  Skilled Therapeutic Interventions/Progress Updates:  Patient resting in w/c at nursing station upon arrival.  Engaged in cognitive table top task, toileting and toilet transfer.  Patient lethargic today and required significant amount of encouragement to follow directions, attend to tasks and to participate this afternoon as compared to last time this OT worked with him 2 days ago.  Mod vcs to open eyes during session yet would occasionally talk with eyes closed.  Patient assisted to stand 2 times while attending to table top task yet only stood for ~1 min and again for ~20 seconds.  Patient stood without warning a 3rd time moved his diaper out of the way and proceeded to urinate on the w/c cushion and the carpet in the day room.  Assisted patient back to his room and ambulated from sink to toilet secondary to he said the needed to toilet yet without results.  Assisted patient to clean up and donn clean diaper and gripper socks.  Patient placed at nursing station per RN request.  Therapy Documentation Precautions:  Precautions Precautions: Fall Precaution Comments: left scapula and left clavicle fractures Required Braces or Orthoses: Sling Restrictions Weight Bearing Restrictions: Yes LUE Weight Bearing: Non weight bearing Other Position/Activity Restrictions:  per ortho notes: AROM allowed for LT UE Pain: Pain in back and left side, not rated, RN aware and medication provided See FIM for current functional status  Therapy/Group: Individual Therapy  Halia Franey, Eugenio Saenz 05/08/2014, 5:51 PM
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
## 2014-05-08 NOTE — Progress Notes (Signed)
Note and chart reviewed. Pryor Ochoa RD, LDN Inpatient Clinical Dietitian Pager: [phone] After Hours Pager: [phone]

## 2014-05-08 NOTE — Progress Notes (Signed)
Agree.  Altamese [place], MD [facility] [phone] (p)

## 2014-05-08 NOTE — Progress Notes (Signed)
SLP Cancellation Note  Patient Details Name: YSMAEL HIRES MRN: [id number] DOB: 07-Sep-1955   Cancelled treatment:        Patient missed 30 minutes of skilled SLP therapy due to refusing to participate buy placing covers over his head and rolling away from clinician; patient appeared fatigued.                                                                                              Gunnar Fusi, M.A., CCC-SLP [phone]  Amanda Park 05/08/2014, 11:52 AM

## 2014-05-09 ENCOUNTER — Inpatient Hospital Stay (HOSPITAL_COMMUNITY): Payer: Medicaid Other | Admitting: *Deleted

## 2014-05-09 ENCOUNTER — Encounter (HOSPITAL_COMMUNITY): Payer: Self-pay

## 2014-05-09 ENCOUNTER — Inpatient Hospital Stay (HOSPITAL_COMMUNITY): Payer: Medicaid Other | Admitting: Speech Pathology

## 2014-05-09 ENCOUNTER — Inpatient Hospital Stay (HOSPITAL_COMMUNITY): Payer: Medicaid Other

## 2014-05-09 LAB — URINALYSIS, ROUTINE W REFLEX MICROSCOPIC
BILIRUBIN URINE: NEGATIVE
Glucose, UA: NEGATIVE mg/dL
HGB URINE DIPSTICK: NEGATIVE
KETONES UR: NEGATIVE mg/dL
Leukocytes, UA: NEGATIVE
Nitrite: NEGATIVE
PH: 7.5 (ref 5.0–8.0)
Protein, ur: NEGATIVE mg/dL
Specific Gravity, Urine: 1.021 (ref 1.005–1.030)
Urobilinogen, UA: 4 mg/dL — ABNORMAL HIGH (ref 0.0–1.0)

## 2014-05-09 LAB — URINE MICROSCOPIC-ADD ON

## 2014-05-09 MED ORDER — METHYLPHENIDATE HCL 5 MG PO TABS
10.0000 mg | ORAL_TABLET | Freq: Two times a day (BID) | ORAL | Status: DC
  Administered 2014-05-09 – 2014-05-23 (×26): 10 mg via ORAL
  Filled 2014-05-09 (×28): qty 2

## 2014-05-09 MED ORDER — QUETIAPINE FUMARATE 25 MG PO TABS
25.0000 mg | ORAL_TABLET | Freq: Every day | ORAL | Status: DC
  Filled 2014-05-09: qty 1

## 2014-05-09 MED ORDER — QUETIAPINE FUMARATE 25 MG PO TABS
25.0000 mg | ORAL_TABLET | Freq: Every day | ORAL | Status: DC
  Administered 2014-05-09 – 2014-05-23 (×14): 25 mg via ORAL
  Filled 2014-05-09 (×19): qty 1

## 2014-05-09 MED ORDER — QUETIAPINE FUMARATE 50 MG PO TABS
75.0000 mg | ORAL_TABLET | Freq: Every day | ORAL | Status: DC
  Administered 2014-05-09 – 2014-05-22 (×14): 75 mg via ORAL
  Filled 2014-05-09 (×16): qty 1

## 2014-05-09 NOTE — Progress Notes (Signed)
Physical Therapy Session Note  Patient Details  Name: Jose Krueger MRN: [id number] Date of Birth: 01/28/1956  Today's Date: 05/09/2014 PT Individual Time: 1400-1415 PT Individual Time Calculation (min): 15 min  and Today's Date: 05/09/2014 PT Missed Time: 48 Minutes Missed Time Reason: Patient unwilling to participate  Short Term Goals: Week 2:  PT Short Term Goal 1 (Week 2): STGs=LTGs  Skilled Therapeutic Interventions/Progress Updates:    Pt received seated in recliner at nurse's station. Pt unable to be aroused w/ verbal cues, but able to be aroused w/ sternal rub. Attempted to have pt stand up and walk from recliner, pt continually keeping eyes closed and unable to respond to questions from therapist. Pt perseverating on "throwing the ball down the hill", likely from PT session this AM. Pt continually refused to get out of recliner or open eyes w/ therapist, unable to get pt to agree to participate despite max encouragement. Pt left in recliner w/ QRB on at nurse's station. Pt missed 45 minutes of PT.  Therapy Documentation Precautions:  Precautions Precautions: Fall Precaution Comments: left scapula and left clavicle fractures Required Braces or Orthoses: Sling Restrictions Weight Bearing Restrictions: Yes LUE Weight Bearing: Non weight bearing Other Position/Activity Restrictions: per ortho notes: AROM allowed for LT UE General: PT Amount of Missed Time (min): 45 Minutes PT Missed Treatment Reason: Patient unwilling to participate Vital Signs: Therapy Vitals Temp: 98.4 F (36.9 C) Temp src: Oral Pulse Rate: 80 Resp: 18 BP: 111/74 mmHg Patient Position (if appropriate): Sitting Oxygen Therapy SpO2: 98 % O2 Device: None (Room air) Pain:   Mobility:   Locomotion :    Trunk/Postural Assessment :    Balance:   Exercises:   Other Treatments:    See FIM for current functional status  Therapy/Group: Individual Therapy  Rada Hay Rada Hay, PT,  DPT 05/09/2014, 3:27 PM

## 2014-05-09 NOTE — Progress Notes (Signed)
[place] PHYSICAL MEDICINE & REHABILITATION     PROGRESS NOTE    Subjective/Complaints: Restless at times. In [place] this [place]/calm and quiet.  No new issues overnight.   Objective: Vital Signs: Blood pressure 130/70, pulse 87, temperature 98.3 F (36.8 C), temperature source Oral, resp. rate 19, weight 89.2 kg (196 lb 10.4 oz), SpO2 90.00%. No results found. No results found for this basename: WBC, HGB, HCT, PLT,  in the last 72 hours No results found for this basename: NA, K, CL, CO, GLUCOSE, BUN, CREATININE, CALCIUM,  in the last 72 hours CBG (last 3)  No results found for this basename: GLUCAP,  in the last 72 hours  Wt Readings from Last 3 Encounters:  05/03/14 89.2 kg (196 lb 10.4 oz)  04/30/14 83.416 kg (183 lb 14.4 oz)  04/30/14 83.416 kg (183 lb 14.4 oz)    Physical Exam:  Nursing note and vitals reviewed.  Constitutional: He appears well-developed and well-nourished. Cervical collar and nasal cannula in place.  Needed sternal rubs to awaken briefly. Listing to the right in bed and resisting attempts at repositioning. BUE in mittens. Moaning occasionally.  HENT:  Head: Normocephalic.  Neck:  Supple. Central line in place right neck. Left clavicle incision with steri strips in place (no sling)  Cardiovascular: Regular rhythm.  Respiratory: Tachypnea noted. He has rhonchi.  Wet upper airway sounds. ronchi with occasional wheeze  GI: Soft. Bowel sounds are normal. He exhibits no distension.  Musculoskeletal: He exhibits no edema.  LUE with  abraded area from shoulder to mid forearm with some scabbing. Bilateral patella with large abraded areas with loss of epidermal tissue with pink underlying tissue. Left with large   scab. Two additional abrasion on left lateral leg.  Neurological:  Remains   Confused. Limited eye contact. Does not recognize name with choice of two. Won'Krueger participate in examination. moves all 4's     Assessment/Plan: 1. Functional deficits  secondary to TBI, polytrauma which require 3+ hours per day of interdisciplinary therapy in a comprehensive inpatient rehab setting. Physiatrist is providing close team supervision and 24 hour management of active medical problems listed below. Physiatrist and rehab team continue to assess barriers to discharge/monitor patient progress toward functional and medical goals. FIM: FIM - Bathing Bathing Steps Patient Completed: Chest;Abdomen Bathing: 1: Total-Patient completes 0-2 of 10 parts or less than 25%  FIM - Upper Body Dressing/Undressing Upper body dressing/undressing steps patient completed: Thread/unthread right sleeve of pullover shirt/dresss;Thread/unthread left sleeve of pullover shirt/dress;Put head through opening of pull over shirt/dress;Pull shirt over trunk Upper body dressing/undressing: 5: Set-up assist to: Obtain clothing/put away FIM - Lower Body Dressing/Undressing Lower body dressing/undressing steps patient completed: Thread/unthread right pants leg;Thread/unthread left pants leg Lower body dressing/undressing: 4: Min-Patient completed 75 plus % of tasks  FIM - Toileting Toileting steps completed by patient: Adjust clothing prior to toileting;Performs perineal hygiene Toileting Assistive Devices: Grab bar or rail for support Toileting: 1: Total-Patient completed zero steps, helper did all 3  FIM - Radio producer Devices: Grab bars Toilet Transfers: 3-To toilet/BSC: Mod A (lift or lower assist);4-From toilet/BSC: Min A (steadying Pt. > 75%)  FIM - Bed/Chair Transfer Bed/Chair Transfer Assistive Devices: Arm rests Bed/Chair Transfer: 1: Two helpers  FIM - Locomotion: Wheelchair Locomotion: Wheelchair: 1: Total Assistance/staff pushes wheelchair (Pt<25%) FIM - Locomotion: Ambulation Locomotion: Ambulation Assistive Devices: Other (comment) (R HHA) Ambulation/Gait Assistance: 4: Min assist Locomotion: Ambulation: 0: Activity did not  occur  Comprehension Comprehension Mode: Auditory  Comprehension: 1-Understands basic less than 25% of the time/requires cueing 75% of the time  Expression Expression Mode: Verbal Expression: 2-Expresses basic 25 - 49% of the time/requires cueing 50 - 75% of the time. Uses single words/gestures.  Social Interaction Social Interaction: 2-Interacts appropriately 25 - 49% of time - Needs frequent redirection.  Problem Solving Problem Solving: 2-Solves basic 25 - 49% of the time - needs direction more than half the time to initiate, plan or complete simple activities  Memory Memory: 2-Recognizes or recalls 25 - 49% of the time/requires cueing 51 - 75% of the time  Medical Problem List and Plan:  1. Functional deficits secondary to TBI  2. DVT Prophylaxis/Anticoagulation: Mechanical: Antiembolism stockings, thigh (TED hose) Bilateral lower extremities  Sequential compression devices, below knee Bilateral lower extremities  3. Pain Management: Monitor for outward signs of distress and medicate as appropriate.  4. Mood: Not able to determine at this time. H/o anxiety/depression/withdrawal with recommendation for outpatient treatment 02/2014. Will monitor as mentation improves. LCSW to follow along for evaluation/support when appropriate.  5. Neuropsych: This patient is not capable of making decisions on his own behalf.   -[place] bed still required due to safety  -increase ritalin for improved attention and initation  -tegretol for mood stabilization   -hs seroquel--add daytime dosing to assist with agitation 6. Skin/Wound Care: Monitor multiple abrasions on extremities for healing. Pressure relief measures. Nutritional supplement to promote wound healing. Question po intake with fluctuating mental status.  7. Advanced Cirrhosis of liver/Liver mass?: Continue lactulose tid. ammonia levels pending  -MRI 12/2010 with two small suspicious liver lesions with recommendations for follow up at that  time. (Had seen Dr. Benson [place] 2012 [place])  8. Left clavicle fracture: NWB LUE. Sling for support as able.  9. ABLA:    10. Activation: amantadine, added ritalin 11. Vitamin B 12 deficiency   -recheck labs pending  LOS (Days) 9 A FACE TO FACE EVALUATION WAS PERFORMED  Jose Krueger 05/09/2014 8:24 AM

## 2014-05-09 NOTE — Progress Notes (Signed)
Occupational Therapy Note  Patient Details  Name: Jose Krueger MRN: [id number] Date of Birth: 1956-04-05  Following discussion with treatment team, patient's plan of care adjusted to 3.5 hours of scheduled therapy secondary to decreased alertness and active participation. Patient's schedule will return to 4.5 when patient is able to tolerate.   [name] N 05/09/2014, 2:55 PM

## 2014-05-09 NOTE — Progress Notes (Signed)
Occupational Therapy Note  Patient Details  Name: DELANO FRATE MRN: [id number] Date of Birth: May 13, 1956  Today's Date: 05/09/2014 OT Missed Time: 7 Minutes Missed Time Reason: Patient unwilling/refused to participate without medical reason;Patient fatigue  Patient missing 45 min skilled OT secondary to refusal to participate and fatigue. Patient received supine in bed in fetal position with hospital gown over face. Patient initially not responding to therapist. Unzipped posey bed and provided tactile and environmental cues, however, patient speaking in profanity at therapist. Attempted on 2 more occasions, however patient continued to decline participation as would randomly curse and remain with eyes closed.    Shann Lewellyn N 05/09/2014, 8:11 AM

## 2014-05-09 NOTE — Progress Notes (Signed)
Speech Language Pathology Weekly Progress and Session Note  Patient Details  Name: Jose Krueger MRN: [id number] Date of Birth: Oct 08, 1955  Beginning of progress report period: May 01, 2014 End of progress report period: May 09, 2014  Today's Date: 05/09/2014 SLP Individual Time: 1015-1050 SLP Individual Time Calculation (min): 35 min  Short Term Goals: Week 1: SLP Short Term Goal 1 (Week 1): Pt will utilize external aids to orient to place, date, and situation with mod-max assist  SLP Short Term Goal 1 - Progress (Week 1): Not met SLP Short Term Goal 2 (Week 1): Pt will sustain attention to basic tasks for 2-3 minutes with mod-max assist for redirection.  SLP Short Term Goal 2 - Progress (Week 1): Not met SLP Short Term Goal 3 (Week 1): Pt will tolerate his currently prescribed diet with no overt s/s of aspiration and mod assist for use of swallowing precautions.  SLP Short Term Goal 3 - Progress (Week 1): Not met SLP Short Term Goal 4 (Week 1): Pt will use external aids to improve immediate recall of basic, daily information for 75% accuracy with max assist.  SLP Short Term Goal 4 - Progress (Week 1): Not met SLP Short Term Goal 5 (Week 1): Pt will return demonstration of call bell to indicate needs/wants with nursing for 25% of opportunities with max assist.   SLP Short Term Goal 5 - Progress (Week 1): Not met    New Short Term Goals: Week 2: SLP Short Term Goal 1 (Week 2): Patient will demonstrate focused attention to a functioanl task for 30 seconds with Max A multimodal cues.  SLP Short Term Goal 2 (Week 2): Patinet will initate functional tasks with Max A multimodal cues.  SLP Short Term Goal 3 (Week 2): Patient will verbally express basic wants/needs with Max A multimodal cues.  SLP Short Term Goal 4 (Week 2): Pt will consume current diet with Mod A mulitmodal cues for utilziation of swallowing compensatory strateiges to minimal overt s/s of aspiration   Weekly  Progress Updates: Patient has made minimal and inconsistent gains this reporting period due to inconsistent participation and fatigue/pain resulting in patient not meeting any STG's this reporting period. Currently, patient is demonstrating behaviors consistent with a Rancho Level V and requires total-Max A for orientation, focused-sustained attention, initiation of functional tasks, functional problem solving, intellectual awareness and safety awareness. Patient also demonstrates language of confusion and can be verbally inappropriate at times which impacts his overall ability to functionally communicate his wants/needs. Patient is currently consuming Dys. 3 textures with thin liquids via cup with minimal overt s/s of aspiration but requires Max A multimodal cues for use of swallowing compensatory strategies. Patient would benefit from continued skilled SLP intervention to maximize his cognitive-linguistic function and overall functional independence prior to discharge.    Intensity: Minumum of 1-2 x/day, 30 to 90 minutes Frequency: 5 out of 7 days Duration/Length of Stay: 21-24 days  Treatment/Interventions: English as a second language teacher;Dysphagia/aspiration precaution training;Functional tasks;Internal/external aids;Environmental controls;Patient/family education;Cognitive remediation/compensation;Speech/Language facilitation;Therapeutic Activities   Daily Session Skilled Therapeutic Interventions: Skilled treatment session focused on cognitive-linguistic goals. Upon arrival, pt was asleep while sitting upright in his wheelchair and required total A for arousal and maintain his eyes open for ~10 seconds. Patient was taken to his room to engage in self-care tasks such as grooming and dressing tasks and required total A for initiation and participation. Patient initiated standing X 2 with Mod A verbal, visual and tactile cues and threaded his arms through his  shirt with extra time and Mod A verbal cues. Patient  missed remaining 10 minutes of session due to fatigue. Patient left in room with NT. Continue with current plan of care.      FIM:  Comprehension Comprehension Mode: Auditory Comprehension: 1-Understands basic less than 25% of the time/requires cueing 75% of the time Expression Expression Mode: Verbal Expression: 1-Expresses basis less than 25% of the time/requires cueing greater than 75% of the time. Social Interaction Social Interaction: 1-Interacts appropriately less than 25% of the time. May be withdrawn or combative. Problem Solving Problem Solving: 1-Solves basic less than 25% of the time - needs direction nearly all the time or does not effectively solve problems and may need a restraint for safety Memory Memory: 1-Recognizes or recalls less than 25% of the time/requires cueing greater than 75% of the time FIM - Eating Eating Activity: 4: Helper occasionally brings food to mouth;6: Modified consistency diet: (comment);5: Needs verbal cues/supervision Pain Unable to rate or locate pain but grimaces and yells with any movement. RN made aware.   Therapy/Group: Individual Therapy  Roswell Ndiaye 05/09/2014, 3:29 PM

## 2014-05-09 NOTE — Progress Notes (Signed)
Physical Therapy Session Note  Patient Details  Name: Jose Krueger MRN: [id number] Date of Birth: 1956-06-08  Today's Date: 05/09/2014 PT Individual Time: 0900-1000 and 1526-1535 PT Individual Time Calculation (min): 60 min and 9 min   Short Term Goals: Week 2:  PT Short Term Goal 1 (Week 2): STGs=LTGs  Skilled Therapeutic Interventions/Progress Updates:    AM Session: Patient received supine in enclosure bed. After 10 min of max multimodal cues and encouragement and patient without eye opening, but with verbal responses (cursing, "leave me alone"), +2 for supine>sit and stand pivot transfer bed>wheelchair. Patient maintains eyes closed until transported to the day room and breakfast placed in front of him. Remainder of session focused on initiation, sustained attention to task, command following, and verbal expression of wants/needs.  In day room, patient requires mod cues during self-feeding for sustained attention to task (cues to maintain eyes open, initiate swallow, redirection to eating when patient becomes distracted by others in environment). In gym, patient participated in ball toss sitting in wheelchair to facilitate eyes open, visual scanning to find ball prior to toss. Therapist seated on moving stool to facilitate patient's continued attention to task and visual scanning for target. Patient left sitting in wheelchair with seatbelt donned at RN station.  PM Session: Patient received sitting in recliner at RN station, eyes closed, but vocalizing/moaning/groaning. With multimodal cues and encouragement x2 people, patient agreeable to ambulate to room, performs sit>stand with minA. Functional ambulation x80' with R HHA and minA, patient with intermittent eye closing, requires max cues for navigation. Attempted to have patient use bathroom, but unwilling and transferred straight to bed. Patient left supine in bed with all needs within reach and enclosure bed secured.  Therapy  Documentation Precautions:  Precautions Precautions: Fall Precaution Comments: left scapula and left clavicle fractures Required Braces or Orthoses: Sling Restrictions Weight Bearing Restrictions: Yes LUE Weight Bearing: Non weight bearing Other Position/Activity Restrictions: per ortho notes: AROM allowed for LT UE General: PT Amount of Missed Time (min): 51 Minutes PT Missed Treatment Reason: Patient fatigue;Patient unwilling to participate;Increased agitation Pain: Pain Assessment Pain Assessment: Faces Faces Pain Scale: Hurts even more PAINAD (Pain Assessment in Advanced Dementia) Breathing: normal Negative Vocalization: occasional moan/groan, low speech, negative/disapproving quality Facial Expression: facial grimacing Body Language: tense, distressed pacing, fidgeting Consolability: distracted or reassured by voice/touch PAINAD Score: 5 Locomotion : Ambulation Ambulation/Gait Assistance: Not tested (comment)   See FIM for current functional status  Therapy/Group: Individual Therapy  Jose Krueger, PT, DPT 05/09/2014, 3:52 PM

## 2014-05-09 NOTE — Progress Notes (Signed)
Patient has been extremely drowsy today.  Unable to keep patient focused for any amount of time, therefore has missed multiple therapies today.  Patient will awaken to eat meals, but quickly falls back to sleep.  Will continue to monitor.  Brita Romp, RN

## 2014-05-10 ENCOUNTER — Inpatient Hospital Stay (HOSPITAL_COMMUNITY): Payer: Medicaid Other

## 2014-05-10 ENCOUNTER — Inpatient Hospital Stay (HOSPITAL_COMMUNITY): Payer: Self-pay

## 2014-05-10 LAB — CARBAMAZEPINE LEVEL, TOTAL: Carbamazepine Lvl: 10.9 ug/mL (ref 4.0–12.0)

## 2014-05-10 MED ORDER — CEFUROXIME AXETIL 250 MG PO TABS: 250.0000 mg | ORAL_TABLET | Freq: Two times a day (BID) | ORAL | Status: DC

## 2014-05-10 MED ORDER — CEFIXIME 400 MG PO TABS
400.0000 mg | ORAL_TABLET | Freq: Every day | ORAL | Status: DC
  Filled 2014-05-10 (×2): qty 1

## 2014-05-10 MED ORDER — LEVOFLOXACIN 250 MG PO TABS
250.0000 mg | ORAL_TABLET | Freq: Every day | ORAL | Status: AC
  Administered 2014-05-10 – 2014-05-16 (×6): 250 mg via ORAL
  Filled 2014-05-10 (×9): qty 1
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
## 2014-05-10 NOTE — Progress Notes (Signed)
Physical Therapy Session Note  Patient Details  Name: Jose Krueger MRN: [id number] Date of Birth: [birth year]  Today's Date: 05/10/2014 PT Individual Time: 0900-0950 PT Individual Time Calculation (min): 50 min   Short Term Goals: Week 2:  PT Short Term Goal 1 (Week 2): STGs=LTGs  Skilled Therapeutic Interventions/Progress Updates:  1:1. Pt received sitting in recliner at nurses station, ready for therapy. Focus this session on activity tolerance, active participation, sustained attention, following simple commands and safety during functional mobility. Pt amb 200'x2 and 100'x1 on hard level and carpeted surfaces w/ shopping cart to promote upright posture, increased B step lengths and more fluid gait pattern w/ min A overall. Pt engaged in searching for horseshoes in therapy gym in small area w/ R min HHA and max cues to locate in various quadrants then tossing at target. Pt demonstrating progressive trunk flexion during task, eventually verbalizing pain on L side. RN made aware and present to provide pain medicine. Pt declined further standing activity due to pain, but able to actively attend to 2 rounds of tic-tac-toe w/ mod cues for attention and problem solving. Pt unable to be redirected to third game or ball toss for increased stimulation due to fatigue w/ pt unable to maintain EO despite multimodal cues. Pt missed 46min at end of session due to fatigue. Pt left sitting in recliner at end of session w/ quick release belt in place.   Therapy Documentation Precautions:  Precautions Precautions: Fall Precaution Comments: left scapula and left clavicle fractures Required Braces or Orthoses: Sling Restrictions Weight Bearing Restrictions: Yes LUE Weight Bearing: Non weight bearing Other Position/Activity Restrictions: per ortho notes: AROM allowed for LT UE General: PT Amount of Missed Time (min): 10 Minutes PT Missed Treatment Reason: Patient fatigue Pain: Pain Assessment Pain  Assessment: No/denies pain Pain Score: Asleep Faces Pain Scale: Hurts whole lot Pain Type: Acute pain Pain Location: Arm Pain Orientation: Left Pain Intervention(s): Medication (See eMAR)  See FIM for current functional status  Therapy/Group: Individual Therapy  Gilmore Laroche 05/10/2014, 12:07 PM

## 2014-05-10 NOTE — Progress Notes (Signed)
Speech Language Pathology Daily Session Note  Patient Details  Name: Jose Krueger MRN: [id number] Date of Birth: 13-Nov-1955  Today's Date: 05/10/2014 SLP Individual Time: 1015-1045 SLP Individual Time Calculation (min): 30 min  Short Term Goals: Week 2: SLP Short Term Goal 1 (Week 2): Patient will demonstrate focused attention to a functioanl task for 30 seconds with Max A multimodal cues.  SLP Short Term Goal 2 (Week 2): Patinet will initate functional tasks with Max A multimodal cues.  SLP Short Term Goal 3 (Week 2): Patient will verbally express basic wants/needs with Max A multimodal cues.  SLP Short Term Goal 4 (Week 2): Pt will consume current diet with Mod A mulitmodal cues for utilziation of swallowing compensatory strateiges to minimal overt s/s of aspiration   Skilled Therapeutic Interventions: Skilled treatment session focused on cognitive-linguistic goals. Upon arrival, pt was asleep while supine in the recliner. The patient required total A multimodal cues for arousal and to maintain his eyes being open for ~10 seconds during the first 15 minutes of session, however, by end of session, the patient was able to attend to a basic sorting task for ~10 minutes with Max A multimodal cues. The patient also required Max A multimodal cues for initiation with task and Mod A multimodal cues for problem solving. Patient left in the recliner at the RN station with quick release in place. Continue with current plan of care.    FIM:  Comprehension Comprehension Mode: Auditory Comprehension: 1-Understands basic less than 25% of the time/requires cueing 75% of the time Expression Expression Mode: Verbal Expression: 1-Expresses basis less than 25% of the time/requires cueing greater than 75% of the time. Social Interaction Social Interaction: 1-Interacts appropriately less than 25% of the time. May be withdrawn or combative. Problem Solving Problem Solving: 1-Solves basic less than 25% of  the time - needs direction nearly all the time or does not effectively solve problems and may need a restraint for safety Memory Memory: 1-Recognizes or recalls less than 25% of the time/requires cueing greater than 75% of the time  Pain Pain Assessment Pain Assessment: No/denies pain Pain Score: Asleep Faces Pain Scale: Hurts whole lot Pain Type: Acute pain Pain Location: Arm Pain Orientation: Left Pain Intervention(s): Medication (See eMAR)  Therapy/Group: Individual Therapy  Kent Braunschweig 05/10/2014, 11:57 AM

## 2014-05-10 NOTE — Progress Notes (Signed)
[facility]     PROGRESS NOTE    Subjective/Complaints: No new issues.   No new issues overnight.   Objective: Vital Signs: Blood pressure 116/65, pulse 77, temperature 98 F (36.7 C), temperature source Oral, resp. rate 18, weight 76.34 kg (168 lb 4.8 oz), SpO2 97.00%. No results found. No results found for this basename: WBC, HGB, HCT, PLT,  in the last 72 hours No results found for this basename: NA, K, CL, CO, GLUCOSE, BUN, CREATININE, CALCIUM,  in the last 72 hours CBG (last 3)  No results found for this basename: GLUCAP,  in the last 72 hours  Wt Readings from Last 3 Encounters:  05/09/14 76.34 kg (168 lb 4.8 oz)  04/30/14 83.416 kg (183 lb 14.4 oz)  04/30/14 83.416 kg (183 lb 14.4 oz)    Physical Exam:  Nursing note and vitals reviewed.  Constitutional: He appears well-developed and well-nourished. Cervical collar and nasal cannula in place.  Needed sternal rubs to awaken briefly. Listing to the right in bed and resisting attempts at repositioning. BUE in mittens. Moaning occasionally.  HENT:  Head: Normocephalic.  Neck:  Supple. Central line in place right neck. Left clavicle incision with steri strips in place (no sling)  Cardiovascular: Regular rhythm.  Respiratory: Tachypnea noted. He has rhonchi.  Wet upper airway sounds. ronchi with occasional wheeze  GI: Soft. Bowel sounds are normal. He exhibits no distension.  Musculoskeletal: He exhibits no edema.  LUE with  abraded area from shoulder to mid forearm with some scabbing. Bilateral patella with large abraded areas with loss of epidermal tissue with pink underlying tissue. Left with large   scab. Two additional abrasion on left lateral leg.  Neurological:  Remains   Confused. Limited eye contact. Does not recognize name with choice of two. Won't participate in examination. moves all 4's     Assessment/Plan: 1. Functional deficits secondary to TBI, polytrauma which require  3+ hours per day of interdisciplinary therapy in a comprehensive inpatient rehab setting. Physiatrist is providing close team supervision and 24 hour management of active medical problems listed below. Physiatrist and rehab team continue to assess barriers to discharge/monitor patient progress toward functional and medical goals. FIM: FIM - Bathing Bathing Steps Patient Completed: Chest;Abdomen Bathing: 1: Total-Patient completes 0-2 of 10 parts or less than 25%  FIM - Upper Body Dressing/Undressing Upper body dressing/undressing steps patient completed: Thread/unthread right sleeve of pullover shirt/dresss;Thread/unthread left sleeve of pullover shirt/dress;Put head through opening of pull over shirt/dress;Pull shirt over trunk Upper body dressing/undressing: 5: Set-up assist to: Obtain clothing/put away FIM - Lower Body Dressing/Undressing Lower body dressing/undressing steps patient completed: Thread/unthread right pants leg;Thread/unthread left pants leg Lower body dressing/undressing: 4: Min-Patient completed 75 plus % of tasks  FIM - Toileting Toileting steps completed by patient: Adjust clothing prior to toileting;Performs perineal hygiene Toileting Assistive Devices: Grab bar or rail for support Toileting: 1: Total-Patient completed zero steps, helper did all 3  FIM - Radio producer Devices: Grab bars Toilet Transfers: 3-To toilet/BSC: Mod A (lift or lower assist);4-From toilet/BSC: Min A (steadying Pt. > 75%)  FIM - Bed/Chair Transfer Bed/Chair Transfer Assistive Devices: Arm rests Bed/Chair Transfer: 3: Chair or W/C > Bed: Mod A (lift or lower assist)  FIM - Locomotion: Wheelchair Locomotion: Wheelchair: 0: Activity did not occur FIM - Locomotion: Ambulation Locomotion: Ambulation Assistive Devices: Other (comment) (R HHA) Ambulation/Gait Assistance: 4: Min assist Locomotion: Ambulation: 2: Travels 50 - 149 ft with minimal  assistance  (Pt.>75%)  Comprehension Comprehension Mode: Auditory Comprehension: 1-Understands basic less than 25% of the time/requires cueing 75% of the time  Expression Expression Mode: Verbal Expression: 1-Expresses basis less than 25% of the time/requires cueing greater than 75% of the time.  Social Interaction Social Interaction: 1-Interacts appropriately less than 25% of the time. May be withdrawn or combative.  Problem Solving Problem Solving: 1-Solves basic less than 25% of the time - needs direction nearly all the time or does not effectively solve problems and may need a restraint for safety  Memory Memory: 1-Recognizes or recalls less than 25% of the time/requires cueing greater than 75% of the time  Medical Problem List and Plan:  1. Functional deficits secondary to TBI  2. DVT Prophylaxis/Anticoagulation: Mechanical: Antiembolism stockings, thigh (TED hose) Bilateral lower extremities  Sequential compression devices, below knee Bilateral lower extremities  3. Pain Management: Monitor for outward signs of distress and medicate as appropriate.  4. Mood: Not able to determine at this time. H/o anxiety/depression/withdrawal with recommendation for outpatient treatment 02/2014. Will monitor as mentation improves. LCSW to follow along for evaluation/support when appropriate.  5. Neuropsych: This patient is not capable of making decisions on his own behalf.   -vail bed still required due to safety  -increase ritalin for improved attention and initation  -tegretol for mood stabilization   -hs seroquel--add daytime dosing to assist with agitation 6. Skin/Wound Care: Monitor multiple abrasions on extremities for healing. Pressure relief measures. Nutritional supplement to promote wound healing. Question po intake with fluctuating mental status.  7. Advanced Cirrhosis of liver/Liver mass?: Continue lactulose tid. ammonia levels pending  -MRI 12/2010 with two small suspicious liver lesions with  recommendations for follow up at that time. (Had seen Dr. Benson [place] 2012 [place])  8. Left clavicle fracture: NWB LUE. Sling for support as able.  9. ABLA:    10. Activation: amantadine, added ritalin 11. Vitamin B 12 deficiency   -recheck labs pending  LOS (Days) 10 A FACE TO FACE EVALUATION WAS PERFORMED  SWARTZ,ZACHARY T 05/10/2014 8:50 AM

## 2014-05-10 NOTE — Progress Notes (Signed)
Physical Therapy Note  Patient Details  Name: Jose Krueger MRN: [id number] Date of Birth: 19-May-1956 Today's Date: 05/10/2014   Patient missed 30 minutes PT session. Patient resting in enclosure bed upon entering room. Patient did not feel well during OT session and requested to go to bed. Patient difficult to arouse. Patient did arouse enough to say he was done for the day and would work some more tomorrow. Patient left resting in enclosure bed.    [name] M 05/10/2014, 2:10 PM

## 2014-05-10 NOTE — Progress Notes (Signed)
Called to room by OT.  Patient was throwing up.  Appears to have been partially digested breakfast, large amount.  Administered IM compazine per MD order.  Patient tolerated well.  Will continue to monitor patient closely. Brita Romp, RN

## 2014-05-10 NOTE — Progress Notes (Signed)
Occupational Therapy Session Note  Patient Details  Name: Jose Krueger MRN: [id number] Date of Birth: 1956-01-10  Today's Date: 05/10/2014 OT Individual Time: 1100-1141 and 1300-1335 OT Individual Time Calculation (min): 41 min and 35 min     Short Term Goals: Week 2:  OT Short Term Goal 1 (Week 2): Pt will follow 1 step commands 50% of time with max multimodal cues OT Short Term Goal 2 (Week 2): Pt will consistently demonstrate sustained attention to functional activity 1 min  OT Short Term Goal 3 (Week 2): Pt will be oriented x3 with max multimodal cues OT Short Term Goal 4 (Week 2): Pt with stand during self-care task at supervision level for 30 seconds  Skilled Therapeutic Interventions/Progress Updates:    Session 1: Pt seen for ADL retraining with focus on initiation, sustained attention, functional mobility, and standing balance. Pt received with eyes closed at nurses station. Pt easily aroused in room stating he was "cold." Convinced pt to take "warm shower" with mod cues for initiation for sit>stand to ambulate to bathroom with min HHA. Doffed socks and brief in standing with min assist for balance. Pt initiated washing 4 body parts after presented wash cloth and sustained attention ~30 sec. Pt attempting to get out of shower, however redirected and pt rinsed self and wash peri area with min cues. Completed dressing from recliner chair with max cues for initiation and more then reasonable amount of time. Completed sit<>stand from w/c with therapist preventing weight bearing through LUE. Pt pointing at trash can and signalling to bring it towards him. Presented trash can and pt began vomiting. Notified RN immediately who provided medication. Pt left at nurses station with QRB donned.   Session 2: Pt seen for 1:1 OT session with focus on focused-sustained attention, command following, initiation, and functional transfers. Pt received at nurses station asleep. Pt with increased arousal  when offered lunch. Completed self-feeding in room with max cues for focused-sustained attention. Max cues required for use of utensil to eat. Pt perseverating on going to bed throughout. Informed pt that he needed to inform therapist when he was finished with lunch. Required increased time and max questioning cues for pt to inform completion of meal. Pt much less energetic this AM and appeared sick. Pt reporting feeling "bad" and requesting to go to bed. Pt ambulated approx 8 feet with min HHA recliner>bed. Pt left supine in bed with all needs in reach. Pt missing 25 min d/t fatigue. Pt's RN on break, however, informed NT of pt feeling "bad" and left in posey bed.   Therapy Documentation Precautions:  Precautions Precautions: Fall Precaution Comments: left scapula and left clavicle fractures Required Braces or Orthoses: Sling Restrictions Weight Bearing Restrictions: Yes LUE Weight Bearing: Non weight bearing Other Position/Activity Restrictions: per ortho notes: AROM allowed for LT UE General: General OT Amount of Missed Time: 19 Minutes Vital Signs:   Pain: Pain Assessment Pain Assessment: Faces Pain Score: Asleep Faces Pain Scale: Hurts whole lot Pain Type: Acute pain Pain Location: Arm Pain Orientation: Left Pain Intervention(s): Medication (See eMAR)  See FIM for current functional status  Therapy/Group: Individual Therapy  Duayne Cal 05/10/2014, 11:47 AM

## 2014-05-10 NOTE — Progress Notes (Deleted)
Speech Language Pathology Daily Session Note  Patient Details  Name: Jose Krueger MRN: [id number] Date of Birth: 1956-04-29  Today's Date: 05/10/2014 SLP Individual Time: 1015-1045 SLP Individual Time Calculation (min): 30 min  Short Term Goals: Week 2: SLP Short Term Goal 1 (Week 2): Patient will demonstrate focused attention to a functioanl task for 30 seconds with Max A multimodal cues.  SLP Short Term Goal 2 (Week 2): Patinet will initate functional tasks with Max A multimodal cues.  SLP Short Term Goal 3 (Week 2): Patient will verbally express basic wants/needs with Max A multimodal cues.  SLP Short Term Goal 4 (Week 2): Pt will consume current diet with Mod A mulitmodal cues for utilziation of swallowing compensatory strateiges to minimal overt s/s of aspiration   Skilled Therapeutic Interventions: Skilled treatment session focused on cognitive-linguistic goals. Upon arrival, pt was asleep while supine in the recliner. The patient required total A multimodal cues for arousal and to maintain his eyes being open for ~10 seconds during the first 15 minutes of session, however, by end of session, the patient was able to attend to a basic sorting task for ~10 minutes with Max A multimodal cues. The patient also required Max A multimodal cues for initiation with task and Mod A multimodal cues for problem solving. Patient left in the recliner at the RN station with quick release in place. Continue with current plan of care.    FIM:  Comprehension Comprehension Mode: Auditory Comprehension: 1-Understands basic less than 25% of the time/requires cueing 75% of the time Expression Expression Mode: Verbal Expression: 1-Expresses basis less than 25% of the time/requires cueing greater than 75% of the time. Social Interaction Social Interaction: 1-Interacts appropriately less than 25% of the time. May be withdrawn or combative. Problem Solving Problem Solving: 1-Solves basic less than 25% of  the time - needs direction nearly all the time or does not effectively solve problems and may need a restraint for safety Memory Memory: 1-Recognizes or recalls less than 25% of the time/requires cueing greater than 75% of the time  Pain Pain Assessment Pain Assessment: No/denies pain   Therapy/Group: Individual Therapy  Kanylah Muench 05/10/2014, 11:51 AM

## 2014-05-11 ENCOUNTER — Inpatient Hospital Stay (HOSPITAL_COMMUNITY): Payer: Medicaid Other

## 2014-05-11 ENCOUNTER — Inpatient Hospital Stay (HOSPITAL_COMMUNITY): Payer: Self-pay | Admitting: *Deleted

## 2014-05-11 ENCOUNTER — Inpatient Hospital Stay (HOSPITAL_COMMUNITY): Payer: Medicaid Other | Admitting: *Deleted

## 2014-05-11 ENCOUNTER — Inpatient Hospital Stay (HOSPITAL_COMMUNITY): Payer: Medicaid Other | Admitting: Speech Pathology

## 2014-05-11 DIAGNOSIS — F1010 Alcohol abuse, uncomplicated: Secondary | ICD-10-CM

## 2014-05-11 DIAGNOSIS — S069XAA Unspecified intracranial injury with loss of consciousness status unknown, initial encounter: Secondary | ICD-10-CM

## 2014-05-11 DIAGNOSIS — S2249XA Multiple fractures of ribs, unspecified side, initial encounter for closed fracture: Secondary | ICD-10-CM

## 2014-05-11 DIAGNOSIS — S42009A Fracture of unspecified part of unspecified clavicle, initial encounter for closed fracture: Secondary | ICD-10-CM

## 2014-05-11 DIAGNOSIS — S069X9A Unspecified intracranial injury with loss of consciousness of unspecified duration, initial encounter: Secondary | ICD-10-CM

## 2014-05-11 LAB — URINE CULTURE

## 2014-05-11 NOTE — Progress Notes (Signed)
Speech Language Pathology Daily Session Note  Patient Details  Name: Jose Krueger MRN: [id number] Date of Birth: Dec 10, 1955  Today's Date: 05/11/2014 SLP Individual Time: 1300-1400 SLP Individual Time Calculation (min): 60 min  Short Term Goals: Week 2: SLP Short Term Goal 1 (Week 2): Patient will demonstrate focused attention to a functioanl task for 30 seconds with Max A multimodal cues.  SLP Short Term Goal 2 (Week 2): Patinet will initate functional tasks with Max A multimodal cues.  SLP Short Term Goal 3 (Week 2): Patient will verbally express basic wants/needs with Max A multimodal cues.  SLP Short Term Goal 4 (Week 2): Pt will consume current diet with Mod A mulitmodal cues for utilziation of swallowing compensatory strateiges to minimal overt s/s of aspiration   Skilled Therapeutic Interventions: Skilled treatment session focused on cognitive-linguistic goals. Upon arrival, patient was sitting upright in his wheelchair with his eyes closed and appeared lethargic. SLP facilitated session by initially providing total A multimodal cues for arousal and to maintain his eyes open for ~10 seconds, however, as the session went on with continued multimodal stimulation, the patient became more aroused and was able to participate in session. Patient required Max A multimodal cues for initiation and problem solving with basic money management task of counting money and self-care task of folding clothes from the drier. The patient demonstrated minimal verbal responses throughout the session but overall he demonstrated language of confusion. At the end of the session, the patient ambulated back to his room with assistance and was placed in his recliner with a quick release belt in place.    FIM:  Comprehension Comprehension Mode: Auditory Comprehension: 2-Understands basic 25 - 49% of the time/requires cueing 51 - 75% of the time Expression Expression Mode: Verbal Expression: 1-Expresses basis  less than 25% of the time/requires cueing greater than 75% of the time. Social Interaction Social Interaction: 1-Interacts appropriately less than 25% of the time. May be withdrawn or combative. Problem Solving Problem Solving: 1-Solves basic less than 25% of the time - needs direction nearly all the time or does not effectively solve problems and may need a restraint for safety Memory Memory: 1-Recognizes or recalls less than 25% of the time/requires cueing greater than 75% of the time FIM - Eating Eating Activity: 5: Needs verbal cues/supervision;4: Helper occasionally scoops food on utensil  Pain Pain Assessment Pain Assessment: No/denies pain   Therapy/Group: Individual Therapy  Seanpaul Preece, [place] 05/11/2014, 3:13 PM

## 2014-05-11 NOTE — Progress Notes (Signed)
[facility] PHYSICAL MEDICINE & REHABILITATION     PROGRESS NOTE    Subjective/Complaints: No new problems  No new issues overnight.   Objective: Vital Signs: Blood pressure 133/76, pulse 84, temperature 98.3 F (36.8 C), temperature source Oral, resp. rate 18, weight 76.34 kg (168 lb 4.8 oz), SpO2 98.00%. No results found. No results found for this basename: WBC, HGB, HCT, PLT,  in the last 72 hours No results found for this basename: NA, K, CL, CO, GLUCOSE, BUN, CREATININE, CALCIUM,  in the last 72 hours CBG (last 3)  No results found for this basename: GLUCAP,  in the last 72 hours  Wt Readings from Last 3 Encounters:  05/09/14 76.34 kg (168 lb 4.8 oz)  04/30/14 83.416 kg (183 lb 14.4 oz)  04/30/14 83.416 kg (183 lb 14.4 oz)    Physical Exam:  Nursing note and vitals reviewed.  Constitutional: He appears well-developed and well-nourished. Cervical collar and nasal cannula in place.  Needed sternal rubs to awaken briefly. Listing to the right in bed and resisting attempts at repositioning. BUE in mittens. Moaning occasionally.  HENT:  Head: Normocephalic.  Neck:  Supple. Central line in place right neck. Left clavicle incision with steri strips in place (no sling)  Cardiovascular: Regular rhythm.  Respiratory: Tachypnea noted. He has rhonchi.  Wet upper airway sounds. ronchi with occasional wheeze  GI: Soft. Bowel sounds are normal. He exhibits no distension.  Musculoskeletal: He exhibits no edema.  LUE with  abraded area from shoulder to mid forearm with some scabbing. Bilateral patella with large abraded areas with loss of epidermal tissue with pink underlying tissue. Left with large   scab. Two additional abrasion on left lateral leg.  Neurological:  Remains   Confused. Limited eye contact. Does not recognize name with choice of two. Won't participate in examination. moves all 4's     Assessment/Plan: 1. Functional deficits secondary to TBI, polytrauma which require  3+ hours per day of interdisciplinary therapy in a comprehensive inpatient rehab setting. Physiatrist is providing close team supervision and 24 hour management of active medical problems listed below. Physiatrist and rehab team continue to assess barriers to discharge/monitor patient progress toward functional and medical goals.  FIM: FIM - Bathing Bathing Steps Patient Completed: Chest;Abdomen;Right Arm;Left Arm;Front perineal area Bathing: 3: Mod-Patient completes 5-7 66f 10 parts or 50-74%  FIM - Upper Body Dressing/Undressing Upper body dressing/undressing steps patient completed: Thread/unthread right sleeve of pullover shirt/dresss;Thread/unthread left sleeve of pullover shirt/dress;Put head through opening of pull over shirt/dress;Pull shirt over trunk Upper body dressing/undressing: 5: Set-up assist to: Obtain clothing/put away FIM - Lower Body Dressing/Undressing Lower body dressing/undressing steps patient completed: Pull pants up/down;Thread/unthread right pants leg Lower body dressing/undressing: 1: Total-Patient completed less than 25% of tasks  FIM - Toileting Toileting steps completed by patient: Adjust clothing prior to toileting;Performs perineal hygiene Toileting Assistive Devices: Grab bar or rail for support Toileting: 1: Total-Patient completed zero steps, helper did all 3  FIM - Radio producer Devices: Grab bars Toilet Transfers: 3-To toilet/BSC: Mod A (lift or lower assist);4-From toilet/BSC: Min A (steadying Pt. > 75%)  FIM - Bed/Chair Transfer Bed/Chair Transfer Assistive Devices: Arm rests Bed/Chair Transfer: 4: Chair or W/C > Bed: Min A (steadying Pt. > 75%);4: Bed > Chair or W/C: Min A (steadying Pt. > 75%)  FIM - Locomotion: Wheelchair Locomotion: Wheelchair: 0: Activity did not occur FIM - Locomotion: Ambulation Locomotion: Ambulation Assistive Devices: Other (comment) (shopping cart or R HHA)  Ambulation/Gait Assistance: 4:  Min assist Locomotion: Ambulation: 4: Travels 150 ft or more with minimal assistance (Pt.>75%)  Comprehension Comprehension Mode: Auditory Comprehension: 1-Understands basic less than 25% of the time/requires cueing 75% of the time  Expression Expression Mode: Verbal Expression: 1-Expresses basis less than 25% of the time/requires cueing greater than 75% of the time.  Social Interaction Social Interaction: 1-Interacts appropriately less than 25% of the time. May be withdrawn or combative.  Problem Solving Problem Solving: 1-Solves basic less than 25% of the time - needs direction nearly all the time or does not effectively solve problems and may need a restraint for safety  Memory Memory: 1-Recognizes or recalls less than 25% of the time/requires cueing greater than 75% of the time  Medical Problem List and Plan:  1. Functional deficits secondary to TBI  2. DVT Prophylaxis/Anticoagulation: Mechanical: Antiembolism stockings, thigh (TED hose) Bilateral lower extremities  Sequential compression devices, below knee Bilateral lower extremities  3. Pain Management: Monitor for outward signs of distress and medicate as appropriate.  4. Mood: Not able to determine at this time. H/o anxiety/depression/withdrawal with recommendation for outpatient treatment 02/2014. Will monitor as mentation improves. LCSW to follow along for evaluation/support when appropriate.  5. Neuropsych: This patient is not capable of making decisions on his own behalf.   -vail bed still required due to safety  -increase ritalin for improved attention and initation  -tegretol for mood stabilization   -hs seroquel--added daytime dosing to assist with agitation 6. Skin/Wound Care: Monitor multiple abrasions on extremities for healing. Pressure relief measures. Nutritional supplement to promote wound healing. Question po intake with fluctuating mental status.  7. Advanced Cirrhosis of liver/Liver mass?: Continue lactulose  tid. ammonia levels pending  -MRI 12/2010 with two small suspicious liver lesions with recommendations for follow up at that time. (Had seen Dr. Benson Norway 2012 [place])  8. Left clavicle fracture: NWB LUE. Sling for support as able.  9. ABLA:    10. Activation: dc amantadine, added ritalin 11. Vitamin B 12 deficiency   -recheck labs pending  LOS (Days) 11 A FACE TO FACE EVALUATION WAS PERFORMED  Blakelynn Scheeler T 05/11/2014 8:04 AM

## 2014-05-11 NOTE — Progress Notes (Signed)
Physical Therapy Session Note  Patient Details  Name: Jose Krueger MRN: [id number] Date of Birth: 10-31-1955  Today's Date: 05/11/2014 PT Individual Time: 0900-1000 PT Individual Time Calculation (min): 60 min   Short Term Goals: Week 2:  PT Short Term Goal 1 (Week 2): STGs=LTGs  Skilled Therapeutic Interventions/Progress Updates:    Patient received sitting in wheelchair at RN station. Session focused on initiation and sustained attention with functional mobility tasks. Functional ambulation 174' x1 with R HHA and minA, facilitation for increased gait speed and patient demonstrates improved reciprocal pattern. Stair negotiation of 6 steps with R handrail and minA, cues for sequencing. Obstacle course requiring patient to step over several different sized obstacles and across 2 pieces of foam, ending with alternating toe taps on 2 different sized cones, performed x2. Inverted T stretch on wedge to facilitate lumbar and thoracic extension for improved upright posture.   Patient requesting drink, motivated to walk to retrieve drink, >150' (on carpet and tile) with R HHA and minA, reaching for cups, can, etc. With minA. Card game of war, patient able to explain rules with min questioning cues, requires mod cues for sustained attention to task and min cues for accuracy with rules x10 min game. Patient returned to RN station and left sitting with seatbelt donned.  Therapy Documentation Precautions:  Precautions Precautions: Fall Precaution Comments: left scapula and left clavicle fractures Required Braces or Orthoses: Sling Restrictions Weight Bearing Restrictions: Yes LUE Weight Bearing: Non weight bearing Other Position/Activity Restrictions: per ortho notes: AROM allowed for LT UE Pain: Pain Assessment Pain Assessment: No/denies pain Pain Score: 0-No pain Faces Pain Scale: No hurt Locomotion : Ambulation Ambulation/Gait Assistance: 4: Min assist   See FIM for current functional  status  Therapy/Group: Individual Therapy  Lillia Abed. Serina Nichter, PT, DPT 05/11/2014, 9:59 AM

## 2014-05-11 NOTE — Progress Notes (Signed)
Attempted to give patient a suppository since LBM 05/08/14, patient was hesitant to let me administer, therefore suppository not in anal cavity.  Patient would not let me retrieve the suppository and started to get physically combative as I tried.  Brita Romp, RN

## 2014-05-11 NOTE — Progress Notes (Signed)
Physical Therapy Session Note  Patient Details  Name: Jose Krueger MRN: [id number] Date of Birth: 11-Sep-1955  Today's Date: 05/11/2014 PT Individual Time: 1431-1440 PT Individual Time Calculation (min): 9 min   Short Term Goals: Week 2:  PT Short Term Goal 1 (Week 2): STGs=LTGs  Skilled Therapeutic Interventions/Progress Updates:    Patient received sitting in recliner at RN station, eyes closed, but vocalizing/moaning/groaning. With multimodal cues and encouragement x2 people, patient agreeable to ambulate to room, performs sit>stand with minA. Functional ambulation x80' with R HHA and minA, patient with intermittent eye closing, requires max cues for navigation. Attempted to have patient use bathroom, but unwilling and transferred straight to bed. Patient left supine in bed with all needs within reach and enclosure bed secured.  Therapy Documentation Precautions:  Precautions Precautions: Fall Precaution Comments: left scapula and left clavicle fractures Required Braces or Orthoses: Sling Restrictions Weight Bearing Restrictions: Yes LUE Weight Bearing: Non weight bearing Other Position/Activity Restrictions: per ortho notes: AROM allowed for LT UE General: PT Amount of Missed Time (min): 21 Minutes PT Missed Treatment Reason: Patient fatigue;Patient unwilling to participate Pain: Pain Assessment Pain Assessment: No/denies pain  See FIM for current functional status  Therapy/Group: Individual Therapy  Jose Krueger, PT, DPT 05/11/2014, 5:06 PM

## 2014-05-11 NOTE — Progress Notes (Signed)
Occupational Therapy Session Note  Patient Details  Name: Jose Krueger MRN: [id number] Date of Birth: 07/04/1956  Today's Date: 05/11/2014 OT Individual Time: 1100-1200 OT Individual Time Calculation (min): 60 min    Short Term Goals: Week 2:  OT Short Term Goal 1 (Week 2): Pt will follow 1 step commands 50% of time with max multimodal cues OT Short Term Goal 2 (Week 2): Pt will consistently demonstrate sustained attention to functional activity 1 min  OT Short Term Goal 3 (Week 2): Pt will be oriented x3 with max multimodal cues OT Short Term Goal 4 (Week 2): Pt with stand during self-care task at supervision level for 30 seconds  Skilled Therapeutic Interventions/Progress Updates:    Pt seen for ADL retraining with focus on initiation, attention, dynamic standing balance, sequencing, and functional mobility. Pt received at nurses station. Pt motivated to wash clothes this AM, completing in standing with mod cues for use of machine. Completed shower with max cues for sequencing and completion. Ambulated out of bathroom with min HHA. Completed dressing from w/c with pt initiating donning sock. Required mod cues and more than reasonable amount of time to complete remainder of dressing. Pt assisted with clean-up via picking up towels from floor and placing in bag with min assist for balance. Pt began fatiguing towards end of session, requiring max cues for participation. Pt requesting drink, requiring max cues for initiation to retrieve via ambulation and in standing. Pt then removed clothing from washer with SBA and max cues for adhering to NWB precautions. Pt left sitting in w/c at nurses station with QRB donned.   Therapy Documentation Precautions:  Precautions Precautions: Fall Precaution Comments: left scapula and left clavicle fractures Required Braces or Orthoses: Sling Restrictions Weight Bearing Restrictions: Yes LUE Weight Bearing: Non weight bearing Other Position/Activity  Restrictions: per ortho notes: AROM allowed for LT UE General:   Vital Signs:   Pain: Pt reports pain in back, however no numerical rating. Pt agreeable to pain meds at end of session. RN notified.   See FIM for current functional status  Therapy/Group: Individual Therapy  Duayne Cal 05/11/2014, 12:03 PM

## 2014-05-12 ENCOUNTER — Inpatient Hospital Stay (HOSPITAL_COMMUNITY): Payer: Self-pay

## 2014-05-12 DIAGNOSIS — S069X9A Unspecified intracranial injury with loss of consciousness of unspecified duration, initial encounter: Secondary | ICD-10-CM

## 2014-05-12 DIAGNOSIS — S2249XA Multiple fractures of ribs, unspecified side, initial encounter for closed fracture: Secondary | ICD-10-CM

## 2014-05-12 DIAGNOSIS — S069XAA Unspecified intracranial injury with loss of consciousness status unknown, initial encounter: Secondary | ICD-10-CM

## 2014-05-12 DIAGNOSIS — S42009A Fracture of unspecified part of unspecified clavicle, initial encounter for closed fracture: Secondary | ICD-10-CM

## 2014-05-12 MED ORDER — NICOTINE 14 MG/24HR TD PT24
14.0000 mg | MEDICATED_PATCH | Freq: Every day | TRANSDERMAL | Status: DC
  Administered 2014-05-12 – 2014-05-15 (×4): 14 mg via TRANSDERMAL
  Filled 2014-05-12 (×6): qty 1

## 2014-05-12 MED ORDER — LORAZEPAM 2 MG/ML IJ SOLN
1.0000 mg | INTRAMUSCULAR | Status: DC | PRN
  Administered 2014-05-12: 1 mg via INTRAMUSCULAR

## 2014-05-12 NOTE — Progress Notes (Signed)
Jose Krueger is a 58 y.o. male 06/01/1956 [id number]  Subjective: Restless night, poor response to sleep meds per RN, now sedate and hard to arouse.  Objective: Vital signs in last 24 hours: Temp:  [98 F (36.7 C)] 98 F (36.7 C) (09/11 2002) Pulse Rate:  [89] 89 (09/11 2002) Resp:  [18] 18 (09/11 2002) BP: (124)/(58) 124/58 mmHg (09/11 2002) SpO2:  [97 %] 97 % (09/11 2002) Weight change:  Last BM Date: 05/11/14  Intake/Output from previous day: 09/11 0701 - 09/12 0700 In: 240 [P.O.:240] Out: -   Physical Exam General: in [facility], sedate/somnelent - difficult to arouse, nonverbal    Lungs: Normal effort. Lungs clear to auscultation anteriorly, no crackles or wheezes. Cardiovascular: Regular rate and rhythm, no edema Wounds:  Clean, dry, intact. No signs of infection.  Lab Results: BMET    Component Value Date/Time   NA 135* 05/01/2014 0520   K 4.3 05/01/2014 0520   CL 103 05/01/2014 0520   CO2 24 05/01/2014 0520   GLUCOSE 96 05/01/2014 0520   BUN 8 05/01/2014 0520   CREATININE 0.43* 05/01/2014 0520   CALCIUM 8.5 05/01/2014 0520   GFRNONAA >90 05/01/2014 0520   GFRAA >90 05/01/2014 0520   CBC    Component Value Date/Time   WBC 5.5 05/01/2014 0520   RBC 3.19* 05/01/2014 0520   HGB 10.4* 05/01/2014 0520   HCT 30.8* 05/01/2014 0520   PLT 204 05/01/2014 0520   MCV 96.6 05/01/2014 0520   MCH 32.6 05/01/2014 0520   MCHC 33.8 05/01/2014 0520   RDW 16.6* 05/01/2014 0520   LYMPHSABS 2.3 05/01/2014 0520   MONOABS 0.9 05/01/2014 0520   EOSABS 0.2 05/01/2014 0520   BASOSABS 0.0 05/01/2014 0520   CBG's (last 3):  No results found for this basename: GLUCAP,  in the last 72 hours LFT's Lab Results  Component Value Date   ALT 34 05/01/2014   AST 68* 05/01/2014   ALKPHOS 178* 05/01/2014   BILITOT 1.7* 05/01/2014    Studies/Results: No results found.  Medications:  I have reviewed the patient's current medications. Scheduled Medications: . antiseptic oral rinse  7 mL Mouth Rinse q12n4p  . carbamazepine  200 mg  Oral TID  . chlorhexidine  15 mL Mouth Rinse BID  . feeding supplement (ENSURE COMPLETE)  237 mL Oral BID BM  . folic acid  1 mg Oral Daily  . lactulose  45 g Oral TID  . levofloxacin  250 mg Oral Daily  . methylphenidate  10 mg Oral BID WC  . multivitamin with minerals  1 tablet Oral Daily  . pantoprazole sodium  40 mg Oral Daily  . polyethylene glycol  17 g Oral Daily  . QUEtiapine  25 mg Oral Daily  . QUEtiapine  75 mg Oral QHS  . thiamine  100 mg Oral Daily   PRN Medications: acetaminophen, alum & mag hydroxide-simeth, bisacodyl, diphenhydrAMINE, guaiFENesin-dextromethorphan, ipratropium-albuterol, oxyCODONE, prochlorperazine, prochlorperazine, prochlorperazine, sodium chloride, traMADol, traZODone  Assessment/Plan: Active Problems:   TBI (traumatic brain injury)   1. Functional deficits secondary to TBI  2. DVT Prophylaxis/Anticoagulation: Mechanical: Antiembolism stockings, thigh (TED hose) Bilateral lower extremities  Sequential compression devices, below knee Bilateral lower extremities  3. Pain Management: Monitor for outward signs of distress and medicate as appropriate.  4. Mood: Not able to determine at this time. H/o anxiety/depression/withdrawal with recommendation for outpatient treatment 02/2014. Will monitor as mentation improves. LCSW to follow along for evaluation/support when appropriate.  5. Neuropsych: This patient is  not capable of making decisions on his own behalf.  -vail bed still required due to safety  - on ritalin for improved attention and initation  -tegretol for mood stabilization  -bid seroquel--for sleep and to assist with agitation  6. Skin/Wound Care: Monitor multiple abrasions on extremities for healing. Pressure relief measures. Nutritional supplement to promote wound healing. Question po intake with fluctuating mental status.  7. Advanced Cirrhosis of liver/Liver mass?: Continue lactulose tid.  -MRI 12/2010 with two small suspicious liver  lesions with recommendations for follow up at that time. (Had seen Dr. Benson Norway 2012 [place])  8. Left clavicle fracture: s/p ORIF -NWB LUE. Sling for support as able.  9. ABLA: Hgb stable without further loss noted 10.  Vitamin B12 deficiency -on replacement -  11. Pseudomonas UTI on Ucx 9/9 - on levoflox since 9/10 x 7d planned - note sensitivity shows intermit to cipro - continue same FQ as Afeb at this time  Length of stay, days: 12   Valerie A. Asa Lente, MD 05/12/2014, 11:52 AM

## 2014-05-12 NOTE — Progress Notes (Addendum)
Physical Therapy Session Note  Patient Details  Name: Jose Krueger MRN: [id number] Date of Birth: Jan 28, 1956  Today's Date: 05/12/2014 PT Individual Time: 0915-0930  PT Individual Time Calculation (min): 42min PT Missed Time: 30 Minutes Missed Time Reason: Patient fatigue  Short Term Goals: Week 2:  PT Short Term Goal 1 (Week 2): STGs=LTGs  Skilled Therapeutic Interventions/Progress Updates:  1:1. Pt received sitting in recliner at nurses station with eyes closed. Therapist attempting max multimodal cues (changing positioning of recliner, cold/noxious stimuli, loud noises, bringing pt to unsupported sitting) x25min to increase alertness/arousal but unsuccessful. Pt with no vocalizations, verbalizations or eye opening. RN reporting that pt pt did not sleep last night and req total Ax2persons to be transferred out of bed due to heavy incontinence. Pt left sitting in recliner at nurses station w/ quick release belt in place. PT reattempted 29min later, pt found with EO and req min cues to initiate standing. Focus on functional ambulation for increased alertness/arousal. Pt amb 175'x1 and 200'x1 w/ min R HHA. Therapist facilitating increased pace for more fluid reciprocal gait pattern, however, pt demonstrating overall flexed trunk posture with short stutter steps. Pt holding L upper chest/shoulder throughout session, shaking head yes to pain. No vocalizations or verbalizations. RN made aware. Pt left sitting in recliner at nurses station with quick release belt in place.    Therapy Documentation Precautions:  Precautions Precautions: Fall Precaution Comments: left scapula and left clavicle fractures Required Braces or Orthoses: Sling Restrictions Weight Bearing Restrictions: Yes LUE Weight Bearing: Non weight bearing Other Position/Activity Restrictions: per ortho notes: AROM allowed for LT UE General: PT Amount of Missed Time (min): 30 Minutes PT Missed Treatment Reason: Patient  fatigue  See FIM for current functional status  Therapy/Group: Individual Therapy  Gilmore Laroche 05/12/2014, 12:24 PM

## 2014-05-12 NOTE — Progress Notes (Signed)
Gave 1 mg ativan iv wasted 1 mg  Witnessed by [employer]

## 2014-05-12 NOTE — Progress Notes (Signed)
Patient refused to get back in bed he wanted a cig or nicotine patch . He was leaving the hospital  . i called md to get  Order for nicotine put this on him. Still upset. Called security to put him in bed . He tore up zippers. He got out of bed  Trying to leave. md called for an ativan order. Gave at 1752.  Security stayed . Ordered new bed . Back in old bed  .

## 2014-05-12 NOTE — Progress Notes (Signed)
Social Work Patient ID: Jose Krueger, male   DOB: Jun 17, 1956, 58 y.o.   MRN: [id number]   LATE ENTRY:  Have spoken with pt's gf, Jose Krueger, to review team conference and concerns about pt's continued care needs due to limited gains in cognition.  Gf seems to understand these concerns and agrees with this assessment.  Discussed concern that his care is greater than should be expected for teenagers to provide.  Gf is agreeable with change in plsn to SNF, however, "disappointed he isn't getting better."  Will begin SNF bed search which may prove difficult as pt currently still in enclosure bed and is pending his MA.    Diego Delancey, LCSW

## 2014-05-13 ENCOUNTER — Inpatient Hospital Stay (HOSPITAL_COMMUNITY): Payer: Medicaid Other

## 2014-05-13 NOTE — Progress Notes (Signed)
Jose Krueger is a 58 y.o. male 05/20/1956 [id number]  Subjective: Reports shoulder pain and requests for [place] to call "Grayland Ormond", but unable to provide details on contact or context.  Objective: Vital signs in last 24 hours: Temp:  [97.8 F (36.6 C)-98.8 F (37.1 C)] 98.8 F (37.1 C) (09/13 0500) Pulse Rate:  [85-94] 85 (09/13 0500) Resp:  [17-18] 17 (09/13 0500) BP: (134-155)/(79-82) 134/79 mmHg (09/13 0500) SpO2:  [99 %-100 %] 99 % (09/13 0500) Weight change:  Last BM Date: 05/11/14  Intake/Output from previous day: 09/12 0701 - 09/13 0700 In: 240 [P.O.:240] Out: -   Physical Exam General: in [facility], aide at side - calm at present, slightly sedate    Lungs: Normal effort. Lungs clear to auscultation anteriorly, no crackles or wheezes. Cardiovascular: Regular rate and rhythm, no edema Wounds:  ORIF incision Clean, dry, intact. No signs of infection.  Lab Results: BMET    Component Value Date/Time   NA 135* 05/01/2014 0520   K 4.3 05/01/2014 0520   CL 103 05/01/2014 0520   CO2 24 05/01/2014 0520   GLUCOSE 96 05/01/2014 0520   BUN 8 05/01/2014 0520   CREATININE 0.43* 05/01/2014 0520   CALCIUM 8.5 05/01/2014 0520   GFRNONAA >90 05/01/2014 0520   GFRAA >90 05/01/2014 0520   CBC    Component Value Date/Time   WBC 5.5 05/01/2014 0520   RBC 3.19* 05/01/2014 0520   HGB 10.4* 05/01/2014 0520   HCT 30.8* 05/01/2014 0520   PLT 204 05/01/2014 0520   MCV 96.6 05/01/2014 0520   MCH 32.6 05/01/2014 0520   MCHC 33.8 05/01/2014 0520   RDW 16.6* 05/01/2014 0520   LYMPHSABS 2.3 05/01/2014 0520   MONOABS 0.9 05/01/2014 0520   EOSABS 0.2 05/01/2014 0520   BASOSABS 0.0 05/01/2014 0520   CBG's (last 3):  No results found for this basename: GLUCAP,  in the last 72 hours LFT's Lab Results  Component Value Date   ALT 34 05/01/2014   AST 68* 05/01/2014   ALKPHOS 178* 05/01/2014   BILITOT 1.7* 05/01/2014    Studies/Results: No results found.  Medications:  I have reviewed the patient's current medications. Scheduled  Medications: . antiseptic oral rinse  7 mL Mouth Rinse q12n4p  . carbamazepine  200 mg Oral TID  . chlorhexidine  15 mL Mouth Rinse BID  . feeding supplement (ENSURE COMPLETE)  237 mL Oral BID BM  . folic acid  1 mg Oral Daily  . lactulose  45 g Oral TID  . levofloxacin  250 mg Oral Daily  . methylphenidate  10 mg Oral BID WC  . multivitamin with minerals  1 tablet Oral Daily  . nicotine  14 mg Transdermal Daily  . pantoprazole sodium  40 mg Oral Daily  . polyethylene glycol  17 g Oral Daily  . QUEtiapine  25 mg Oral Daily  . QUEtiapine  75 mg Oral QHS  . thiamine  100 mg Oral Daily   PRN Medications: acetaminophen, alum & mag hydroxide-simeth, bisacodyl, diphenhydrAMINE, guaiFENesin-dextromethorphan, ipratropium-albuterol, LORazepam, oxyCODONE, prochlorperazine, prochlorperazine, prochlorperazine, sodium chloride, traMADol, traZODone  Assessment/Plan: Active Problems:   TBI (traumatic brain injury)   1. Functional deficits secondary to TBI  2. DVT Prophylaxis/Anticoagulation: Mechanical: Antiembolism stockings, thigh (TED hose) Bilateral lower extremities  Sequential compression devices, below knee Bilateral lower extremities  3. Pain Management: Monitor for outward signs of distress and medicate as appropriate.  4. Mood: Not able to determine at this time. H/o anxiety/depression/withdrawal with recommendation for  outpatient treatment 02/2014. Will monitor as mentation improves. LCSW to follow along for evaluation/support when appropriate.  5. Neuropsych: This patient is not capable of making decisions on his own behalf.  -vail bed still required due to safety  - on ritalin for improved attention and initation  -tegretol for mood stabilization  -bid seroquel--for sleep and to assist with agitation  6. Skin/Wound Care: Monitor multiple abrasions on extremities for healing. Pressure relief measures. Nutritional supplement to promote wound healing. Question po intake with fluctuating  mental status.  7. Advanced Cirrhosis of liver/Liver mass?: Continue lactulose tid.  -MRI 12/2010 with two small suspicious liver lesions with recommendations for follow up at that time. (Had seen Dr. Benson [place] 2012 [place])  8. Left clavicle fracture: s/p ORIF -NWB LUE. Sling for support as able. Pain meds prn 9. ABLA: Hgb stable without further loss noted 10.  Vitamin B12 deficiency -on replacement -  11. Pseudomonas UTI on Ucx 9/9 - on levoflox since 9/10 x 7d planned - note sensitivity shows intermit to cipro - continue same FQ as Afeb at this time  Length of stay, days: 13   Tashica Provencio A. Asa Lente, MD 05/13/2014, 10:06 AM

## 2014-05-13 NOTE — Progress Notes (Signed)
Occupational Therapy Session Note  Patient Details  Name: NESHAWN AIRD MRN: [id number] Date of Birth: [birth year]  Today's Date: 05/13/2014 OT Individual Time: 6378-5885 OT Individual Time Calculation (min): 33 min    Short Term Goals: Week 2:  OT Short Term Goal 1 (Week 2): Pt will follow 1 step commands 50% of time with max multimodal cues OT Short Term Goal 2 (Week 2): Pt will consistently demonstrate sustained attention to functional activity 1 min  OT Short Term Goal 3 (Week 2): Pt will be oriented x3 with max multimodal cues OT Short Term Goal 4 (Week 2): Pt with stand during self-care task at supervision level for 30 seconds  Skilled Therapeutic Interventions/Progress Updates:    Pt seen for 1:1 OT session with focus on command following, attention, initiation, sequencing, and dynamic standing balance. Pt received sitting in posey bed. Pt required max cues of encouragement for participation. Ambulated to recliner chair with SBA. Pt agreeable to changing clothes this AM however declining bathing. Pt with increased confusion as he attempted to dress LB with towel instead of pants. Pt with increased frustration when cued from therapist. Pt required total assist to initiate donning correct LB clothing then able to complete with setup assist and more than reasonable amount of time d/t poor attention and initiation. Pt agreeable to eat breakfast. Pt attempting to drink from oatmeal cup and cup with lid, requiring total assist to orient. Pt oriented to person only with no carryover once therapist oriented pt several times. Pt with increased frustration throughout therapy session as he was cursing at therapist multiple times. Pt refusing further participation. Pt left sitting in recliner chair with sitter present.   Therapy Documentation Precautions:  Precautions Precautions: Fall Precaution Comments: left scapula and left clavicle fractures Required Braces or Orthoses:  Sling Restrictions Weight Bearing Restrictions: Yes LUE Weight Bearing: Non weight bearing Other Position/Activity Restrictions: per ortho notes: AROM allowed for LT UE General: General OT Amount of Missed Time: 27 Minutes Vital Signs: Therapy Vitals Temp: 98.8 F (37.1 C) Temp src: Oral Pulse Rate: 85 Resp: 17 BP: 134/79 mmHg Patient Position (if appropriate): Lying Oxygen Therapy SpO2: 99 % O2 Device: None (Room air) Pain:  No report of pain during therapy session.  See FIM for current functional status  Therapy/Group: Individual Therapy  Duayne Cal 05/13/2014, 8:34 AM

## 2014-05-14 ENCOUNTER — Inpatient Hospital Stay (HOSPITAL_COMMUNITY): Payer: Medicaid Other | Admitting: *Deleted

## 2014-05-14 ENCOUNTER — Inpatient Hospital Stay (HOSPITAL_COMMUNITY): Payer: Medicaid Other

## 2014-05-14 ENCOUNTER — Inpatient Hospital Stay (HOSPITAL_COMMUNITY): Payer: Medicaid Other | Admitting: Speech Pathology

## 2014-05-14 DIAGNOSIS — S2249XA Multiple fractures of ribs, unspecified side, initial encounter for closed fracture: Secondary | ICD-10-CM

## 2014-05-14 DIAGNOSIS — S069XAA Unspecified intracranial injury with loss of consciousness status unknown, initial encounter: Secondary | ICD-10-CM

## 2014-05-14 DIAGNOSIS — F1010 Alcohol abuse, uncomplicated: Secondary | ICD-10-CM

## 2014-05-14 DIAGNOSIS — S42009A Fracture of unspecified part of unspecified clavicle, initial encounter for closed fracture: Secondary | ICD-10-CM

## 2014-05-14 DIAGNOSIS — S069X9A Unspecified intracranial injury with loss of consciousness of unspecified duration, initial encounter: Secondary | ICD-10-CM

## 2014-05-14 NOTE — Progress Notes (Signed)
Physical Therapy Session Note  Patient Details  Name: Jose Krueger MRN: [id number] Date of Birth: 06/29/1956  Today's Date: 05/14/2014 PT Co-Treatment Time: 1140-1200 (Co-tx with SLP from 1115-1200) PT Co-Treatment Time Calculation (min): 20 min  Short Term Goals: Week 2:  PT Short Term Goal 1 (Week 2): STGs=LTGs  Skilled Therapeutic Interventions/Progress Updates:  Co-treatment with SLP focused on cognitive-linguistic goals (see note by Weston Anna, SLP for details), functional endurance and functional use of bilateral upper extremities. Pt req max multimodal cues for functional problem solving, selective attention and working memory with a basic kitchen task of making pudding. Patient performed ~75% of task while ambulating (multiple bouts >150')/standing req min guard-min A with occasional HHA. Pt req less cueing with task in sitting due to increased sustained attention to task. Pt req mod encouragement to utilize L UE during cooking task as well as making drink at nurses station at end of session. Pt req mod multimodal cues to locate room at end of session w/ use of signs, etc. Patient left in recliner with all needs in reach, quick release belt in place,sitter present and RN present to address pain.  Therapy Documentation Precautions:  Precautions Precautions: Fall Precaution Comments: left scapula and left clavicle fractures Required Braces or Orthoses: Sling Restrictions Weight Bearing Restrictions: Yes LUE Weight Bearing: Non weight bearing Other Position/Activity Restrictions: per ortho notes: AROM allowed for LT UE Pain: Pain Assessment Pain Assessment: Faces Pain Score: 0-No pain Faces Pain Scale: Hurts even more Pain Type: Acute pain Pain Location: Shoulder Pain Orientation: Left Pain Descriptors / Indicators: Aching Pain Frequency: Intermittent Pain Onset: Gradual Patients Stated Pain Goal: 4 Pain Intervention(s): Medication (See eMAR)  See FIM for current  functional status  Therapy/Group: Individual Therapy  Gilmore Laroche 05/14/2014, 1:16 PM

## 2014-05-14 NOTE — Progress Notes (Addendum)
Speech Language Pathology Daily Session Note  Patient Details  Name: Jose Krueger MRN: [id number] Date of Birth: 08-16-1956  Today's Date: 05/14/2014 SLP Co-Treatment Time: 1115-1140 SLP Co-Treatment Time Calculation (min): 25 min  Short Term Goals: Week 2: SLP Short Term Goal 1 (Week 2): Patient will demonstrate focused attention to a functioanl task for 30 seconds with Max A multimodal cues.  SLP Short Term Goal 2 (Week 2): Patinet will initate functional tasks with Max A multimodal cues.  SLP Short Term Goal 3 (Week 2): Patient will verbally express basic wants/needs with Max A multimodal cues.  SLP Short Term Goal 4 (Week 2): Pt will consume current diet with Mod A mulitmodal cues for utilziation of swallowing compensatory strateiges to minimal overt s/s of aspiration   Skilled Therapeutic Interventions: Co-treatment with PT focused on cognitive-linguistic goals, functional endurance and functional use of bilateral upper extremities. SLP facilitated session by providing Max A multimodal cues for functional problem solving, selective attention and working memory with a basic kitchen task of making pudding. Patient performed ~75% of task while ambulating/standing and required less cueing with sitting due to increased sustained attention to task.  Patient ambulated back to his room and required Mod A multimodal cues to locate his room with use of signs, etc. Patient left in recliner with quick release belt in place and sitter present. Continue with current plan of care.    FIM:  Comprehension Comprehension Mode: Auditory Comprehension: 2-Understands basic 25 - 49% of the time/requires cueing 51 - 75% of the time Expression Expression Mode: Verbal Expression: 2-Expresses basic 25 - 49% of the time/requires cueing 50 - 75% of the time. Uses single words/gestures. Social Interaction Social Interaction: 3-Interacts appropriately 50 - 74% of the time - May be physically or verbally  inappropriate. Problem Solving Problem Solving: 2-Solves basic 25 - 49% of the time - needs direction more than half the time to initiate, plan or complete simple activities Memory Memory: 2-Recognizes or recalls 25 - 49% of the time/requires cueing 51 - 75% of the time  Pain Pain Assessment Pain Assessment: Faces Pain Score: 0-No pain Faces Pain Scale: Hurts even more Pain Type: Acute pain Pain Location: Shoulder Pain Orientation: Left Pain Descriptors / Indicators: Aching Pain Frequency: Intermittent Pain Onset: Gradual Patients Stated Pain Goal: 4 Pain Intervention(s): Medication (See eMAR)  Therapy/Group: Individual Therapy  Sarabeth Benton, Angus 05/14/2014, 12:14 PM

## 2014-05-14 NOTE — Progress Notes (Signed)
Occupational Therapy Session Note  Patient Details  Name: Jose Krueger MRN: [id number] Date of Birth: 1955-12-10  Today's Date: 05/14/2014 OT Individual Time: 5885-0277 OT Individual Time Calculation (min): 45 min    Short Term Goals: Week 2:  OT Short Term Goal 1 (Week 2): Pt will follow 1 step commands 50% of time with max multimodal cues OT Short Term Goal 2 (Week 2): Pt will consistently demonstrate sustained attention to functional activity 1 min  OT Short Term Goal 3 (Week 2): Pt will be oriented x3 with max multimodal cues OT Short Term Goal 4 (Week 2): Pt with stand during self-care task at supervision level for 30 seconds  Skilled Therapeutic Interventions/Progress Updates:    Pt seen for ADL retraining with focus on command following, functional mobility, attention, dynamic balance, and cognitive remediation. Pt received in recliner chair and willing to shower this AM. Pt ambulated to bathroom with min HHA and mod cues. Completed bathing with pt sustaining attention to task for 35 seconds. Pt required min cues to wash all body parts. Pt stood for 50% of task at min-SBA. Completed dressing with mod cues for attention and sequencing. Pt ambulated to family room to retrieve drink with min HHA and mod cues. Pt ambulated back to room with no cues for way finding. Pt left in recliner chair with all needs in reach.  Provided education to pt's significant other [name]) on BI, CLOF, cognitive deficits, etc. [name] with little evidence of learning and needs continued education.   Therapy Documentation Precautions:  Precautions Precautions: Fall Precaution Comments: left scapula and left clavicle fractures Required Braces or Orthoses: Sling Restrictions Weight Bearing Restrictions: Yes LUE Weight Bearing: Non weight bearing Other Position/Activity Restrictions: per ortho notes: AROM allowed for LT UE General:   Vital Signs:   Pain: Pain Assessment Pain Assessment: Faces Pain  Score: 0-No pain Faces Pain Scale: Hurts even more Pain Type: Acute pain Pain Location: Shoulder Pain Orientation: Left Pain Descriptors / Indicators: Aching Pain Frequency: Intermittent Pain Onset: Gradual Patients Stated Pain Goal: 4 Pain Intervention(s): Medication (See eMAR)  See FIM for current functional status  Therapy/Group: Individual Therapy  Duayne Cal 05/14/2014, 1:26 PM

## 2014-05-14 NOTE — Progress Notes (Signed)
[place] PHYSICAL MEDICINE & REHABILITATION     PROGRESS NOTE    Subjective/Complaints: Pain at times. Confused. Agitation perhaps less  Objective: Vital Signs: Blood pressure 145/72, pulse 84, temperature 98.7 F (37.1 C), temperature source Oral, resp. rate 18, weight 76.34 kg (168 lb 4.8 oz), SpO2 99.00%. No results found. No results found for this basename: WBC, HGB, HCT, PLT,  in the last 72 hours No results found for this basename: NA, K, CL, CO, GLUCOSE, BUN, CREATININE, CALCIUM,  in the last 72 hours CBG (last 3)  No results found for this basename: GLUCAP,  in the last 72 hours  Wt Readings from Last 3 Encounters:  05/09/14 76.34 kg (168 lb 4.8 oz)  04/30/14 83.416 kg (183 lb 14.4 oz)  04/30/14 83.416 kg (183 lb 14.4 oz)    Physical Exam:  Nursing note and vitals reviewed.  Constitutional: He appears well-developed and well-nourished. Cervical collar and nasal cannula in place.  Needed sternal rubs to awaken briefly. Listing to the right in bed and resisting attempts at repositioning. BUE in mittens. Moaning occasionally.  HENT:  Head: Normocephalic.  Neck:  Supple. Central line in place right neck. Left clavicle incision with steri strips in place (no sling)  Cardiovascular: Regular rhythm.  Respiratory: Tachypnea noted. He has rhonchi.  Wet upper airway sounds. ronchi with occasional wheeze  GI: Soft. Bowel sounds are normal. He exhibits no distension.  Musculoskeletal: He exhibits no edema.  LUE with  abraded area from shoulder to mid forearm with some scabbing. Bilateral patella with large abraded areas with loss of epidermal tissue with pink underlying tissue. Left with large   scab. Two additional abrasion on left lateral leg.  Neurological:  Remains   Confused. Limited eye contact. Does not recognize name with choice of two. Won't participate in examination. moves all 4's     Assessment/Plan: 1. Functional deficits secondary to TBI, polytrauma which  require 3+ hours per day of interdisciplinary therapy in a comprehensive inpatient rehab setting. Physiatrist is providing close team supervision and 24 hour management of active medical problems listed below. Physiatrist and rehab team continue to assess barriers to discharge/monitor patient progress toward functional and medical goals.  FIM: FIM - Bathing Bathing Steps Patient Completed: Chest;Abdomen;Right Arm;Left Arm;Front perineal area Bathing: 3: Mod-Patient completes 5-7 10f 10 parts or 50-74%  FIM - Upper Body Dressing/Undressing Upper body dressing/undressing steps patient completed: Thread/unthread right sleeve of pullover shirt/dresss;Thread/unthread left sleeve of pullover shirt/dress;Put head through opening of pull over shirt/dress;Pull shirt over trunk Upper body dressing/undressing: 5: Set-up assist to: Obtain clothing/put away FIM - Lower Body Dressing/Undressing Lower body dressing/undressing steps patient completed: Thread/unthread right pants leg;Thread/unthread left pants leg;Pull pants up/down;Don/Doff right sock;Don/Doff left sock Lower body dressing/undressing: 3: Mod-Patient completed 50-74% of tasks  FIM - Toileting Toileting steps completed by patient: Adjust clothing prior to toileting;Performs perineal hygiene Toileting Assistive Devices: Grab bar or rail for support Toileting: 1: Total-Patient completed zero steps, helper did all 3  FIM - Radio producer Devices: Grab bars Toilet Transfers: 3-To toilet/BSC: Mod A (lift or lower assist);4-From toilet/BSC: Min A (steadying Pt. > 75%)  FIM - Bed/Chair Transfer Bed/Chair Transfer Assistive Devices: Arm rests Bed/Chair Transfer: 5: Bed > Chair or W/C: Supervision (verbal cues/safety issues)  FIM - Locomotion: Wheelchair Locomotion: Wheelchair: 0: Activity did not occur FIM - Locomotion: Ambulation Locomotion: Ambulation Assistive Devices: Other (comment) (R HHA) Ambulation/Gait  Assistance: 4: Min assist Locomotion: Ambulation: 4: Travels 150 ft or  more with minimal assistance (Pt.>75%)  Comprehension Comprehension Mode: Auditory Comprehension: 1-Understands basic less than 25% of the time/requires cueing 75% of the time  Expression Expression Mode: Verbal Expression: 2-Expresses basic 25 - 49% of the time/requires cueing 50 - 75% of the time. Uses single words/gestures.  Social Interaction Social Interaction: 2-Interacts appropriately 25 - 49% of time - Needs frequent redirection.  Problem Solving Problem Solving: 2-Solves basic 25 - 49% of the time - needs direction more than half the time to initiate, plan or complete simple activities  Memory Memory: 2-Recognizes or recalls 25 - 49% of the time/requires cueing 51 - 75% of the time  Medical Problem List and Plan:  1. Functional deficits secondary to TBI  2. DVT Prophylaxis/Anticoagulation: Mechanical: Antiembolism stockings, thigh (TED hose) Bilateral lower extremities  Sequential compression devices, below knee Bilateral lower extremities  3. Pain Management: Monitor for outward signs of distress and medicate as appropriate.  4. Mood: Not able to determine at this time. H/o anxiety/depression/withdrawal with recommendation for outpatient treatment 02/2014. Will monitor as mentation improves. LCSW to follow along for evaluation/support when appropriate.  5. Neuropsych: This patient is not capable of making decisions on his own behalf.   -vail bed still required due to safety--wean as possible  -increase ritalin for improved attention and initation  -tegretol for mood stabilization   -hs seroquel--added daytime dosing to assist with agitation 6. Skin/Wound Care: Monitor multiple abrasions on extremities for healing. Pressure relief measures. Nutritional supplement to promote wound healing. Question po intake with fluctuating mental status.  7. Advanced Cirrhosis of liver/Liver mass?: Continue lactulose tid.  ammonia levels pending  -MRI 12/2010 with two small suspicious liver lesions with recommendations for follow up at that time. (Had seen Dr. Benson [place] 2012 [place])  8. Left clavicle fracture: NWB LUE. Sling for support as able.  9. ABLA:    10. Activation: dc amantadine, added ritalin 11. Vitamin B 12 deficiency  LOS (Days) 14 A FACE TO FACE EVALUATION WAS PERFORMED  Camyla Camposano T 05/14/2014 8:07 AM

## 2014-05-14 NOTE — Progress Notes (Signed)
Physical Therapy Session Note  Patient Details  Name: Jose Krueger MRN: [id number] Date of Birth: April 24, 1956  Today's Date: 05/14/2014 PT Individual Time: 0900-1000 and 2878-6767 PT Individual Time Calculation (min): 60 min and 48 min  Short Term Goals: Week 2:  PT Short Term Goal 1 (Week 2): STGs=LTGs  Skilled Therapeutic Interventions/Progress Updates:    AM Session: Patient received sitting in recliner, sitter present. Session focused on cognitive remediation activities during functional mobility tasks. Functional ambulation 261' x1 with min guard intermittently. Stair negotiation x6 stairs with R handrail and min guard. Sequencing activity in which patient required to locate 10 sticky notes in numerical order placed throughout the hallway, patient able to complete with only initial instructions, min guard for ambulation ~200'. Difficulty increased to alternating attention activity of number then letter (1, A, 2, B...etc.); Patient requires min-mod cues and still performs with errors. NuStep Level 6 with B LEs to facilitate increased activity tolerance, coordination, and sustained attention to activity. Patient reminded several times of duration of activity (7 min), but requires total cues to remember this time. After several cues throughout activity, patient eventually able to recall duration of 7 min. Patient returned to room and left sitting in recliner with seatbelt donned and sitter present.  PM Session: Patient received sitting in recliner. Session focused on cognitive remediation activities during functional mobility tasks. Four numbers placed on floor, patient instructed to tap numbers in order called out. Patient able to perform 3 and 4 number sequences correctly, but unable to complete any 5 number sequences correctly. When activity made easier and adjusted back to 4 number sequence, patient then unable to complete correctly. Resisted sit<>stands from mat with blue theraband x10,  patient repeatedly stepping with L or R after sit>stand; resisted ambulation 90' x1. Sit<>stand transfers without use of B UEs and patient instructed to count reps, unable to do so without total cues then able to progress. Patient requires activity to be broken down into steps (counting to ten first, then pairing with sit<>stands). 160'x1 with min-modA kicking yoga block, patient requires increased assistance when reaching too far outside BOS to kick.  Functional ambulation gym>room, >150' with min guard, patient repeatedly kicking with B LEs. Appears that patient demonstrates motor perseveration in that he perseverates on prior activity and unable to switch to present activity. Patient left supine in enclosure bed with sitter present.  Therapy Documentation Precautions:  Precautions Precautions: Fall Precaution Comments: left scapula and left clavicle fractures Required Braces or Orthoses: Sling Restrictions Weight Bearing Restrictions: Yes LUE Weight Bearing: Non weight bearing Other Position/Activity Restrictions: per ortho notes: AROM allowed for LT UE General: PT Amount of Missed Time (min): 12 Minutes PT Missed Treatment Reason: Patient fatigue Pain: Pain Assessment Pain Assessment: No/denies pain Pain Score: 0-No pain Faces Pain Scale: No hurt Locomotion : Ambulation Ambulation/Gait Assistance: 4: Min guard   See FIM for current functional status  Therapy/Group: Individual Therapy  Lillia Abed. Appollonia Klee, PT, DPT 05/14/2014, 2:57 PM

## 2014-05-14 NOTE — Plan of Care (Signed)
Problem: RH SAFETY Goal: RH STG ADHERE TO SAFETY PRECAUTIONS W/ASSISTANCE/DEVICE STG Adhere to Safety Precautions With mod Assistance/Device.  Outcome: Not Progressing Requiring restraints and sitter for safety

## 2014-05-15 ENCOUNTER — Inpatient Hospital Stay (HOSPITAL_COMMUNITY): Payer: Medicaid Other | Admitting: *Deleted

## 2014-05-15 ENCOUNTER — Inpatient Hospital Stay (HOSPITAL_COMMUNITY): Payer: Medicaid Other

## 2014-05-15 ENCOUNTER — Inpatient Hospital Stay (HOSPITAL_COMMUNITY): Payer: Medicaid Other | Admitting: Speech Pathology

## 2014-05-15 DIAGNOSIS — S2249XA Multiple fractures of ribs, unspecified side, initial encounter for closed fracture: Secondary | ICD-10-CM

## 2014-05-15 DIAGNOSIS — S42009A Fracture of unspecified part of unspecified clavicle, initial encounter for closed fracture: Secondary | ICD-10-CM

## 2014-05-15 DIAGNOSIS — S069XAA Unspecified intracranial injury with loss of consciousness status unknown, initial encounter: Secondary | ICD-10-CM

## 2014-05-15 DIAGNOSIS — F1010 Alcohol abuse, uncomplicated: Secondary | ICD-10-CM

## 2014-05-15 DIAGNOSIS — S069X9A Unspecified intracranial injury with loss of consciousness of unspecified duration, initial encounter: Secondary | ICD-10-CM

## 2014-05-15 MED ORDER — NICOTINE 21 MG/24HR TD PT24
21.0000 mg | MEDICATED_PATCH | Freq: Every day | TRANSDERMAL | Status: DC
  Administered 2014-05-16 – 2014-05-23 (×8): 21 mg via TRANSDERMAL
  Filled 2014-05-15 (×9): qty 1

## 2014-05-15 NOTE — Plan of Care (Signed)
Problem: RH SAFETY Goal: RH STG ADHERE TO SAFETY PRECAUTIONS W/ASSISTANCE/DEVICE STG Adhere to Safety Precautions With mod Assistance/Device.  Outcome: Not Progressing Requires restraints and a sitter for safety
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
## 2014-05-15 NOTE — Progress Notes (Signed)
Speech Language Pathology Daily Session Note  Patient Details  Name: Jose Krueger MRN: [id number] Date of Birth: May 31, 1956  Today's Date: 05/15/2014 SLP Individual Time: 1310-1355 SLP Individual Time Calculation (min): 45 min  Short Term Goals: Week 2: SLP Short Term Goal 1 (Week 2): Patient will demonstrate focused attention to a functioanl task for 30 seconds with Max A multimodal cues.  SLP Short Term Goal 2 (Week 2): Patinet will initate functional tasks with Max A multimodal cues.  SLP Short Term Goal 3 (Week 2): Patient will verbally express basic wants/needs with Max A multimodal cues.  SLP Short Term Goal 4 (Week 2): Pt will consume current diet with Mod A mulitmodal cues for utilziation of swallowing compensatory strateiges to minimal overt s/s of aspiration   Skilled Therapeutic Interventions: Skilled treatment session focused on cognitive-linguistic goals. SLP facilitated session by providing Mod A question cues for functional problem in regards to self-monitoring and correcting errors during a generative naming task with focus on attention, decreasing overall language of confusion and increased topic maintenance. Patient demonstrated appropriate selective attention to task with Mod I during a mildly distracting environment. Patient left in recliner at RN station with quick release in place. Continue with current plan of care.    FIM:  Comprehension Comprehension Mode: Auditory Comprehension: 3-Understands basic 50 - 74% of the time/requires cueing 25 - 50%  of the time Expression Expression Mode: Verbal Expression: 3-Expresses basic 50 - 74% of the time/requires cueing 25 - 50% of the time. Needs to repeat parts of sentences. Social Interaction Social Interaction: 3-Interacts appropriately 50 - 74% of the time - May be physically or verbally inappropriate. Problem Solving Problem Solving: 1-Solves basic less than 25% of the time - needs direction nearly all the time or does  not effectively solve problems and may need a restraint for safety Memory Memory: 1-Recognizes or recalls less than 25% of the time/requires cueing greater than 75% of the time  Pain Pain Assessment Pain Assessment: No/denies pain  Therapy/Group: Individual Therapy  Alvey Brockel, [place] 05/15/2014, 2:22 PM

## 2014-05-15 NOTE — Progress Notes (Signed)
Physical Therapy Session Note  Patient Details  Name: Jose Krueger MRN: [id number] Date of Birth: 09/27/1955  Today's Date: 05/15/2014 PT Individual Time: 1450-1520 PT Individual Time Calculation (min): 30 min    Skilled Therapeutic Interventions/Progress Updates:  Tx focused on cognitive remediation, dynamic balance, and functional mobility.  Pt received from nurses station, pushed to gym in recliner.  Pt engaged in horseshoe toss, performing 8 sit<>stand transfers with Min A overall for balance during transitions. Pt needed encouragement to continue activity as well as redirection from his focus on leaving hospital. Pt not easily redirected. Pt able to pitch shoes with min steadying balance, but needed up to Mod A for more dynamic movements picking up shoes from floor after each round. Pt challenged for sustained attention to task as well as adjusting technique for more accuracy.  Pt left up at nurses station with lap belt.      Therapy Documentation Precautions:  Precautions Precautions: Fall Precaution Comments: left scapula and left clavicle fractures Required Braces or Orthoses: Sling Restrictions Weight Bearing Restrictions: Yes LUE Weight Bearing: Non weight bearing Other Position/Activity Restrictions: per ortho notes: AROM allowed for LT UE   Vital Signs: Therapy Vitals Temp: 98.2 F (36.8 C) Temp src: Oral Pulse Rate: 95 Resp: 18 BP: 121/75 mmHg Patient Position (if appropriate): Sitting Oxygen Therapy SpO2: 100 % O2 Device: None (Room air) Pain: Pain Assessment Pain Assessment: No/denies pain   Locomotion : Ambulation Ambulation/Gait Assistance: 3: Mod assist   See FIM for current functional status  Therapy/Group: Individual Therapy Kennieth Rad, PT, DPT 05/15/2014, 3:52 PM

## 2014-05-15 NOTE — Progress Notes (Signed)
Physical Therapy Weekly Progress Note  Patient Details  Name: Jose Krueger MRN: [id number] Date of Birth: 1956-07-11  Beginning of progress report period: May 08, 2014 End of progress report period: May 15, 2014  Today's Date: 05/15/2014 PT Individual Time: 0900-1000 and 1420-1440 PT Individual Time Calculation (min): 60 min and 20 min  Patient has met 2 of 14 long term goals. Patient has made good progress during first reporting period on rehab, however, continues to demonstrate fluctuations in alertness/arousal, assistance required, and behavior. Patient can require as little as min guard for all functional mobility (gait, stair negotiation, transfers, and bed mobility). However, patient requires increased assistance for all functional mobility (min-modA) and max-totalA for cues. Patient with improved behavior, however inconsistent as he has continues to yell and curse intermittently. When this happens, there are episodes of non-aggressive agitation, yelling/cursing at staff members. Patient is currently demonstrating behaviors consistent with [place] Level [place].   Patient continues to demonstrate the following deficits: poor activity tolerance, cognitive deficits, decreased balance and balance strategies, decreased postural control (pain likely a contribution), decreased ability to compensate for deficits, decreased functional use of L UE, decreased attention, decreased awareness, poor safety awareness, decreased coordination, decreased knowledge of and adherence to weight bearing precautions, and therefore will continue to benefit from skilled PT intervention to enhance overall performance with activity tolerance, balance, postural control, ability to compensate for deficits, functional use of left upper extremity, attention, awareness, coordination and knowledge of precautions.  See Patient's Care Plan for progression toward long term goals.  Patient progressing toward long term goals..   Continue plan of care.  Skilled Therapeutic Interventions/Progress Updates:    AM Session: Patient received sitting in recliner. Session focused on BERG Balance Test (see details below) and functional mobility. Patient much more unsteady on his feet today, requires min-modA for functional transfers, gait training with R HHA x90', and negotiation of 5 stairs with B handrails. Patient engaged in [employer] and requires max cues for sustained attention, strategy, and following rules. Patient lethargic throughout session as well. Patient handed off to nurse tech seated in recliner with seatbelt donned.  PM Session: Patient received sitting in recliner at RN station. Patient able to don shoes without assistance, cues to tie shoes. Patient standing up impulsively and begins to ambulate, requires manual facilitation and mod cues to redirect. After ambulation x70' with R HHA and min-maxA, max A for several LOB due to unsteady gait with narrow BOS and intermittent scissoring, patient requires sitting rest break. Patient becoming increasingly irritated with therapist, standing up impulsively and stating he is "going to the car". When therapist put hands on patient to assist, patient attempts to remove therapist's hands. Patient able to be redirected and performed functional ambulation ~120' back to room with modA overall. Patient left supine in enclosure bed to de-escalate, sitter present. RN notified of patient status.  Therapy Documentation Precautions:  Precautions Precautions: Fall Precaution Comments: left scapula and left clavicle fractures Required Braces or Orthoses: Sling Restrictions Weight Bearing Restrictions: Yes LUE Weight Bearing: Non weight bearing Other Position/Activity Restrictions: per ortho notes: AROM allowed for LT UE General: PT Amount of Missed Time (min): 10 Minutes PT Missed Treatment Reason: Patient fatigue;Increased agitation Pain: Pain Assessment Pain Assessment:  No/denies pain Pain Score: 0-No pain Locomotion : Ambulation Ambulation/Gait Assistance: 4: Min assist;3: Mod assist  Balance: Balance Balance Assessed: Yes Standardized Balance Assessment Standardized Balance Assessment: Berg Balance Test Berg Balance Test Sit to Stand: Able to stand  independently  using hands Standing Unsupported: Able to stand 2 minutes with supervision Sitting with Back Unsupported but Feet Supported on Floor or Stool: Able to sit safely and securely 2 minutes Stand to Sit: Uses backs of legs against chair to control descent Transfers: Needs one person to assist Standing Unsupported with Eyes Closed: Able to stand 10 seconds with supervision Standing Ubsupported with Feet Together: Needs help to attain position but able to stand for 30 seconds with feet together From Standing, Reach Forward with Outstretched Arm: Can reach forward >5 cm safely (2") From Standing Position, Pick up Object from Floor: Unable to try/needs assist to keep balance From Standing Position, Turn to Look Behind Over each Shoulder: Needs assist to keep from losing balance and falling Turn 360 Degrees: Needs assistance while turning Standing Unsupported, Alternately Place Feet on Step/Stool: Able to complete >2 steps/needs minimal assist Standing Unsupported, One Foot in Front: Loses balance while stepping or standing Standing on One Leg: Unable to try or needs assist to prevent fall Total Score: 20/56, indicating patient is at a high risk for falls (close to 100%)  See FIM for current functional status  Therapy/Group: Individual Therapy  Lillia Abed. Malon Branton, PT, DPT 05/15/2014, 4:51 PM
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
## 2014-05-15 NOTE — Progress Notes (Signed)
Occupational Therapy Weekly Progress Note  Patient Details  Name: Jose Krueger MRN: [id number] Date of Birth: [birth year]  Beginning of progress report period: May 08, 2014 End of progress repor period: May 15, 2014 t Today's Date: 05/15/2014 OT Individual Time: 1100-1200 OT Individual Time Calculation (min): 60 min    Patient has met 2 of 4 short term goals. Patient is making progressing in rehab this week, however, continues to fluctuate with participation, assistance, arousal, and behavior. Patient requires min-mod assist with functional transfers and mod-max cues for safety. Patient requires steady assist-total assist for self-care tasks. Patient's behavior is improving, however, continues to be inconsistent as he curses and refuses assistance/participation at times. Patient continues to have difficulty maintaining NWB precaution to LUE despite max-total cues. Patient is currently demonstrating behaviors consistent with Rancho Level V.  Patient continues to demonstrate the following deficits: decreased safety awareness, decreased attention, decreased strength, decreased coordination, decreased balance strategies, decreased ability to compensate for deficits, decreased adherence and knowledge of precautions, decreased memory, decreased awareness, decreased activity tolerance, decreased standing balance, overall impaired cognition and therefore will continue to benefit from skilled OT intervention to enhance overall performance with BADLs, safety awareness, ability to compensate for deficits, attention, and balance.  Patient progressing toward long term goals..  Continue plan of care.  OT Short Term Goals Week 2:  OT Short Term Goal 1 (Week 2): Pt will follow 1 step commands 50% of time with max multimodal cues OT Short Term Goal 1 - Progress (Week 2): Met OT Short Term Goal 2 (Week 2): Pt will consistently demonstrate sustained attention to functional activity 1 min  OT Short  Term Goal 2 - Progress (Week 2): Met OT Short Term Goal 3 (Week 2): Pt will be oriented x3 with max multimodal cues OT Short Term Goal 3 - Progress (Week 2): Not met OT Short Term Goal 4 (Week 2): Pt with stand during self-care task at supervision level for 30 seconds OT Short Term Goal 4 - Progress (Week 2): Not met  Skilled Therapeutic Interventions/Progress Updates:    Pt seen for ADL retraining with focus on attention, functional mobility, safety awareness, functional mobility, and cognitive remediation. Pt received in recliner chair agreeable to therapy session. Ambulated with min-mod assist to bathroom and max cues to prevent furniture walking. Engaged in bathing with pt sustaining to task approx 2 min with max cues. Pt required mod multimodal cues for sequencing and completion of task. Pt required min assist for standing balance during bathing and dressing. Pt required min cues for dressing as pt attended to task 5 min. Pt fatigued quickly during session, requiring multiple rest breaks. Pt completed 2 simple pipe tree puzzles with max cues and more then reasonable amount of time. Pt following 1 step commands to put pieces back in bucket with max cues 75% of time. Pt left sitting in recliner chair at nurses station.   Therapy Documentation Precautions:  Precautions Precautions: Fall Precaution Comments: left scapula and left clavicle fractures Required Braces or Orthoses: Sling Restrictions Weight Bearing Restrictions: Yes LUE Weight Bearing: Non weight bearing Other Position/Activity Restrictions: per ortho notes: AROM allowed for LT UE General:   Vital Signs:   Pain: Pain Assessment Pain Assessment: No/denies pain Pain Score: 0-No pain Faces Pain Scale: No hurt  See FIM for current functional status  Therapy/Group: Individual Therapy  Duayne Cal 05/15/2014, 10:57 AM

## 2014-05-16 ENCOUNTER — Encounter (HOSPITAL_COMMUNITY): Payer: Self-pay

## 2014-05-16 ENCOUNTER — Inpatient Hospital Stay (HOSPITAL_COMMUNITY): Payer: Medicaid Other | Admitting: Speech Pathology

## 2014-05-16 ENCOUNTER — Inpatient Hospital Stay (HOSPITAL_COMMUNITY): Payer: Medicaid Other

## 2014-05-16 ENCOUNTER — Inpatient Hospital Stay (HOSPITAL_COMMUNITY): Payer: Self-pay

## 2014-05-16 DIAGNOSIS — S2249XA Multiple fractures of ribs, unspecified side, initial encounter for closed fracture: Secondary | ICD-10-CM

## 2014-05-16 DIAGNOSIS — S069X9A Unspecified intracranial injury with loss of consciousness of unspecified duration, initial encounter: Secondary | ICD-10-CM

## 2014-05-16 DIAGNOSIS — F1010 Alcohol abuse, uncomplicated: Secondary | ICD-10-CM

## 2014-05-16 DIAGNOSIS — S42009A Fracture of unspecified part of unspecified clavicle, initial encounter for closed fracture: Secondary | ICD-10-CM

## 2014-05-16 DIAGNOSIS — S069XAA Unspecified intracranial injury with loss of consciousness status unknown, initial encounter: Secondary | ICD-10-CM
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
## 2014-05-16 NOTE — Progress Notes (Signed)
Physical Therapy Session Note  Patient Details  Name: Jose Krueger MRN: [id number] Date of Birth: 29-Jan-1956  Today's Date: 05/16/2014 PT Individual Time: 3729-0211 PT Individual Time Calculation (min): 30 min   Short Term Goals: Week 3:  PT Short Term Goal 1 (Week 3): STGs=LTGs  Skilled Therapeutic Interventions/Progress Updates:  Pt received supine in recliner, deeply asleep but aroused by touching his shouder and stating his name.  Pt oriented to day, month, location, situation.  Tx focused on locomotion with cognitive tasks.  Gait with HHA R hand and around waist, due to increased unsteadiness and narrow BOS.  Pt scissored with RLe at times due to over shifting to L.  Gait x 120' with mod assist, up/down 5 steps 2 rails with min assist, step through technique.  Pt transported small floor discs with L hand, to retrieve numbers 3-7 sequentially, from knee high locations throughout a room.  Pt scanned environment appropriately and attended to task for 100% accuracy while ambulating.  Pt remembered this unfamiliar therapist's name at end of session.  Pt transported back to room in recliner; passed off to NT.  Quick release belt on, and needs within reach.     Therapy Documentation Precautions:  Precautions Precautions: Fall Precaution Comments: left scapula and left clavicle fractures Required Braces or Orthoses: Sling Restrictions Weight Bearing Restrictions: Yes LUE Weight Bearing: Non weight bearing Other Position/Activity Restrictions: per ortho notes: AROM allowed for LT UE   Pain: Pain Assessment Pain Assessment: No/denies pain Faces Pain Scale: No hurt   Locomotion : Ambulation Ambulation/Gait Assistance: 3: Mod assist        See FIM for current functional status  Therapy/Group: Individual Therapy  Vedika Dumlao 05/16/2014, 9:40 AM

## 2014-05-16 NOTE — Progress Notes (Signed)
Physical Therapy Session Note  Patient Details  Name: Jose Krueger MRN: [id number] Date of Birth: 04/29/1956  Today's Date: 05/16/2014 PT Individual Time: 1115-1200 PT Individual Time Calculation (min): 45 min  Session 2 Time: 4098-1191 (co-tx w/ SLP Jose Krueger [id number]) Time Calculation (min): 60 min   Short Term Goals: Week 2:  PT Short Term Goal 1 (Week 2): STGs=LTGs  Skilled Therapeutic Interventions/Progress Updates:    Session 1: Pt received seated in recliner w/ sitter present, agreeable to participate in therapy. Session focused on standing static/dynamic balance, focused attention, orientation. Pt walked around rehab gym to find eight horse shoes at different heights/places that therapist had placed. Of note, pt able to consistently answer how many horse shoes he had picked and how many he had left with 100% accuracy. Additionally noted slightly decreased standing balance, requiring Min and occasional ModA for ambulation compared to past sessions w/ this therapist. Pt engaged in game of checkers while standing at therapy table w/ max cueing for rules, foot placement (pt tends to stand far away from target and reahc far, putting him at high risk for losing balance). Pt able to attend to game for 2-3 minutes before environment became louder and more distracting. Pt transported to ADL apartment in recliner and engaged in standing horseshoe toss game, no LOB noted while throwing horseshoes but required MinA to remain standing while picking horseshoes up off of floor. 2x8 tosses. Pt returned to room via recliner, left seated w/ QRB on and sitter present.  Session 2: Pt seen for co-tx w/ SLP Jose Krueger for shared goals. Pt engaged in scavenger hunt task looking for specific rooms on rehab unit in standing and while propelling recliner. Of note, pt's standing balance this session very impaired, leading therapist to use gait belt during ambulation for safety. Pt demonstrated frequent losses of  balance requiring ModA to correct. Pt with some subjective reports of dizziness, feeling like room is spinning. Engaged pt in therapeutic conversation and orientation questions. Pt oriented to city and type of building, able to recall name of hospital when prompted to think of hospitals in [place]. During room finding task pt able to recall task but required assist ranging from mod-max in order to scan environment to find room or indication (sign, arrow, etc) of direction of room. Pt emotionally labile at end of session after discussing his family, provided pt with comforting words. Pt left seated in recliner w/ QRB on w/ sitter present at end of session.  Therapy Documentation Precautions:  Precautions Precautions: Fall Precaution Comments: left scapula and left clavicle fractures Required Braces or Orthoses: Sling Restrictions Weight Bearing Restrictions: Yes LUE Weight Bearing: Non weight bearing Other Position/Activity Restrictions: per ortho notes: AROM allowed for LT UE General:   Vital Signs: Therapy Vitals Temp: 98.4 F (36.9 C) Temp src: Oral Pulse Rate: 85 Resp: 19 BP: 113/61 mmHg Patient Position (if appropriate): Lying Oxygen Therapy SpO2: 99 % O2 Device: None (Room air) Pain:   Mobility:   Locomotion :    Trunk/Postural Assessment :    Balance:   Exercises:   Other Treatments:    See FIM for current functional status  Therapy/Group: Individual Therapy  Jose Krueger Jose Krueger, PT, DPT 05/16/2014, 7:57 AM

## 2014-05-16 NOTE — Progress Notes (Addendum)
Occupational Therapy Session Note  Patient Details  Name: Jose Krueger MRN: [id number] Date of Birth: 1956/06/21  Today's Date: 05/16/2014 OT Individual Time: 1000-1100 OT Individual Time Calculation (min): 60 min    Short Term Goals: Week 3:  OT Short Term Goal 1 (Week 3): STGs=LTGs  Skilled Therapeutic Interventions/Progress Updates:    Pt engaged in BADL retraining including bathing at shower level and dressing with sit<>stand from chair.  Pt amb without AD (steady A) to gather clothing and take them into bathroom.  Pt required min verbal cues for thoroughness during shower but was able to bathe all body parts. Pt completed dressing tasks with steady A when standing but initiated all tasks when clothing items presented.  Pt gathered dirty clothes from floor and took them to the washer.  Pt amb to nursing station to get a coke before walking to therapy gym and engaged in assembling structures with PVC piping following picture. Pt assembled 3 structures with increasing difficulty. Pt required min verbal cues to return to room but did remember room number. Focus on task initiation, sequencing, attention to task, cognitive remediation, functional amb, dynamic standing balance, pathfinding, and safety awareness.  Therapy Documentation Precautions:  Precautions Precautions: Fall Precaution Comments: left scapula and left clavicle fractures Required Braces or Orthoses: Sling Restrictions Weight Bearing Restrictions: Yes LUE Weight Bearing: Non weight bearing Other Position/Activity Restrictions: per ortho notes: AROM allowed for LT UE   Pain: Pain Assessment Pain Assessment: Faces Faces Pain Scale: Hurts a little bit Pain Type: Acute pain Pain Location: Shoulder Pain Orientation: Left Pain Descriptors / Indicators: Sore Pain Onset: With Activity Pain Intervention(s): RN made aware  See FIM for current functional status  Therapy/Group: Individual Therapy  Leroy Libman 05/16/2014, 11:05 AM

## 2014-05-16 NOTE — Progress Notes (Signed)
Speech Language Pathology Weekly Progress and Session Note  Patient Details  Name: Jose Krueger MRN: [id number] Date of Birth: 1956-02-06  Beginning of progress report period: May 09, 2014 End of progress report period: May 16, 2014  Today's Date: 05/16/2014 SLP Individual Time: 1400-1415 SLP Individual Time Calculation (min): 15 min and Today's Date: 05/16/2014 SLP Co-Treatment Time: 1415 (Co-Tx with PT with JG: 1415-1445)-1430 SLP Co-Treatment Time Calculation (min): 15 min  Short Term Goals: Week 2: SLP Short Term Goal 1 (Week 2): Patient will demonstrate focused attention to a functioanl task for 30 seconds with Max A multimodal cues.  SLP Short Term Goal 1 - Progress (Week 2): Met SLP Short Term Goal 2 (Week 2): Patinet will initate functional tasks with Max A multimodal cues.  SLP Short Term Goal 2 - Progress (Week 2): Met SLP Short Term Goal 3 (Week 2): Patient will verbally express basic wants/needs with Max A multimodal cues.  SLP Short Term Goal 3 - Progress (Week 2): Met SLP Short Term Goal 4 (Week 2): Pt will consume current diet with Mod A mulitmodal cues for utilziation of swallowing compensatory strateiges to minimal overt s/s of aspiration  SLP Short Term Goal 4 - Progress (Week 2): Met    New Short Term Goals: Week 3: SLP Short Term Goal 1 (Week 3): Patient will demonstrate sustained attention to a functioanl task for 15  minutes with Mod A multimodal cues.  SLP Short Term Goal 2 (Week 3): Patient will orient to place, time and situation with Mod A multimodal cues.  SLP Short Term Goal 3 (Week 3): Patient will initiate functional tasks with Mod A mulimodal cues.  SLP Short Term Goal 4 (Week 3): Patient will verbalize wants/needs with Min A multimodal cues.  SLP Short Term Goal 5 (Week 3): Patient will identify 1 cognitive and 1 physical deficit with Max A multimodal cues.  SLP Short Term Goal 6 (Week 3): Patient will demonstrate efficient mastication with  trials of regular textures with Min A multimodal cues.   Weekly Progress Updates: Patient has made functional gains and has met 4 of 4 STG's this reporting period due to increased attention, initiation, orientation and verbal expression of wants/needs. Currently, patient demonstrates behaviors consistent with a Rancho level V-emerging VI and requires overall Max A to perform basic and familiar tasks safely and efficiently. Patient continues to demonstrate language of confusion with intermittent confabulation but is easily redirected. Patient is currently consuming Dys. 3 textures with thin liquids via cup with minimal overt s/s of aspiration but requires Mod A multimodal cues for use of swallowing compensatory strategies. Patient would benefit from continued skilled SLP intervention to maximize his cognitive-linguistic function and overall functional independence prior to discharge.   Intensity: Minumum of 1-2 x/day, 30 to 90 minutes Frequency: 5 out of 7 days Duration/Length of Stay: TBD due to SNF placement  Treatment/Interventions: Cueing hierarchy;Dysphagia/aspiration precaution training;Functional tasks;Internal/external aids;Environmental controls;Patient/family education;Cognitive remediation/compensation;Speech/Language facilitation;Therapeutic Activities   Daily Session Skilled Therapeutic Interventions: Skilled co-treatment session with PT focused on cognitive-linguistic goals and overall arousal. Upon arrival, the patient was upright watching television and demonstrated intermittent confabulation based off of information from current television program, however, patient was easily redirected and agreeable to participate in treatment session. SLP facilitated session by initially providing total A which faded to Mod-Max A for recall of previous therapy events and locations of therapy sessions in order to facilitate a path finding task. Patient also required total A for functional problem  solving  for utilizing external aids to increase efficient path navigation and Max A for selective attention to task in a moderately distracting environment. Approximately 25% of the task was performed while ambulating, however, due to fatigue and impaired standing balance effecting safety,the remaining of the task was completed with patient propelling a reclining chair with use of a rail and bilateral lower extremeties. Patient left with PT to complete session. Continue with current plan of care.  FIM:  Comprehension Comprehension Mode: Auditory Comprehension: 3-Understands basic 50 - 74% of the time/requires cueing 25 - 50%  of the time Expression Expression Mode: Verbal Expression: 3-Expresses basic 50 - 74% of the time/requires cueing 25 - 50% of the time. Needs to repeat parts of sentences. Social Interaction Social Interaction: 4-Interacts appropriately 75 - 89% of the time - Needs redirection for appropriate language or to initiate interaction. Problem Solving Problem Solving: 1-Solves basic less than 25% of the time - needs direction nearly all the time or does not effectively solve problems and may need a restraint for safety Memory Memory: 2-Recognizes or recalls 25 - 49% of the time/requires cueing 51 - 75% of the time FIM - Eating Eating Activity: 5: Needs verbal cues/supervision;5: Set-up assist for open containers Pain No/Denies Pain   Therapy/Group: Individual Therapy and Co-Treatment  Craven Crean, [place] 05/16/2014, 3:17 PM

## 2014-05-16 NOTE — Progress Notes (Signed)
[facility] PHYSICAL MEDICINE & REHABILITATION     PROGRESS NOTE    Subjective/Complaints: A little resltess last night apparently Objective: Vital Signs: Blood pressure 113/61, pulse 85, temperature 98.4 F (36.9 C), temperature source Oral, resp. rate 19, weight 76.34 kg (168 lb 4.8 oz), SpO2 99.00%. No results found. No results found for this basename: WBC, HGB, HCT, PLT,  in the last 72 hours No results found for this basename: NA, K, CL, CO, GLUCOSE, BUN, CREATININE, CALCIUM,  in the last 72 hours CBG (last 3)  No results found for this basename: GLUCAP,  in the last 72 hours  Wt Readings from Last 3 Encounters:  05/09/14 76.34 kg (168 lb 4.8 oz)  04/30/14 83.416 kg (183 lb 14.4 oz)  04/30/14 83.416 kg (183 lb 14.4 oz)    Physical Exam:  Nursing note and vitals reviewed.  Constitutional: He appears well-developed and well-nourished. Cervical collar and nasal cannula in place.  Needed sternal rubs to awaken briefly. Listing to the right in bed and resisting attempts at repositioning. BUE in mittens. Moaning occasionally.  HENT:  Head: Normocephalic.  Neck:  Supple. Central line in place right neck. Left clavicle incision with steri strips in place (no sling)  Cardiovascular: Regular rhythm.  Respiratory: Tachypnea noted. He has rhonchi.  Wet upper airway sounds. ronchi with occasional wheeze  GI: Soft. Bowel sounds are normal. He exhibits no distension.  Musculoskeletal: He exhibits no edema.  Skin: abrasions healing Neurological:  Remains   Confused. Limited eye contact. Does follow simple commands.    Assessment/Plan: 1. Functional deficits secondary to TBI, polytrauma which require 3+ hours per day of interdisciplinary therapy in a comprehensive inpatient rehab setting. Physiatrist is providing close team supervision and 24 hour management of active medical problems listed below. Physiatrist and rehab team continue to assess barriers to discharge/monitor patient  progress toward functional and medical goals.  FIM: FIM - Bathing Bathing Steps Patient Completed: Chest;Abdomen;Right Arm;Left Arm;Front perineal area;Buttocks;Right upper leg;Left upper leg Bathing: 4: Min-Patient completes 8-9 31f 10 parts or 75+ percent  FIM - Upper Body Dressing/Undressing Upper body dressing/undressing steps patient completed: Thread/unthread right sleeve of pullover shirt/dresss;Thread/unthread left sleeve of pullover shirt/dress;Put head through opening of pull over shirt/dress;Pull shirt over trunk Upper body dressing/undressing: 5: Set-up assist to: Obtain clothing/put away FIM - Lower Body Dressing/Undressing Lower body dressing/undressing steps patient completed: Thread/unthread right pants leg;Thread/unthread left pants leg;Pull pants up/down;Don/Doff right sock;Don/Doff left sock;Don/Doff right shoe;Fasten/unfasten right shoe;Don/Doff left shoe;Fasten/unfasten left shoe;Thread/unthread left underwear leg;Thread/unthread right underwear leg;Pull underwear up/down Lower body dressing/undressing: 4: Steadying Assist  FIM - Toileting Toileting steps completed by patient: Adjust clothing prior to toileting;Performs perineal hygiene;Adjust clothing after toileting Toileting Assistive Devices: Grab bar or rail for support Toileting: 4: Steadying assist  FIM - Radio producer Devices: Grab bars Toilet Transfers: 4-To toilet/BSC: Min A (steadying Pt. > 75%);4-From toilet/BSC: Min A (steadying Pt. > 75%)  FIM - Bed/Chair Transfer Bed/Chair Transfer Assistive Devices: Arm rests Bed/Chair Transfer: 4: Chair or W/C > Bed: Min A (steadying Pt. > 75%);4: Bed > Chair or W/C: Min A (steadying Pt. > 75%)  FIM - Locomotion: Wheelchair Locomotion: Wheelchair: 0: Activity did not occur FIM - Locomotion: Ambulation Locomotion: Ambulation Assistive Devices: Other (comment) (R HHA) Ambulation/Gait Assistance: 3: Mod assist Locomotion: Ambulation: 1:  Travels less than 50 ft with moderate assistance (Pt: 50 - 74%)  Comprehension Comprehension Mode: Auditory Comprehension: 3-Understands basic 50 - 74% of the time/requires cueing 25 -  50%  of the time  Expression Expression Mode: Verbal Expression: 3-Expresses basic 50 - 74% of the time/requires cueing 25 - 50% of the time. Needs to repeat parts of sentences.  Social Interaction Social Interaction: 3-Interacts appropriately 50 - 74% of the time - May be physically or verbally inappropriate.  Problem Solving Problem Solving: 1-Solves basic less than 25% of the time - needs direction nearly all the time or does not effectively solve problems and may need a restraint for safety  Memory Memory: 1-Recognizes or recalls less than 25% of the time/requires cueing greater than 75% of the time  Medical Problem List and Plan:  1. Functional deficits secondary to TBI  2. DVT Prophylaxis/Anticoagulation: Mechanical: Antiembolism stockings, thigh (TED hose) Bilateral lower extremities  Sequential compression devices, below knee Bilateral lower extremities  3. Pain Management: Monitor for outward signs of distress and medicate as appropriate.  4. Mood: Not able to determine at this time. H/o anxiety/depression/withdrawal with recommendation for outpatient treatment 02/2014. Will monitor as mentation improves. LCSW to follow along for evaluation/support when appropriate.  5. Neuropsych: This patient is not capable of making decisions on his own behalf.   -will move to LOW BED today  -continue ritalin for improved attention and initation  -tegretol for mood stabilization   -hs seroquel--added daytime dosing to assist with agitation 6. Skin/Wound Care: wounds healing 7. Advanced Cirrhosis of liver/Liver mass?: Continue lactulose tid. ammonia levels pending  -MRI 12/2010 with two small suspicious liver lesions with recommendations for follow up at that time. (Had seen Dr. Benson Norway 2012 [place])  8. Left  clavicle fracture: NWB LUE. Sling for support as able.  9. ABLA:    10. Activation:  added ritalin 11. Vitamin B 12 deficiency  LOS (Days) 16 A FACE TO FACE EVALUATION WAS PERFORMED  Cecely Rengel T 05/16/2014 8:16 AM

## 2014-05-17 ENCOUNTER — Inpatient Hospital Stay (HOSPITAL_COMMUNITY): Payer: Medicaid Other | Admitting: Occupational Therapy

## 2014-05-17 ENCOUNTER — Inpatient Hospital Stay (HOSPITAL_COMMUNITY): Payer: Medicaid Other | Admitting: Speech Pathology

## 2014-05-17 ENCOUNTER — Inpatient Hospital Stay (HOSPITAL_COMMUNITY): Payer: Medicaid Other

## 2014-05-17 ENCOUNTER — Inpatient Hospital Stay (HOSPITAL_COMMUNITY): Payer: Medicaid Other | Admitting: *Deleted

## 2014-05-17 MED ORDER — LACTULOSE 10 GM/15ML PO SOLN
45.0000 g | Freq: Every day | ORAL | Status: DC
  Filled 2014-05-17 (×2): qty 90

## 2014-05-17 NOTE — Progress Notes (Signed)
Occupational Therapy Session Note  Patient Details  Name: Jose Krueger MRN: [id number] Date of Birth: Jun 20, 1956  Today's Date: 05/17/2014 OT Individual Time: 6387-5643 OT Individual Time Calculation (min): 45 min    Short Term Goals: Week 3:  OT Short Term Goal 1 (Week 3): STGs=LTGs  Skilled Therapeutic Interventions/Progress Updates:  Upon entering the room, pt seated in recliner chair with family present in room. Pt agrees to participation in this session. Pt transported to day room via recliner chair this session. Pt is oriented to self, location, and day of the week. Pt is not oriented to year or date. Pt engaged in 4 rounds of connect four. Pt stating, "I have played this game before." He verbalized instructions/directions for rules of game with 100% accuracy. Pt in room with slight environmental distractions such as an occasional person walking by or talking. Pt able to attend to task well until ~ 5 turns have been taken. Pt then requiring min verbal cues to attend to task and needed increased time for strategy and to take turn. Therapist and pt then engaging in game of UNO which pt is also familiar with. Pt did well attending to task for ~ 2- 3 minutes with increased time. As game progressed pt required mod - max questioning cues to draw a card or to put down a certain color or number card. Pt never required cues for turn taking during session. Session with focus on cognitive remediation and attention. Pt able to remember this unknown/new therapist name at end of session. Pt returned to room with sitter present and quick release belt applied while pt reclined in recliner chair.   Therapy Documentation Precautions:  Precautions Precautions: Fall Precaution Comments: left scapula and left clavicle fractures Required Braces or Orthoses: Sling Restrictions Weight Bearing Restrictions: Yes LUE Weight Bearing: Non weight bearing Other Position/Activity Restrictions: per ortho notes: AROM  allowed for LT UE Pain: Pain Assessment Pain Assessment: No/denies pain  See FIM for current functional status  Therapy/Group: Individual Therapy  Phineas Semen 05/17/2014, 2:43 PM

## 2014-05-17 NOTE — Progress Notes (Signed)
[facility] PHYSICAL MEDICINE & REHABILITATION     PROGRESS NOTE    Subjective/Complaints: In low bed. Calm/somewhat cooperative  Objective: Vital Signs: Blood pressure 156/92, pulse 86, temperature 98.2 F (36.8 C), temperature source Oral, resp. rate 18, weight 77.565 kg (171 lb), SpO2 100.00%. No results found. No results found for this basename: WBC, HGB, HCT, PLT,  in the last 72 hours No results found for this basename: NA, K, CL, CO, GLUCOSE, BUN, CREATININE, CALCIUM,  in the last 72 hours CBG (last 3)  No results found for this basename: GLUCAP,  in the last 72 hours  Wt Readings from Last 3 Encounters:  05/17/14 77.565 kg (171 lb)  04/30/14 83.416 kg (183 lb 14.4 oz)  04/30/14 83.416 kg (183 lb 14.4 oz)    Physical Exam:  Nursing note and vitals reviewed.  Constitutional: He appears well-developed and well-nourished. Cervical collar and nasal cannula in place.  Needed sternal rubs to awaken briefly. Listing to the right in bed and resisting attempts at repositioning. BUE in mittens. Moaning occasionally.  HENT:  Head: Normocephalic.  Neck:  Supple. Central line in place right neck. Left clavicle incision with steri strips in place (no sling)  Cardiovascular: Regular rhythm.  Respiratory: Tachypnea noted. He has rhonchi.  Wet upper airway sounds. ronchi with occasional wheeze  GI: Soft. Bowel sounds are normal. He exhibits no distension.  Musculoskeletal: He exhibits no edema.  Skin: abrasions healing Neurological:  Remains   Confused. Limited eye contact. Does follow simple commands.    Assessment/Plan: 1. Functional deficits secondary to TBI, polytrauma which require 3+ hours per day of interdisciplinary therapy in a comprehensive inpatient rehab setting. Physiatrist is providing close team supervision and 24 hour management of active medical problems listed below. Physiatrist and rehab team continue to assess barriers to discharge/monitor patient progress  toward functional and medical goals.  FIM: FIM - Bathing Bathing Steps Patient Completed: Chest;Abdomen;Right Arm;Left Arm;Front perineal area;Buttocks;Right upper leg;Left upper leg;Right lower leg (including foot);Left lower leg (including foot) Bathing: 5: Supervision: Safety issues/verbal cues  FIM - Upper Body Dressing/Undressing Upper body dressing/undressing steps patient completed: Thread/unthread right sleeve of pullover shirt/dresss;Thread/unthread left sleeve of pullover shirt/dress;Put head through opening of pull over shirt/dress;Pull shirt over trunk Upper body dressing/undressing: 5: Set-up assist to: Obtain clothing/put away FIM - Lower Body Dressing/Undressing Lower body dressing/undressing steps patient completed: Thread/unthread right pants leg;Thread/unthread left pants leg;Pull pants up/down;Don/Doff right sock;Don/Doff left sock;Don/Doff right shoe;Fasten/unfasten right shoe;Don/Doff left shoe;Fasten/unfasten left shoe;Thread/unthread left underwear leg;Thread/unthread right underwear leg;Pull underwear up/down Lower body dressing/undressing: 4: Steadying Assist  FIM - Toileting Toileting steps completed by patient: Adjust clothing prior to toileting;Performs perineal hygiene;Adjust clothing after toileting Toileting Assistive Devices: Grab bar or rail for support Toileting: 4: Steadying assist  FIM - Radio producer Devices: Grab bars Toilet Transfers: 4-To toilet/BSC: Min A (steadying Pt. > 75%);4-From toilet/BSC: Min A (steadying Pt. > 75%)  FIM - Bed/Chair Transfer Bed/Chair Transfer Assistive Devices: Arm rests Bed/Chair Transfer: 4: Bed > Chair or W/C: Min A (steadying Pt. > 75%);4: Chair or W/C > Bed: Min A (steadying Pt. > 75%)  FIM - Locomotion: Wheelchair Locomotion: Wheelchair: 0: Activity did not occur FIM - Locomotion: Ambulation Locomotion: Ambulation Assistive Devices: Other (comment) (R HHA and around  waist) Ambulation/Gait Assistance: 3: Mod assist Locomotion: Ambulation: 3: Travels 150 ft or more with moderate assistance (Pt: 50 - 74%)  Comprehension Comprehension Mode: Auditory Comprehension: 3-Understands basic 50 - 74% of the time/requires cueing  25 - 50%  of the time  Expression Expression Mode: Verbal Expression: 3-Expresses basic 50 - 74% of the time/requires cueing 25 - 50% of the time. Needs to repeat parts of sentences.  Social Interaction Social Interaction: 5-Interacts appropriately 90% of the time - Needs monitoring or encouragement for participation or interaction.  Problem Solving Problem Solving: 1-Solves basic less than 25% of the time - needs direction nearly all the time or does not effectively solve problems and may need a restraint for safety  Memory Memory: 3-Recognizes or recalls 50 - 74% of the time/requires cueing 25 - 49% of the time  Medical Problem List and Plan:  1. Functional deficits secondary to TBI  2. DVT Prophylaxis/Anticoagulation: Mechanical: Antiembolism stockings, thigh (TED hose) Bilateral lower extremities  Sequential compression devices, below knee Bilateral lower extremities  3. Pain Management: Monitor for outward signs of distress and medicate as appropriate.  4. Mood: Not able to determine at this time. H/o anxiety/depression/withdrawal with recommendation for outpatient treatment 02/2014. Will monitor as mentation improves. LCSW to follow along for evaluation/support when appropriate.  5. Neuropsych: This patient is not capable of making decisions on his own behalf.   -will move to LOW BED today  -continue ritalin for improved attention and initation  -tegretol for mood stabilization   -hs seroquel--added daytime dosing to assist with agitation 6. Skin/Wound Care: wounds healing 7. Advanced Cirrhosis of liver/Liver mass?: Continue lactulose tid. ammonia levels pending  -MRI 12/2010 with two small suspicious liver lesions with  recommendations for follow up at that time. (Had seen Dr. Benson Norway 2012 [place])  8. Left clavicle fracture: NWB LUE. Sling for support as able.  9. ABLA:    10. Activation:  added ritalin 11. Vitamin B 12 deficiency  LOS (Days) 17 A FACE TO FACE EVALUATION WAS PERFORMED  SWARTZ,ZACHARY T 05/17/2014 8:45 AM

## 2014-05-17 NOTE — Progress Notes (Signed)
Speech Language Pathology Daily Session Note  Patient Details  Name: Jose Krueger MRN: [id number] Date of Birth: 1956-06-09  Today's Date: 05/17/2014 SLP Individual Time: 9458-5929 SLP Individual Time Calculation (min): 45 min  Short Term Goals: Week 3: SLP Short Term Goal 1 (Week 3): Patient will demonstrate sustained attention to a functioanl task for 15  minutes with Mod A multimodal cues.  SLP Short Term Goal 2 (Week 3): Patient will orient to place, time and situation with Mod A multimodal cues.  SLP Short Term Goal 3 (Week 3): Patient will initiate functional tasks with Mod A mulimodal cues.  SLP Short Term Goal 4 (Week 3): Patient will verbalize wants/needs with Min A multimodal cues.  SLP Short Term Goal 5 (Week 3): Patient will identify 1 cognitive and 1 physical deficit with Max A multimodal cues.  SLP Short Term Goal 6 (Week 3): Patient will demonstrate efficient mastication with trials of regular textures with Min A multimodal cues.   Skilled Therapeutic Interventions: Skilled treatment session focused on cognitive-linguistic goals. Student facilitated session by providing Mod A multimodal cues for initiation and utilization of problem solving during basic categorizing and functional naming tasks. Patient demonstrated language of confusion and verbal perseveration when alternating tasks and required Mod-Max A for redirection. Patient independently requested to use the bathroom and required Mod A for safety and problem solving with self-care tasks. Patient left in recliner at RN station with quick release belt in place. Continue with current plan of care.    FIM:  Comprehension Comprehension Mode: Auditory Comprehension: 3-Understands basic 50 - 74% of the time/requires cueing 25 - 50%  of the time Expression Expression Mode: Verbal Expression: 3-Expresses basic 50 - 74% of the time/requires cueing 25 - 50% of the time. Needs to repeat parts of sentences. Social  Interaction Social Interaction: 4-Interacts appropriately 75 - 89% of the time - Needs redirection for appropriate language or to initiate interaction. Problem Solving Problem Solving: 2-Solves basic 25 - 49% of the time - needs direction more than half the time to initiate, plan or complete simple activities Memory Memory: 3-Recognizes or recalls 50 - 74% of the time/requires cueing 25 - 49% of the time  Pain Pain Assessment Pain Assessment: No/denies pain  Therapy/Group: Individual Therapy  Rich Paprocki 05/17/2014, 4:34 PM

## 2014-05-17 NOTE — Progress Notes (Signed)
Social Work Patient ID: Jose Krueger, male   DOB: 1955/12/19, 58 y.o.   MRN: [id number]   Have reviewed team conference with pt's girlfriend and have stressed that he still requires 24/7 constant assistance.  I am still pursuing SNF placement for pt with Medicaid pending.  Out of enclosure bed now and appears to be doing okay.  FL2 is out, however, only denials at this point.  Continue to follow.  Jahred Tatar, LCSW

## 2014-05-17 NOTE — Progress Notes (Signed)
The skilled treatment note has been reviewed and SLP is in agreement.  Jasiel Belisle, M.A., CCC-SLP  [phone]   

## 2014-05-17 NOTE — Progress Notes (Signed)
Physical Therapy Session Note  Patient Details  Name: Jose Krueger MRN: [id number] Date of Birth: Dec 29, 1955  Today's Date: 05/17/2014 PT Individual Time: 1000-1100 PT Individual Time Calculation (min): 60 min   Short Term Goals: Week 3:  PT Short Term Goal 1 (Week 3): STGs=LTGs  Skilled Therapeutic Interventions/Progress Updates:    Patient received semi-reclined in bed. Session focused on functional mobility and cognitive remediation. Patient oriented to person, place, and situation; disoriented to month and year. When asked, patient reports need to use bathroom, ambulation with min-modA to bathroom due to narrow BOS, intermittent scissoring, and overall unsteadiness. Patient reports he does not need to use bathroom once he is there. Patient transported to gym via recliner. Sitting EOM, patient completed two pipe tree diagrams of easy then medium difficulty. Patient requires increased time and mod-max questioning cues for accuracy with pieces and sequencing of construction. Patient requires 45 min to complete 3 designs. Patient left sitting in recliner with seatbelt donned at RN station.  Therapy Documentation Precautions:  Precautions Precautions: Fall Precaution Comments: left scapula and left clavicle fractures Required Braces or Orthoses: Sling Restrictions Weight Bearing Restrictions: Yes LUE Weight Bearing: Non weight bearing Other Position/Activity Restrictions: per ortho notes: AROM allowed for LT UE Pain: Pain Assessment Pain Assessment: No/denies pain Pain Score: 0-No pain Locomotion : Ambulation Ambulation/Gait Assistance: 3: Mod assist   See FIM for current functional status  Therapy/Group: Individual Therapy  Lillia Abed. Jaxtyn Linville, PT, DPT 05/17/2014, 12:13 PM

## 2014-05-17 NOTE — Progress Notes (Signed)
Occupational Therapy Session Note  Patient Details  Name: KEANO GUGGENHEIM MRN: [id number] Date of Birth: November 01, 1955  Today's Date: 05/17/2014 OT Individual Time: 1100-1200 OT Individual Time Calculation (min): 60 min    Short Term Goals: Week 3:  OT Short Term Goal 1 (Week 3): STGs=LTGs  Skilled Therapeutic Interventions/Progress Updates:    Pt engaged in BADL retraining including bathing at shower level and dressing with sit<>stand from chair.  Pt amb with HHA to bathroom to use toilet before transferring to shower.  Pt required min verbal cues for thoroughness and task initiation.  Pt completed dressing with sit<>stand from chair requiring steady A.  Pt initiated tasks when presented with clothing items.  Pt amb with HHA to laundry room to wash clothing.  Pt required tot A for orientation to place but accurately reported situation. Focus on orientation, activity tolerance, safety awareness, functional amb with HHA, sequencing, and cognitive remediation.   Therapy Documentation Precautions:  Precautions Precautions: Fall Precaution Comments: left scapula and left clavicle fractures Required Braces or Orthoses: Sling Restrictions Weight Bearing Restrictions: Yes LUE Weight Bearing: Non weight bearing Other Position/Activity Restrictions: per ortho notes: AROM allowed for LT UE    Pain: Pain Assessment Pain Assessment: No/denies pain Pain Score: 0-No pain  See FIM for current functional status  Therapy/Group: Individual Therapy  Leroy Libman 05/17/2014, 12:10 PM

## 2014-05-18 ENCOUNTER — Inpatient Hospital Stay (HOSPITAL_COMMUNITY): Payer: Medicaid Other | Admitting: Occupational Therapy

## 2014-05-18 ENCOUNTER — Inpatient Hospital Stay (HOSPITAL_COMMUNITY): Payer: Medicaid Other

## 2014-05-18 ENCOUNTER — Inpatient Hospital Stay (HOSPITAL_COMMUNITY): Payer: Medicaid Other | Admitting: *Deleted

## 2014-05-18 ENCOUNTER — Inpatient Hospital Stay (HOSPITAL_COMMUNITY): Payer: Medicaid Other | Admitting: Speech Pathology

## 2014-05-18 LAB — AMMONIA: AMMONIA: 86 umol/L — AB (ref 11–60)

## 2014-05-18 MED ORDER — LACTULOSE 10 GM/15ML PO SOLN
45.0000 g | Freq: Three times a day (TID) | ORAL | Status: DC
  Administered 2014-05-18 – 2014-05-19 (×4): 45 g via ORAL
  Filled 2014-05-18 (×7): qty 90

## 2014-05-18 NOTE — Progress Notes (Signed)
Occupational Therapy Session Note  Patient Details  Name: Jose Krueger MRN: [id number] Date of Birth: 01-10-1956  Today's Date: 05/18/2014 OT Individual Time: 1000-1100 OT Individual Time Calculation (min): 60 min    Short Term Goals: Week 1:  OT Short Term Goal 1 (Week 1): Patient will demonstrate focused attention to self-care task for 10 seconds OT Short Term Goal 1 - Progress (Week 1): Met OT Short Term Goal 2 (Week 1): Patient will wash 5/10 body parts with mod cues   OT Short Term Goal 2 - Progress (Week 1): Not met OT Short Term Goal 3 (Week 1): Patient will follow 1 step commands 50% of time during functional activity  OT Short Term Goal 3 - Progress (Week 1): Not met OT Short Term Goal 4 (Week 1): Patient will complete functional transfers with max assist  OT Short Term Goal 4 - Progress (Week 1): Met  Skilled Therapeutic Interventions/Progress Updates:    Pt engaged in BADL retraining including bathing at shower level and dressing with sit<>stand from chair.  Pt required min A for amb to bathroom.  Pt exhibited increased ataxic movements during ambulation and standing this morning and required min A when standing during shower and when donning pants.  Pt completed all bathing and dressing tasks when presented with supplies and clothing.  Pt amb with HHA to nurses station to obtain a soda and returned to room.  When asked what year this was patient stated [birth year] (his birth year). When asked what year he was born in patient stated 2017.  Pt was able to state he was in a hospital and what city the hospital was in as well as why he was in hospital.  Pt remained in room with sitter present.  Focus on activity tolerance, functional amb with HHA, standing balance, task initiation, attention to task, cognitive remediation, and safety awareness.  Therapy Documentation Precautions:  Precautions Precautions: Fall Precaution Comments: left scapula and left clavicle fractures Required Braces  or Orthoses: Sling Restrictions Weight Bearing Restrictions: Yes LUE Weight Bearing: Non weight bearing Other Position/Activity Restrictions: per ortho notes: AROM allowed for LT UE General:   Vital Signs:   Pain: Pain Assessment Pain Assessment: No/denies pain  See FIM for current functional status  Therapy/Group: Individual Therapy  Leroy Libman 05/18/2014, 11:03 AM

## 2014-05-18 NOTE — Progress Notes (Signed)
[place] PHYSICAL MEDICINE & REHABILITATION     PROGRESS NOTE    Subjective/Complaints: Ino new issues  Objective: Vital Signs: Blood pressure 112/69, pulse 84, temperature 98.5 F (36.9 C), temperature source Oral, resp. rate 19, weight 77.565 kg (171 lb), SpO2 99.00%. No results found. No results found for this basename: WBC, HGB, HCT, PLT,  in the last 72 hours No results found for this basename: NA, K, CL, CO, GLUCOSE, BUN, CREATININE, CALCIUM,  in the last 72 hours CBG (last 3)  No results found for this basename: GLUCAP,  in the last 72 hours  Wt Readings from Last 3 Encounters:  05/17/14 77.565 kg (171 lb)  04/30/14 83.416 kg (183 lb 14.4 oz)  04/30/14 83.416 kg (183 lb 14.4 oz)    Physical Exam:  Nursing note and vitals reviewed.  Constitutional: He appears well-developed and well-nourished. Cervical collar and nasal cannula in place.  Needed sternal rubs to awaken briefly. Listing to the right in bed and resisting attempts at repositioning. BUE in mittens. Moaning occasionally.  HENT:  Head: Normocephalic.  Neck:  Supple. Central line in place right neck. Left clavicle incision with steri strips in place (no sling)  Cardiovascular: Regular rhythm.  Respiratory: Tachypnea noted. He has rhonchi.  Wet upper airway sounds. ronchi with occasional wheeze  GI: Soft. Bowel sounds are normal. He exhibits no distension.  Musculoskeletal: He exhibits no edema.  Skin: abrasions healing Neurological:  Remains   Confused. Limited eye contact. Does follow simple commands.    Assessment/Plan: 1. Functional deficits secondary to TBI, polytrauma which require 3+ hours per day of interdisciplinary therapy in a comprehensive inpatient rehab setting. Physiatrist is providing close team supervision and 24 hour management of active medical problems listed below. Physiatrist and rehab team continue to assess barriers to discharge/monitor patient progress toward functional and  medical goals.  FIM: FIM - Bathing Bathing Steps Patient Completed: Chest;Abdomen;Right Arm;Left Arm;Front perineal area;Buttocks;Right upper leg;Left upper leg;Right lower leg (including foot);Left lower leg (including foot) Bathing: 4: Steadying assist  FIM - Upper Body Dressing/Undressing Upper body dressing/undressing steps patient completed: Thread/unthread right sleeve of pullover shirt/dresss;Thread/unthread left sleeve of pullover shirt/dress;Put head through opening of pull over shirt/dress;Pull shirt over trunk Upper body dressing/undressing: 5: Set-up assist to: Obtain clothing/put away FIM - Lower Body Dressing/Undressing Lower body dressing/undressing steps patient completed: Thread/unthread right pants leg;Thread/unthread left pants leg;Pull pants up/down;Don/Doff right sock;Don/Doff left sock;Don/Doff right shoe;Fasten/unfasten right shoe;Don/Doff left shoe;Fasten/unfasten left shoe;Thread/unthread left underwear leg;Thread/unthread right underwear leg;Pull underwear up/down Lower body dressing/undressing: 4: Steadying Assist  FIM - Toileting Toileting steps completed by patient: Adjust clothing prior to toileting;Performs perineal hygiene;Adjust clothing after toileting Toileting Assistive Devices: Grab bar or rail for support Toileting: 4: Steadying assist  FIM - Radio producer Devices: Grab bars Toilet Transfers: 4-To toilet/BSC: Min A (steadying Pt. > 75%);4-From toilet/BSC: Min A (steadying Pt. > 75%)  FIM - Bed/Chair Transfer Bed/Chair Transfer Assistive Devices: Arm rests Bed/Chair Transfer: 4: Bed > Chair or W/C: Min A (steadying Pt. > 75%);4: Chair or W/C > Bed: Min A (steadying Pt. > 75%)  FIM - Locomotion: Wheelchair Locomotion: Wheelchair: 0: Activity did not occur FIM - Locomotion: Ambulation Locomotion: Ambulation Assistive Devices: Other (comment) (R HHA) Ambulation/Gait Assistance: 3: Mod assist Locomotion: Ambulation: 1:  Travels less than 50 ft with moderate assistance (Pt: 50 - 74%)  Comprehension Comprehension Mode: Auditory Comprehension: 3-Understands basic 50 - 74% of the time/requires cueing 25 - 50%  of the time  Expression Expression Mode: Verbal Expression: 3-Expresses basic 50 - 74% of the time/requires cueing 25 - 50% of the time. Needs to repeat parts of sentences.  Social Interaction Social Interaction: 4-Interacts appropriately 75 - 89% of the time - Needs redirection for appropriate language or to initiate interaction.  Problem Solving Problem Solving: 2-Solves basic 25 - 49% of the time - needs direction more than half the time to initiate, plan or complete simple activities  Memory Memory: 3-Recognizes or recalls 50 - 74% of the time/requires cueing 25 - 49% of the time  Medical Problem List and Plan:  1. Functional deficits secondary to TBI  2. DVT Prophylaxis/Anticoagulation: Mechanical: Antiembolism stockings, thigh (TED hose) Bilateral lower extremities  Sequential compression devices, below knee Bilateral lower extremities  3. Pain Management: Monitor for outward signs of distress and medicate as appropriate.  4. Mood: Not able to determine at this time. H/o anxiety/depression/withdrawal with recommendation for outpatient treatment 02/2014. Will monitor as mentation improves. LCSW to follow along for evaluation/support when appropriate.  5. Neuropsych: This patient is not capable of making decisions on his own behalf.   -will move to LOW BED today  -continue ritalin for improved attention and initation  -tegretol for mood stabilization   -bid seroquel 6. Skin/Wound Care: wounds healing 7. Advanced Cirrhosis of liver/Liver mass?: Continue lactulose tid. ammonia levels pending  -MRI 12/2010 with two small suspicious liver lesions with recommendations for follow up at that time. (Had seen Dr. Benson [place] 2012 [place])  8. Left clavicle fracture: NWB LUE. Sling for support as able.  9.  ABLA:    10. Activation:  added ritalin 11. Vitamin B 12 deficiency  LOS (Days) 18 A FACE TO FACE EVALUATION WAS PERFORMED  Jose Krueger T 05/18/2014 8:29 AM

## 2014-05-18 NOTE — Progress Notes (Signed)
Occupational Therapy Session Note  Patient Details  Name: KAI CALICO MRN: [id number] Date of Birth: Jun 21, 1956  Today's Date: 05/18/2014 OT Individual Time: 1300-1310 OT Individual Time Calculation (min): 10 min    Short Term Goals: Week 3:  OT Short Term Goal 1 (Week 3): STGs=LTGs  Skilled Therapeutic Interventions/Progress Updates:  Upon entering the room, pt supine in bed sleeping with sitter present in the room. Pt became very agitated when awoken. OT attempting to coax pt into participating but pt stating, " I am not going anywhere with you." OT educated pt on goals and activities that pt could engage in with therapist and pt continues to state, "I don't like you. I am not going anywhere with you." OT notified male OTA whom pt has report with in order to encourage therapy participation. Please see Clyda Greener note for details regarding remainder of session.   Therapy Documentation Precautions:  Precautions Precautions: Fall Precaution Comments: left scapula and left clavicle fractures Required Braces or Orthoses: Sling Restrictions Weight Bearing Restrictions: Yes LUE Weight Bearing: Non weight bearing Other Position/Activity Restrictions: per ortho notes: AROM allowed for LT UE General: General PT Missed Treatment Reason: Patient fatigue  See FIM for current functional status  Therapy/Group: Individual Therapy  Phineas Semen 05/18/2014, 3:45 PM

## 2014-05-18 NOTE — Progress Notes (Signed)
Speech Language Pathology Daily Session Note  Patient Details  Name: Jose Krueger MRN: [id number] Date of Birth: Dec 08, 1955  Today's Date: 05/18/2014 SLP Individual Time: 1100-1145 SLP Individual Time Calculation (min): 45 min  Short Term Goals: Week 3: SLP Short Term Goal 1 (Week 3): Patient will demonstrate sustained attention to a functioanl task for 15  minutes with Mod A multimodal cues.  SLP Short Term Goal 2 (Week 3): Patient will orient to place, time and situation with Mod A multimodal cues.  SLP Short Term Goal 3 (Week 3): Patient will initiate functional tasks with Mod A mulimodal cues.  SLP Short Term Goal 4 (Week 3): Patient will verbalize wants/needs with Min A multimodal cues.  SLP Short Term Goal 5 (Week 3): Patient will identify 1 cognitive and 1 physical deficit with Max A multimodal cues.  SLP Short Term Goal 6 (Week 3): Patient will demonstrate efficient mastication with trials of regular textures with Min A multimodal cues.   Skilled Therapeutic Interventions: Skilled treatment session focused on cognitive-linguistic goals. SLP facilitated session by providing Max A multimodal cues for orientation to place, time and situation for recall of information after a delay, however, patient required Min-Mod A multimodal cues for intellectual awareness of his cognitive and physical deficits. Patient continues to demonstrate intermittent language of confusion but is easily redirected. Patient provided verbal instruction of rules to a mildly complex but familiar task to this clinician with Min-Mod A multimodal cues and demonstrated selective attention to task for ~20 minutes with Min A verbal cues. Patient was sitting on the edge of the recliner with tasks throughout the session and appeared to demonstrate increased ataxia with movements. Patient left in room with sitter present. Continue with current plan of care.    FIM:  Comprehension Comprehension Mode: Auditory Comprehension:  3-Understands basic 50 - 74% of the time/requires cueing 25 - 50%  of the time Expression Expression Mode: Verbal Expression: 3-Expresses basic 50 - 74% of the time/requires cueing 25 - 50% of the time. Needs to repeat parts of sentences. Social Interaction Social Interaction: 4-Interacts appropriately 75 - 89% of the time - Needs redirection for appropriate language or to initiate interaction. Problem Solving Problem Solving: 2-Solves basic 25 - 49% of the time - needs direction more than half the time to initiate, plan or complete simple activities Memory Memory: 3-Recognizes or recalls 50 - 74% of the time/requires cueing 25 - 49% of the time FIM - Eating Eating Activity: 5: Needs verbal cues/supervision;5: Set-up assist for open containers  Pain Pain Assessment Pain Assessment: No/denies pain  Therapy/Group: Individual Therapy  Samatha Anspach, [place] 05/18/2014, 12:19 PM

## 2014-05-18 NOTE — Progress Notes (Signed)
Physical Therapy Session Note  Patient Details  Name: Jose Krueger MRN: [id number] Date of Birth: 13-Jan-1956  Today's Date: 05/18/2014 PT Individual Time: 1400-1450 PT Individual Time Calculation (min): 50 min   Short Term Goals: Week 3:  PT Short Term Goal 1 (Week 3): STGs=LTGs  Skilled Therapeutic Interventions/Progress Updates:    Patient received sittnig in recliner. Session focused on functional mobility and NMR. Upon standing, patient demonstrating truncal ataxia which continues during gait training R >L LE with ataxic movements. Throughout session, several sit<>stand transfers with gross ataxia in trunk and L UE/LE, increasing falls risk and patient requiring increased assistance due to it. Gait training 125' x1 with modA overall, requires up to [place] when attempting gait training at end of session after only 10'. Several transitional movements, sit>stand, stand pivot, standing>tall kneeling, each eliciting gross ataxic movements in trunk and extremities, appearing almost seizure-like. Vitals taken: BP: 142/62, HR: 97, SpO2: 99%. Patient without complaints other than fatigue. Patient left sitting in recliner with seatbelt donned at RN station. PA aware of patient status/change.  Therapy Documentation Precautions:  Precautions Precautions: Fall Precaution Comments: left scapula and left clavicle fractures Required Braces or Orthoses: Sling Restrictions Weight Bearing Restrictions: Yes LUE Weight Bearing: Non weight bearing Other Position/Activity Restrictions: per ortho notes: AROM allowed for LT UE General: PT Amount of Missed Time (min): 10 Minutes PT Missed Treatment Reason: Patient fatigue Pain: Pain Assessment Pain Assessment: 0-10 Pain Score: 6  Pain Type: Acute pain Pain Location: Shoulder Pain Orientation: Left Pain Descriptors / Indicators: Aching;Sore Pain Onset: On-going Pain Intervention(s): RN made aware;Repositioned;Ambulation/increased activity Multiple  Pain Sites: No Locomotion : Ambulation Ambulation/Gait Assistance: 3: Mod assist;1: +1 Total assist   See FIM for current functional status  Therapy/Group: Individual Therapy  Lillia Abed. Keir Viernes, PT, DPT 05/18/2014, 3:36 PM

## 2014-05-18 NOTE — Progress Notes (Signed)
Occupational Therapy Note  Patient Details  Name: Jose Krueger MRN: [id number] Date of Birth: Aug 01, 1956  Today's Date: 05/18/2014 OT Individual Time: 1310-1345 OT Individual Time Calculation (min): 35 min   Indivual Therapy Pt denied pain  Pt resting in bed upon arrival.  Pt stated he didn't want to "work with the other therapist" but was agreeable to getting OOB to participate with this therapist.  Pt engaged in 3 games of Connect 4 (winning 2) and 3 games of War.  Pt was able to explain to therapist the objective of Connect 4 and game of War after described by therapist.  Pt took turns and made appropriate moves, etc. Throughout session.  Pt stated several time that he didn't "like the other lady." Focus on cognitive remediation including task initiation, STM, attention to task, active participation, and activity tolerance.  Pt returned to room with sitter present.   Leotis Shames [facility] 05/18/2014, 2:40 PM

## 2014-05-19 ENCOUNTER — Inpatient Hospital Stay (HOSPITAL_COMMUNITY): Payer: Self-pay | Admitting: *Deleted

## 2014-05-19 DIAGNOSIS — S42009A Fracture of unspecified part of unspecified clavicle, initial encounter for closed fracture: Secondary | ICD-10-CM

## 2014-05-19 DIAGNOSIS — S2249XA Multiple fractures of ribs, unspecified side, initial encounter for closed fracture: Secondary | ICD-10-CM

## 2014-05-19 DIAGNOSIS — S069X9A Unspecified intracranial injury with loss of consciousness of unspecified duration, initial encounter: Secondary | ICD-10-CM

## 2014-05-19 DIAGNOSIS — S069XAA Unspecified intracranial injury with loss of consciousness status unknown, initial encounter: Secondary | ICD-10-CM

## 2014-05-19 DIAGNOSIS — F1010 Alcohol abuse, uncomplicated: Secondary | ICD-10-CM

## 2014-05-19 MED ORDER — LACTULOSE 10 GM/15ML PO SOLN
45.0000 g | Freq: Three times a day (TID) | ORAL | Status: DC
  Administered 2014-05-19 – 2014-05-20 (×3): 45 g via ORAL
  Filled 2014-05-19 (×7): qty 90

## 2014-05-19 NOTE — Progress Notes (Signed)
Physical Therapy Note  Patient Details  Name: Jose Krueger MRN: [id number] Date of Birth: Feb 09, 1956 Today's Date: 05/19/2014    Patient missed 45 minutes of skilled physical therapy this PM secondary to refusal to participate. Various methods to encourage patient to participate (go retrieve beverage, play horseshoes or chess, etc.), however patient states "I'm just lazy today." Will follow up as able.   Whiting [name], PT, DPT 05/19/2014, 1:31 PM

## 2014-05-19 NOTE — Progress Notes (Signed)
More jerky with movements today. Will continue to monitor

## 2014-05-19 NOTE — Progress Notes (Signed)
PHYSICAL MEDICINE & REHABILITATION     PROGRESS NOTE    Subjective/Complaints: Pt calm this am, had breakfast No new issues overnite  Review of Systems - denies CP and SOB  Objective: Vital Signs: Blood pressure 143/64, pulse 105, temperature 98.7 F (37.1 C), temperature source Oral, resp. rate 19, weight 77.565 kg (171 lb), SpO2 100.00%. No results found. No results found for this basename: WBC, HGB, HCT, PLT,  in the last 72 hours No results found for this basename: NA, K, CL, CO, GLUCOSE, BUN, CREATININE, CALCIUM,  in the last 72 hours CBG (last 3)  No results found for this basename: GLUCAP,  in the last 72 hours  Wt Readings from Last 3 Encounters:  05/17/14 77.565 kg (171 lb)  04/30/14 83.416 kg (183 lb 14.4 oz)  04/30/14 83.416 kg (183 lb 14.4 oz)    Physical Exam:  Nursing note and vitals reviewed.  Constitutional: He appears well-developed and well-nourished.oriented to person, hospital, not [facility], "october, Friday" HENT:  Head: Normocephalic.  Neck:  Supple. .  Cardiovascular: Regular rhythm.  Respiratory: clear and unlabored  GI: Soft. Bowel sounds are normal. He exhibits no distension.  Musculoskeletal: He exhibits no edema.  Skin: abrasions healing Neurological:  Remains   Confused. Limited eye contact. Does follow simple commands.    Assessment/Plan: 1. Functional deficits secondary to TBI, polytrauma which require 3+ hours per day of interdisciplinary therapy in a comprehensive inpatient rehab setting. Physiatrist is providing close team supervision and 24 hour management of active medical problems listed below. Physiatrist and rehab team continue to assess barriers to discharge/monitor patient progress toward functional and medical goals.  FIM: FIM - Bathing Bathing Steps Patient Completed: Chest;Abdomen;Right Arm;Left Arm;Front perineal area;Buttocks;Right upper leg;Left upper leg;Right lower leg (including foot);Left lower leg  (including foot) Bathing: 4: Steadying assist  FIM - Upper Body Dressing/Undressing Upper body dressing/undressing steps patient completed: Thread/unthread right sleeve of pullover shirt/dresss;Thread/unthread left sleeve of pullover shirt/dress;Put head through opening of pull over shirt/dress;Pull shirt over trunk Upper body dressing/undressing: 5: Set-up assist to: Obtain clothing/put away FIM - Lower Body Dressing/Undressing Lower body dressing/undressing steps patient completed: Thread/unthread right pants leg;Thread/unthread left pants leg;Pull pants up/down;Don/Doff right sock;Don/Doff left sock;Don/Doff right shoe;Fasten/unfasten right shoe;Don/Doff left shoe;Fasten/unfasten left shoe;Thread/unthread left underwear leg;Thread/unthread right underwear leg;Pull underwear up/down Lower body dressing/undressing: 4: Steadying Assist  FIM - Toileting Toileting steps completed by patient: Adjust clothing prior to toileting;Performs perineal hygiene;Adjust clothing after toileting Toileting Assistive Devices: Grab bar or rail for support Toileting: 4: Steadying assist  FIM - Radio producer Devices: Grab bars Toilet Transfers: 4-To toilet/BSC: Min A (steadying Pt. > 75%);4-From toilet/BSC: Min A (steadying Pt. > 75%)  FIM - Bed/Chair Transfer Bed/Chair Transfer Assistive Devices: Arm rests Bed/Chair Transfer: 5: Supine > Sit: Supervision (verbal cues/safety issues);5: Sit > Supine: Supervision (verbal cues/safety issues);3: Bed > Chair or W/C: Mod A (lift or lower assist);2: Chair or W/C > Bed: Max A (lift and lower assist)  FIM - Locomotion: Wheelchair Locomotion: Wheelchair: 0: Activity did not occur FIM - Locomotion: Ambulation Locomotion: Ambulation Assistive Devices: Other (comment) (R HHA) Ambulation/Gait Assistance: 3: Mod assist;1: +1 Total assist Locomotion: Ambulation: 1: Travels 50 - 149 ft with total assistance/helper does all (Pt.<  25%)  Comprehension Comprehension Mode: Auditory Comprehension: 3-Understands basic 50 - 74% of the time/requires cueing 25 - 50%  of the time  Expression Expression Mode: Verbal Expression: 3-Expresses basic 50 - 74% of the time/requires cueing 25 -  50% of the time. Needs to repeat parts of sentences.  Social Interaction Social Interaction: 4-Interacts appropriately 75 - 89% of the time - Needs redirection for appropriate language or to initiate interaction.  Problem Solving Problem Solving: 2-Solves basic 25 - 49% of the time - needs direction more than half the time to initiate, plan or complete simple activities  Memory Memory: 3-Recognizes or recalls 50 - 74% of the time/requires cueing 25 - 49% of the time  Medical Problem List and Plan:  1. Functional deficits secondary to TBI , cognition and behavior clearing, still with reduced orientation 2. DVT Prophylaxis/Anticoagulation: Mechanical: Antiembolism stockings, thigh (TED hose) Bilateral lower extremities  Sequential compression devices, below knee Bilateral lower extremities  3. Pain Management: Monitor for outward signs of distress and medicate as appropriate.  4. Mood: Not able to determine at this time. H/o anxiety/depression/withdrawal with recommendation for outpatient treatment 02/2014. Will monitor as mentation improves. LCSW to follow along for evaluation/support when appropriate.  5. Neuropsych: This patient is not capable of making decisions on his own behalf.   -will move to LOW BED today  -continue ritalin for improved attention and initation  -tegretol for mood stabilization   -bid seroquel 6. Skin/Wound Care: wounds healing 7. Advanced Cirrhosis of liver/Liver mass?: Continue lactulose tid. ammonia levels pending  -MRI 12/2010 with two small suspicious liver lesions with recommendations for follow up at that time. (Had seen Dr. Benson [place] 2012 [place])  8. Left clavicle fracture: NWB LUE. Sling for support as able.   9. ABLA:    10. Activation:  added ritalin 11. Vitamin B 12 deficiency  LOS (Days) 44 A FACE TO FACE EVALUATION WAS PERFORMED  Charlett Blake 05/19/2014 8:49 AM

## 2014-05-20 ENCOUNTER — Inpatient Hospital Stay (HOSPITAL_COMMUNITY): Payer: Medicaid Other | Admitting: *Deleted

## 2014-05-20 MED ORDER — LACTULOSE 10 GM/15ML PO SOLN
45.0000 g | Freq: Three times a day (TID) | ORAL | Status: DC
  Administered 2014-05-20 – 2014-05-23 (×12): 45 g via ORAL
  Filled 2014-05-20 (×18): qty 90

## 2014-05-20 NOTE — Progress Notes (Signed)
[place] PHYSICAL MEDICINE & REHABILITATION     PROGRESS NOTE    Subjective/Complaints: Pt calm this am, had breakfast Nursing notes increased tremor during self feeding No new issues overnite  Review of Systems - denies CP and SOB  Objective: Vital Signs: Blood pressure 154/92, pulse 88, temperature 99 F (37.2 C), temperature source Oral, resp. rate 18, weight 77.565 kg (171 lb), SpO2 99.00%. No results found. No results found for this basename: WBC, HGB, HCT, PLT,  in the last 72 hours No results found for this basename: NA, K, CL, CO, GLUCOSE, BUN, CREATININE, CALCIUM,  in the last 72 hours CBG (last 3)  No results found for this basename: GLUCAP,  in the last 72 hours  Wt Readings from Last 3 Encounters:  05/17/14 77.565 kg (171 lb)  04/30/14 83.416 kg (183 lb 14.4 oz)  04/30/14 83.416 kg (183 lb 14.4 oz)    Physical Exam:  Nursing note and vitals reviewed.  Constitutional: He appears well-developed and well-nourished.oriented to person, hospital, not [facility], "october, Friday" HENT:  Head: Normocephalic.  Neck:  Supple. .  Cardiovascular: Regular rhythm.  Respiratory: clear and unlabored  GI: Soft. Bowel sounds are normal. He exhibits no distension.  Musculoskeletal: He exhibits no edema.  Skin: abrasions healing Neurological:  Remains   Confused. Limited eye contact. Does follow simple commands.    Assessment/Plan: 1. Functional deficits secondary to TBI, polytrauma which require 3+ hours per day of interdisciplinary therapy in a comprehensive inpatient rehab setting. Physiatrist is providing close team supervision and 24 hour management of active medical problems listed below. Physiatrist and rehab team continue to assess barriers to discharge/monitor patient progress toward functional and medical goals.  FIM: FIM - Bathing Bathing Steps Patient Completed: Chest;Abdomen;Right Arm;Left Arm;Front perineal area;Buttocks;Right upper leg;Left upper leg;Right  lower leg (including foot);Left lower leg (including foot) Bathing: 4: Steadying assist  FIM - Upper Body Dressing/Undressing Upper body dressing/undressing steps patient completed: Thread/unthread right sleeve of pullover shirt/dresss;Thread/unthread left sleeve of pullover shirt/dress;Put head through opening of pull over shirt/dress;Pull shirt over trunk Upper body dressing/undressing: 5: Set-up assist to: Obtain clothing/put away FIM - Lower Body Dressing/Undressing Lower body dressing/undressing steps patient completed: Thread/unthread right pants leg;Thread/unthread left pants leg;Pull pants up/down;Don/Doff right sock;Don/Doff left sock;Don/Doff right shoe;Fasten/unfasten right shoe;Don/Doff left shoe;Fasten/unfasten left shoe;Thread/unthread left underwear leg;Thread/unthread right underwear leg;Pull underwear up/down Lower body dressing/undressing: 4: Steadying Assist  FIM - Toileting Toileting steps completed by patient: Adjust clothing prior to toileting;Performs perineal hygiene;Adjust clothing after toileting Toileting Assistive Devices: Grab bar or rail for support Toileting: 4: Steadying assist  FIM - Radio producer Devices: Grab bars Toilet Transfers: 4-To toilet/BSC: Min A (steadying Pt. > 75%);4-From toilet/BSC: Min A (steadying Pt. > 75%)  FIM - Bed/Chair Transfer Bed/Chair Transfer Assistive Devices: Arm rests Bed/Chair Transfer: 0: Activity did not occur  FIM - Locomotion: Wheelchair Locomotion: Wheelchair: 0: Activity did not occur FIM - Locomotion: Ambulation Locomotion: Ambulation Assistive Devices: Other (comment) (R HHA) Ambulation/Gait Assistance: Not tested (comment) Locomotion: Ambulation: 0: Activity did not occur  Comprehension Comprehension Mode: Auditory Comprehension: 2-Understands basic 25 - 49% of the time/requires cueing 51 - 75% of the time  Expression Expression Mode: Verbal Expression: 2-Expresses basic 25 - 49%  of the time/requires cueing 50 - 75% of the time. Uses single words/gestures.  Social Interaction Social Interaction: 4-Interacts appropriately 75 - 89% of the time - Needs redirection for appropriate language or to initiate interaction.  Problem Solving Problem Solving: 2-Solves basic  25 - 49% of the time - needs direction more than half the time to initiate, plan or complete simple activities  Memory Memory: 3-Recognizes or recalls 50 - 74% of the time/requires cueing 25 - 49% of the time  Medical Problem List and Plan:  1. Functional deficits secondary to TBI , cognition and behavior clearing, still with reduced orientation 2. DVT Prophylaxis/Anticoagulation: Mechanical: Antiembolism stockings, thigh (TED hose) Bilateral lower extremities  Sequential compression devices, below knee Bilateral lower extremities  3. Pain Management: Monitor for outward signs of distress and medicate as appropriate.  4. Mood: Not able to determine at this time. H/o anxiety/depression/withdrawal with recommendation for outpatient treatment 02/2014. Will monitor as mentation improves. LCSW to follow along for evaluation/support when appropriate.  5. Neuropsych: This patient is not capable of making decisions on his own behalf.   -will move to LOW BED today  -continue ritalin for improved attention and initation  -tegretol for mood stabilization   -bid seroquel 6. Skin/Wound Care: wounds healing 7. Advanced Cirrhosis of liver/Liver mass?: increase lactulose ,. ammonia levels trending up  -MRI 12/2010 with two small suspicious liver lesions with recommendations for follow up at that time. (Had seen Dr. Benson [place] 2012 [place])  8. Left clavicle fracture: NWB LUE. Sling for support as able.  9. ABLA:    10. Activation:  added ritalin 11. Vitamin B 12 deficiency  LOS (Days) 20 A FACE TO FACE EVALUATION WAS PERFORMED  KIRSTEINS,ANDREW E 05/20/2014 8:16 AM

## 2014-05-20 NOTE — Progress Notes (Signed)
Physical Therapy Session Note  Patient Details  Name: Jose Krueger MRN: [id number] Date of Birth: 18-May-1956  Today's Date: 05/20/2014 PT Individual Time: 0900-0923 PT Individual Time Calculation (min): 23 min   Short Term Goals: Week 3:  PT Short Term Goal 1 (Week 3): STGs=LTGs  Skilled Therapeutic Interventions/Progress Updates:    Patient received semi-reclined in bed, asleep, but easily awakened. Patient immediately questioning therapist about how many jelly beans he gave her. Attempted to redirect several times, but patient insisting that therapist gave him "13 of something" and he "needs to know what it is". Patient continues to state that therapist is ""lying to him" and that he "wants to stay here in his death bed." Additionally, patient stating he is "too depressed to go out of the room and see the happy faces".   After approx 17 min, able to encourage/coax patient to transfer bed>recliner, using patient's questions as motivation. Patient requires modA for transfer secondary to attempting to transfer by climbing from bed>recliner without putting B LEs on floor. Once in recliner, patient with increasing agitation and not wanting to leave room. Pushed patient in recliner for several minutes until he de-escalated. Once at RN station, patient refusing to wear seatbelt, required second person to distract patient while seatbelt donned. Patient left sitting in recliner with seatbelt donned and feet elevated at RN station. Notified staff to be aware of patient status.  Therapy Documentation Precautions:  Precautions Precautions: Fall Precaution Comments: left scapula and left clavicle fractures Required Braces or Orthoses: Sling Restrictions Weight Bearing Restrictions: Yes LUE Weight Bearing: Non weight bearing Other Position/Activity Restrictions: per ortho notes: AROM allowed for LT UE General: PT Amount of Missed Time (min): 37 Minutes PT Missed Treatment Reason: Increased  agitation;Patient unwilling to participate Pain: Pain Assessment Pain Assessment: No/denies pain Pain Score: 0-No pain Locomotion : Ambulation Ambulation/Gait Assistance: Not tested (comment)   See FIM for current functional status  Therapy/Group: Individual Therapy  Lillia Abed. Laneka Mcgrory, PT, DPT 05/20/2014, 9:28 AM

## 2014-05-21 ENCOUNTER — Inpatient Hospital Stay (HOSPITAL_COMMUNITY): Payer: Medicaid Other | Admitting: *Deleted

## 2014-05-21 ENCOUNTER — Inpatient Hospital Stay (HOSPITAL_COMMUNITY): Payer: Medicaid Other

## 2014-05-21 ENCOUNTER — Inpatient Hospital Stay (HOSPITAL_COMMUNITY): Payer: Medicaid Other | Admitting: Speech Pathology

## 2014-05-21 NOTE — Progress Notes (Signed)
Occupational Therapy Session Note  Patient Details  Name: Jose Krueger MRN: [id number] Date of Birth: 1956-05-06  Today's Date: 05/21/2014 OT Individual Time: 1100-1200 OT Individual Time Calculation (min): 60 min    Short Term Goals: Week 3:  OT Short Term Goal 1 (Week 3): STGs=LTGs  Skilled Therapeutic Interventions/Progress Updates: ADL retraining (30 min) with emphasis on dynamic standing balance, functional mobility, safety awareness, immediate recall, and endurance.   Pt received in his recliner with sitter providing remote supervision.    Pt was receptive for ADL and participated with bathing and dressing with max cues to orient to task d/t being unaware of location of clothing or to sequence with task.   Pt ambulated to bathroom with steadying assist and elected to undress standing up with poor balance when attempting to remove pants.    Pt complied with cue to sit to remove socks and brief and then ambulated to tub bench for shower, sitting and standing.   Pt bathed completely with steadying assist while standing to clean his buttocks.   Pt returned to his recliner with steadying assist and tactile cue for positioning before sitting.   Pt dressed with setup assist and steadying assist while standing to pull up his pants.     Therapeutic Activity (30 min).   Pt ambulated to gym, with verbal cues for direction, contact guard and steadying assist for gait, and after gathering supplies with therapist completed 3 games of horseshoes before reporting fatigue and need to return his room for lunch.   Pt reached to floor to pick up horseshoes each time with mod assist to support bending and raising to full height d/t impaired balance.   Pt reported satisfaction with task as improving his endurance and balance while occupied with a meaningful activity of his choice.     Therapy Documentation Precautions:  Precautions Precautions: Fall Precaution Comments: left scapula and left clavicle  fractures Required Braces or Orthoses: Sling Restrictions Weight Bearing Restrictions: Yes LUE Weight Bearing: Non weight bearing Other Position/Activity Restrictions: per ortho notes: AROM allowed for LT UE  Pain: Pain Assessment Pain Assessment: No/denies pain Pain Score: 0-No pain  See FIM for current functional status  Therapy/Group: Individual Therapy  [place] 05/21/2014, 12:20 PM

## 2014-05-21 NOTE — Progress Notes (Signed)
Physical Therapy Session Note  Patient Details  Name: Jose Krueger MRN: [id number] Date of Birth: 10-25-1955  Today's Date: 05/21/2014 PT Individual Time: 0900-1000 and 1500-1545 PT Individual Time Calculation (min): 60 min and 45 min  Short Term Goals: Week 3:  PT Short Term Goal 1 (Week 3): STGs=LTGs  Skilled Therapeutic Interventions/Progress Updates:    AM Session: Patient received semi-reclined in bed, agreeable to therapy. Session focused on functional transfers, LE/trunk NMR, and gait training. See details below for NMR activities. Gait training in controlled environment x`174' with R HHA and minA. Patient with improved tolerance to gait training as compared to last week, no ataxia noted. Stair negotiation x5 steps with R handrail and minA, alternating pattern. NuStep Level 2 with B LEs and R UE x8' for improved activity tolerance and coordination. Patient left sitting in recliner with seatbelt donned at RN station.  PM Session: Patient received sitting in recliner at RN station with request to use bathroom. Patient transported back to room via recliner and performed ambulation in room with R HHA and minA to toilet, continent of bowel. Patient able to manage clothing and sit on toilet with minA. Remainder of session focused on high level balance: side stepping, toe walking, heel raises with minA overall.   5 time sit<>stand test: 15.1" (average of 3 trials); >15" indicates a moderate falls risk.  Therapy Documentation Precautions:  Precautions Precautions: Fall Precaution Comments: left scapula and left clavicle fractures Required Braces or Orthoses: Sling Restrictions Weight Bearing Restrictions: Yes LUE Weight Bearing: Non weight bearing Other Position/Activity Restrictions: per ortho notes: AROM allowed for LT UE Pain: Pain Assessment Pain Assessment: No/denies pain Pain Score: 0-No pain Locomotion : Ambulation Ambulation/Gait Assistance: 4: Min assist  Other  Treatments: Treatments Neuromuscular Facilitation: Right;Left;Lower Extremity;Activity to increase coordination;Activity to increase motor control;Activity to increase timing and sequencing;Activity to increase grading;Activity to increase sustained activation;Activity to increase anterior-posterior weight shifting Weight Bearing Technique Weight Bearing Technique: Yes RUE Weight Bearing Technique: High kneeling;Quadruped Response to Weight Bearing Technique: Tall Kneeling with [place]ce[place] for R UE support only secondary to NWB precautions on L UE. Transitioned to tall kneeling squats the quadruped with only R UE; patient requires minA overall.  See FIM for current functional status  Therapy/Group: Individual Therapy  Lillia Abed. [place] Hoban, PT, DPT 05/21/2014, 10:01 AM

## 2014-05-21 NOTE — Progress Notes (Signed)
Speech Language Pathology Daily Session Note  Patient Details  Name: Jose Krueger MRN: [id number] Date of Birth: April 21, 1956  Today's Date: 05/21/2014 SLP Individual Time: 1300-1400 SLP Individual Time Calculation (min): 60 min  Short Term Goals: Week 3: SLP Short Term Goal 1 (Week 3): Patient will demonstrate sustained attention to a functioanl task for 15  minutes with Mod A multimodal cues.  SLP Short Term Goal 2 (Week 3): Patient will orient to place, time and situation with Mod A multimodal cues.  SLP Short Term Goal 3 (Week 3): Patient will initiate functional tasks with Mod A mulimodal cues.  SLP Short Term Goal 4 (Week 3): Patient will verbalize wants/needs with Min A multimodal cues.  SLP Short Term Goal 5 (Week 3): Patient will identify 1 cognitive and 1 physical deficit with Max A multimodal cues.  SLP Short Term Goal 6 (Week 3): Patient will demonstrate efficient mastication with trials of regular textures with Min A multimodal cues.   Skilled Therapeutic Interventions: Skilled treatment session focused on cognitive-linguistic goals. SLP facilitated session by providing Max A multimodal cues for orientation to place, time and situation and for recall of information after a ~2 minute delay. Patient demonstrated intermittent language of confusion towards the end of session but was easily redirected. Patient provided verbal instruction of rules to a mildly complex but familiar task to this clinician with Min-Mod A multimodal cues and demonstrated selective attention to task for ~60 minutes with supervision verbal cues for redirection. Patient left at RN station in recliner with quick release belt in place.  Continue with current plan of care.   FIM:  Comprehension Comprehension Mode: Auditory Comprehension: 2-Understands basic 25 - 49% of the time/requires cueing 51 - 75% of the time Expression Expression Mode: Verbal Expression: 2-Expresses basic 25 - 49% of the time/requires  cueing 50 - 75% of the time. Uses single words/gestures. Social Interaction Social Interaction: 3-Interacts appropriately 50 - 74% of the time - May be physically or verbally inappropriate. Problem Solving Problem Solving: 2-Solves basic 25 - 49% of the time - needs direction more than half the time to initiate, plan or complete simple activities Memory Memory: 1-Recognizes or recalls less than 25% of the time/requires cueing greater than 75% of the time  Pain Pain Assessment Pain Assessment: No/denies pain  Therapy/Group: Individual Therapy  Kobie Matkins, [place] 05/21/2014, 3:54 PM
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
## 2014-05-22 ENCOUNTER — Inpatient Hospital Stay (HOSPITAL_COMMUNITY): Payer: Self-pay | Admitting: Occupational Therapy

## 2014-05-22 ENCOUNTER — Encounter (HOSPITAL_COMMUNITY): Payer: Self-pay

## 2014-05-22 ENCOUNTER — Inpatient Hospital Stay (HOSPITAL_COMMUNITY): Payer: Medicaid Other | Admitting: *Deleted

## 2014-05-22 ENCOUNTER — Inpatient Hospital Stay (HOSPITAL_COMMUNITY): Payer: Medicaid Other | Admitting: Speech Pathology

## 2014-05-22 MED ORDER — ENSURE COMPLETE PO LIQD: 237.0000 mL | Freq: Two times a day (BID) | ORAL | Status: DC

## 2014-05-22 MED ORDER — FOLIC ACID 1 MG PO TABS: 1.0000 mg | ORAL_TABLET | Freq: Every day | ORAL | Status: AC

## 2014-05-22 MED ORDER — POLYETHYLENE GLYCOL 3350 17 G PO PACK: 17.0000 g | PACK | Freq: Every day | ORAL | Status: DC

## 2014-05-22 MED ORDER — PANTOPRAZOLE SODIUM 40 MG PO TBEC
40.0000 mg | DELAYED_RELEASE_TABLET | Freq: Every day | ORAL | Status: DC
  Administered 2014-05-22 – 2014-05-23 (×2): 40 mg via ORAL
  Filled 2014-05-22 (×2): qty 1

## 2014-05-22 MED ORDER — NICOTINE 21 MG/24HR TD PT24: 21.0000 mg | MEDICATED_PATCH | Freq: Every day | TRANSDERMAL | Status: DC

## 2014-05-22 MED ORDER — QUETIAPINE FUMARATE 25 MG PO TABS: ORAL_TABLET | ORAL | Status: DC

## 2014-05-22 MED ORDER — CARBAMAZEPINE 200 MG PO TABS: 200.0000 mg | ORAL_TABLET | Freq: Three times a day (TID) | ORAL | Status: DC

## 2014-05-22 MED ORDER — CHLORHEXIDINE GLUCONATE 0.12 % MT SOLN: 15.0000 mL | Freq: Two times a day (BID) | OROMUCOSAL | Status: DC

## 2014-05-22 MED ORDER — IPRATROPIUM-ALBUTEROL 0.5-2.5 (3) MG/3ML IN SOLN: 3.0000 mL | Freq: Four times a day (QID) | RESPIRATORY_TRACT | Status: DC | PRN

## 2014-05-22 MED ORDER — METHYLPHENIDATE HCL 10 MG PO TABS: 10.0000 mg | ORAL_TABLET | Freq: Two times a day (BID) | ORAL | Status: DC

## 2014-05-22 MED ORDER — PANTOPRAZOLE SODIUM 40 MG PO TBEC: 40.0000 mg | DELAYED_RELEASE_TABLET | Freq: Every day | ORAL | Status: DC

## 2014-05-22 MED ORDER — ADULT MULTIVITAMIN W/MINERALS CH
1.0000 | ORAL_TABLET | Freq: Every day | ORAL | Status: DC
Start: 2014-05-22 — End: 2016-07-01

## 2014-05-22 MED ORDER — ACETAMINOPHEN 325 MG PO TABS: 325.0000 mg | ORAL_TABLET | ORAL | Status: DC | PRN

## 2014-05-22 MED ORDER — LACTULOSE 10 GM/15ML PO SOLN: 45.0000 g | Freq: Three times a day (TID) | ORAL | Status: AC

## 2014-05-22 MED ORDER — THIAMINE HCL 100 MG PO TABS: 100.0000 mg | ORAL_TABLET | Freq: Every day | ORAL | Status: DC

## 2014-05-22 NOTE — Progress Notes (Signed)
Physical Therapy Discharge Summary  Patient Details  Name: Jose Krueger MRN: [id number] Date of Birth: 1955/11/04  Today's Date: 05/22/2014 PT Individual Time: 1400-1430 PT Individual Time Calculation (min): 30 min    Patient has met 2 of 14 long term goals due to improved activity tolerance, improved balance, improved postural control, increased strength, decreased pain, ability to compensate for deficits, functional use of  left upper extremity and left lower extremity, improved attention, improved awareness and improved coordination.  Patient to discharge at an ambulatory level [place].   Patient's care partner not necessary to complete family training secondary to patient discharging to SNF to provide the necessary physical and cognitive assistance at discharge.  Reasons goals not met: Due to fluctuating alertness/arousal levels and varying participation levels, patient unable to reach overall supervision level; Patient's CLOF is minA overall.  Recommendation:  Patient will benefit from ongoing skilled PT services in skilled nursing facility setting to continue to advance safe functional mobility, address ongoing impairments in strength, balance, postural control, functional mobility, gait, and minimize fall risk.  Equipment: No equipment provided  Reasons for discharge: discharge from hospital  Patient/family agrees with progress made and goals achieved: Yes  PT Discharge Precautions/Restrictions Precautions Precautions: Fall Precaution Comments: left scapula and left clavicle fractures Restrictions Weight Bearing Restrictions: Yes LUE Weight Bearing: Non weight bearing Other Position/Activity Restrictions: per ortho notes: AROM allowed for LT UE Pain Pain Assessment Pain Assessment: No/denies pain Pain Score: 4  Pain Type: Acute pain Pain Location: Shoulder Pain Orientation: Left Pain Descriptors / Indicators: Aching;Sore Pain Onset: On-going Pain Intervention(s):  RN made aware;Repositioned;Ambulation/increased activity Multiple Pain Sites: No Vision/Perception    See OT note Cognition Overall Cognitive Status: Impaired/Different from baseline Arousal/Alertness: Awake/alert Orientation Level: Oriented to person;Oriented to place;Oriented to situation;Disoriented to time Attention: Focused Focused Attention: Appears intact Focused Attention Impairment: Verbal basic Memory: Impaired Memory Impairment: Storage deficit;Decreased recall of new information;Decreased short term memory;Retrieval deficit Decreased Short Term Memory: Verbal basic;Functional basic Awareness: Impaired Awareness Impairment: Emergent impairment Problem Solving: Impaired Problem Solving Impairment: Verbal basic;Functional basic Behaviors: Impulsive;Perseveration;Poor frustration tolerance Safety/Judgment: Impaired Comments: poor safety awareness [employer] Scales of Cognitive Functioning: Confused/appropriate Sensation Sensation Light Touch: Appears Intact Stereognosis: Not tested Hot/Cold: Appears Intact Proprioception: Appears Intact Coordination Gross Motor Movements are Fluid and Coordinated: No Fine Motor Movements are Fluid and Coordinated: No Motor  Motor Motor: Hemiplegia Motor - Discharge Observations: very mild L hemiplegia  Mobility Bed Mobility Bed Mobility: Supine to Sit;Sit to Supine Supine to Sit: HOB flat;5: Supervision Supine to Sit Details: Verbal cues for precautions/safety;Tactile cues for initiation Sit to Supine: 5: Supervision;HOB flat Sit to Supine - Details: Verbal cues for precautions/safety;Tactile cues for initiation Transfers Transfers: Yes Sit to Stand: With upper extremity assist;From chair/3-in-1;From bed;4: Min guard;With armrests Sit to Stand Details: Manual facilitation for weight shifting;Verbal cues for sequencing;Verbal cues for technique;Tactile cues for initiation;Verbal cues for precautions/safety Stand to Sit: To  chair/3-in-1;To bed;With upper extremity assist;4: Min guard;With armrests Stand to Sit Details (indicate cue type and reason): Verbal cues for sequencing;Manual facilitation for weight shifting;Verbal cues for precautions/safety;Verbal cues for technique;Tactile cues for initiation Stand Pivot Transfers: 4: Min guard;4: Min assist;With armrests Stand Pivot Transfer Details: Verbal cues for sequencing;Manual facilitation for weight shifting;Verbal cues for precautions/safety;Verbal cues for technique;Tactile cues for initiation Locomotion  Ambulation Ambulation: Yes Ambulation/Gait Assistance: 4: Min assist Ambulation Distance (Feet): 300 Feet Assistive device: 1 person hand held assist Ambulation/Gait Assistance Details: Tactile cues for initiation;Verbal cues  for precautions/safety;Tactile cues for weight shifting;Verbal cues for gait pattern Ambulation/Gait Assistance Details: MinA overall for gait training secondary to poor L foot clearance. Gait Gait: Yes Gait Pattern: Impaired Gait Pattern: Step-through pattern;Decreased step length - right;Decreased step length - left;Decreased stride length;Narrow base of support;Trunk flexed Stairs / Additional Locomotion Stairs: Yes Stairs Assistance: 4: Min assist Stairs Assistance Details: Verbal cues for sequencing;Verbal cues for precautions/safety;Tactile cues for weight shifting Stair Management Technique: One rail Right;Step to pattern;Forwards;Alternating pattern Number of Stairs: 5 Height of Stairs: 6 Wheelchair Mobility Wheelchair Mobility: No  Trunk/Postural Assessment  Cervical Assessment Cervical Assessment: Within Functional Limits Thoracic Assessment Thoracic Assessment: Within Functional Limits Lumbar Assessment Lumbar Assessment: Within Functional Limits Postural Control Postural Control: Deficits on evaluation Righting Reactions: delayed Protective Responses: delayed  Balance Balance Balance Assessed:  Yes Standardized Balance Assessment Standardized Balance Assessment: Berg Balance Test Berg Balance Test Sit to Stand: Able to stand without using hands and stabilize independently Standing Unsupported: Able to stand 2 minutes with supervision Sitting with Back Unsupported but Feet Supported on Floor or Stool: Able to sit safely and securely 2 minutes Stand to Sit: Sits safely with minimal use of hands Transfers: Able to transfer with verbal cueing and /or supervision Standing Unsupported with Eyes Closed: Able to stand 10 seconds with supervision Standing Ubsupported with Feet Together: Able to place feet together independently and stand for 1 minute with supervision From Standing, Reach Forward with Outstretched Arm: Can reach confidently >25 cm (10") From Standing Position, Pick up Object from Floor: Able to pick up shoe, needs supervision From Standing Position, Turn to Look Behind Over each Shoulder: Looks behind one side only/other side shows less weight shift Turn 360 Degrees: Able to turn 360 degrees safely but slowly Standing Unsupported, Alternately Place Feet on Step/Stool: Able to complete >2 steps/needs minimal assist Standing Unsupported, One Foot in Front: Able to plae foot ahead of the other independently and hold 30 seconds Standing on One Leg: Tries to lift leg/unable to hold 3 seconds but remains standing independently Total Score: 40/56, indicating >80% risk of falls; Initial BERG Balance Score: 20/56 Static Sitting Balance Static Sitting - Balance Support: Bilateral upper extremity supported;Feet supported;Feet unsupported Static Sitting - Level of Assistance: 5: Stand by assistance Static Standing Balance Static Standing - Balance Support: Right upper extremity supported;During functional activity Static Standing - Level of Assistance: 4: Min assist;5: Stand by assistance Extremity Assessment  RLE Assessment RLE Assessment: Within Functional Limits RLE Strength RLE  Overall Strength: Within Functional Limits for tasks assessed LLE Assessment LLE Assessment: Within Functional Limits LLE Strength LLE Overall Strength: Within Functional Limits for tasks assessed  See FIM for current functional status  Lillia Abed. Jeniece Hannis, PT, DPT 05/22/2014, 2:49 PM
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
## 2014-05-22 NOTE — Progress Notes (Signed)
Social Work Patient ID: Jose Krueger, male   DOB: 1956-06-01, 58 y.o.   MRN: [id number]  Have received a SNF bed offer today from [facility] who would like to admit pt tomorrow.  Have discussed with pt and girlfriend.  Pt agreeable but notes "...that's a long way away..".  Girlfriend also disappointed that facility is not closer, however, understands that this facility is willing to take with Medicaid pending.  Girlfriend to accompany pt tomorrow to assist with paperwork as pt without any family or designated representative.  Alerting tx team and MD/ PA.  Lennart Pall, LCSW

## 2014-05-22 NOTE — Progress Notes (Signed)
The skilled treatment note has been reviewed and SLP is in agreement.  Cayman Brogden, M.A., CCC-SLP  [phone]   
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
## 2014-05-22 NOTE — Progress Notes (Signed)
Physical Therapy Weekly Progress Note  Patient Details  Name: Jose Krueger MRN: [id number] Date of Birth: 05/29/1956  Beginning of progress report period: May 15, 2014 End of progress report period: May 22, 2014  Today's Date: 05/22/2014 PT Individual Time: 1107-1200 PT Individual Time Calculation (min): 53 min   Patient has met 2 of 14 long term goals. Patient's progress has fluctuated during this week on rehab. Patient can require as little as min guard for all functional mobility (gait, stair negotiation, transfers, and bed mobility), but had required up to max-totalA over the last few days secondary to truncal and limb ataxia. These ataxic-like symptoms seem to have resolved over the last day or so, however. Patient has demonstrated consistent improved behavior, with less episodes of cursing and yelling. Patient demonstrating improved intellectual awareness in regards to deficits and is consistently oriented x4. Patient is currently demonstrating behaviors consistent with Rancho Level VI.   Patient continues to demonstrate the following deficits: poor activity tolerance, cognitive deficits, decreased balance and balance strategies, decreased postural control (pain likely a contribution), decreased ability to compensate for deficits, decreased functional use of L UE, decreased attention, decreased awareness, poor safety awareness, decreased coordination, decreased knowledge of and adherence to weight bearing precautions and therefore will continue to benefit from skilled PT intervention to enhance overall performance with activity tolerance, balance, postural control, ability to compensate for deficits, functional use of  left upper extremity and left lower extremity, attention, awareness, coordination and knowledge of precautions.  See Patient's Care Plan for progression toward long term goals.  Patient progressing toward long term goals..  Continue plan of care.  Skilled  Therapeutic Interventions/Progress Updates:    Patient received sitting in recliner at RN station. Session focused on functional ambulation, dynamic standing balance, and functional transfers (including floor transfer). Functional ambulation ~ 16' with shopping cart and minA to retrieve various items in hallway (on floor, handrail, etc.) to facilitate visual scanning and dynamic balance challenge to pick up items. Patient then instructed to put items away, including medicine balls of different weights; requires minA overall. Floor transfer with mod cues to maintain NWB L UE, performs with minA. Stair negotiation x5 stairs with R Handrail and minA, min cues for sequencing. Gait training >300'x1 with R HHA and minA, including retrieving cup with ice and can of soda and carrying for part of ambulation. Patient left sitting in recliner with seatbelt donned at RN station.  Therapy Documentation Precautions:  Precautions Precautions: Fall Precaution Comments: left scapula and left clavicle fractures Required Braces or Orthoses: Sling Restrictions Weight Bearing Restrictions: Yes LUE Weight Bearing: Non weight bearing Other Position/Activity Restrictions: per ortho notes: AROM allowed for LT UE General: PT Amount of Missed Time (min): 7 Minutes PT Missed Treatment Reason: Other (Comment) (therapist's prior session ran late) Pain: Pain Assessment Pain Assessment: 0-10 Pain Score: 4  Pain Type: Acute pain Pain Location: Shoulder Pain Orientation: Left Pain Descriptors / Indicators: Aching;Sore Pain Onset: On-going Pain Intervention(s): RN made aware;Repositioned;Ambulation/increased activity Multiple Pain Sites: No Locomotion : Ambulation Ambulation/Gait Assistance: 4: Min assist  See FIM for current functional status  Therapy/Group: Individual Therapy  Lillia Abed. [name], PT, DPT 05/22/2014, 12:18 PM

## 2014-05-22 NOTE — Progress Notes (Signed)
Occupational Therapy Session Note  Patient Details  Name: MARCELLES CLINARD MRN: [id number] Date of Birth: 03/28/1956  Today's Date: 05/22/2014 OT Individual Time: 0900-1000 OT Individual Time Calculation (min): 60 min    Short Term Goals: Week 3:  OT Short Term Goal 1 (Week 3): STGs=LTGs  Skilled Therapeutic Interventions/Progress Updates:    Pt initially engaged in BADL retraining including bathing at shower level and dressing with sit<>stand from w/c.  Pt required HHA for ambulation to/from bathroom and shower.  Pt completed bathing and dressing tasks with close supervision.  Pt amb with HHA to day room to complete pipe tree tasks (X 3 with increasing difficulty) while seated.  Pt completed tasks successfully. Pt returned to nurses station.  Focus on activity tolerance, dynamic standing balance, orientation, cognitive remediation, functional amb with HHA, and safety awareness.  Therapy Documentation Precautions:  Precautions Precautions: Fall Precaution Comments: left scapula and left clavicle fractures Required Braces or Orthoses: Sling Restrictions Weight Bearing Restrictions: Yes LUE Weight Bearing: Non weight bearing Other Position/Activity Restrictions: per ortho notes: AROM allowed for LT UE General:    See FIM for current functional status  Therapy/Group: Individual Therapy  Leroy Libman 05/22/2014, 10:05 AM

## 2014-05-22 NOTE — Progress Notes (Signed)
Occupational Therapy Session Note  Patient Details  Name: Jose Krueger MRN: [id number] Date of Birth: December 09, 1955  Today's Date: 05/22/2014 OT Individual Time: 1300-1330 OT Individual Time Calculation (min): 30 min    Short Term Goals: Week 3:  OT Short Term Goal 1 (Week 3): STGs=LTGs  Skilled Therapeutic Interventions/Progress Updates:    1:1 Cognitive retraining: Ambulated with min A to moderate distracting gym to participate in cognitive task. Pt engaged in Spot It and Uno at table top with focus on selective attention with min A with extra time for entire 30 min session, simple functional problem solving, working memory, orientation (to place, and situation) and sequencing and turn taking.   Therapy Documentation Precautions:  Precautions Precautions: Fall Precaution Comments: left scapula and left clavicle fractures Required Braces or Orthoses: Sling Restrictions Weight Bearing Restrictions: Yes LUE Weight Bearing: Non weight bearing Other Position/Activity Restrictions: per ortho notes: AROM allowed for LT UE Pain: Pain Assessment Pain Assessment: No/denies pain  See FIM for current functional status  Therapy/Group: Individual Therapy  Willeen Cass [facility] 05/22/2014, 4:53 PM

## 2014-05-22 NOTE — Progress Notes (Signed)
Speech Language Pathology Session Note and Discharge Summary  Patient Details  Name: Jose Krueger MRN: [id number] Date of Birth: March 04, 1956  Today's Date: 05/22/2014 SLP Individual Time: 1330-1400 SLP Individual Time Calculation (min): 30 min   Skilled Therapeutic Interventions:   Skilled treatment session focused on cognitive-linguistic goals. Student facilitated session by providing Supervision question cues for orientation to place and situation but required Max A multimodal cues for orientation to date. Student also facilitated session by providing Supervision multimodal cues for following 1, 2 and 3 step commands and recalling information from a paragraph read aloud and was Mod I for answering complex yes/no questions. Patient required Min A question cues to recall events from previous therapy sessions and was [facility] with reading and writing tasks assessed. Patient handed off to PT in recliner with quick release belt in place. Continue with current plan of care.     Patient has met 6 of 6 long term goals.  Patient to discharge at overall Mod level.   Reasons goals not met: N/A   Clinical Impression/Discharge Summary: Patient has made functional gains and has met 6 of 6 LTG's this admission due to increased attention, problem solving, awareness, working memory and verbal expression of wants/needs. Currently, patient demonstrates behaviors consistent with a Rancho level VI and requires overall Mod A to perform basic and familiar tasks safely and efficiently. Patient continues to demonstrate intermittent language of confusion but is easily redirected. Patient is also currently consuming Dys. 3 textures with thin liquids via cup with minimal overt s/s of aspiration and requires Supervision multimodal cues for use of swallowing compensatory strategies. Patient will discharge to a skilled nursing facility and would benefit from f/u skilled SLP services to maximize his cognitive-linguistic and  swallowing function in order to reduce burden of care.    Care Partner:  Caregiver Able to Provide Assistance: No  Type of Caregiver Assistance: Physical;Cognitive  Recommendation:  Skilled Nursing facility  Rationale for SLP Follow Up: Reduce caregiver burden;Maximize cognitive function and independence;Maximize swallowing safety   Equipment: N/A   Reasons for discharge: Discharged from hospital   Patient/Family Agrees with Progress Made and Goals Achieved: Yes   See FIM for current functional status  Parisa Pinela 05/22/2014, 3:33 PM

## 2014-05-23 ENCOUNTER — Inpatient Hospital Stay (HOSPITAL_COMMUNITY): Payer: Medicaid Other
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
## 2014-05-23 NOTE — Progress Notes (Signed)
Social Work  Discharge Note  The overall goal for the admission was met for:   Discharge location: No - plan changed to SNF as girlfriend could not provide adequate care at home  Length of Stay: No - longer LOS due to no payor for SNF - LOS = 23 days  Discharge activity level: Yes - supervision overall  Home/community participation: Yes  Services provided included: MD, RD, PT, OT, SLP, RN, TR, Pharmacy, Neuropsych and SW  Financial Services: Other: Medicaid and SSD apps pending  Follow-up services arranged: Other: SNF at [facility]  Comments (or additional information):  Patient/Family verbalized understanding of follow-up arrangements: Yes  Individual responsible for coordination of the follow-up plan: patient/ girlfriend  Confirmed correct DME delivered: NA    ,  

## 2014-05-23 NOTE — Progress Notes (Signed)
Note/chart reviewed. Agree with note.   Roniqua Kintz RD, LDN, CNSC [phone] Pager [phone] After Hours Pager   

## 2014-05-23 NOTE — Progress Notes (Signed)
[place] PHYSICAL MEDICINE & REHABILITATION     PROGRESS NOTE    Subjective/Complaints: No new issues.  Review of Systems - otherwise negative  Objective: Vital Signs: Blood pressure 131/65, pulse 80, temperature 98.3 F (36.8 C), temperature source Oral, resp. rate 16, weight 83 kg (182 lb 15.7 oz), SpO2 100.00%. Dg Clavicle Left  05/23/2014   CLINICAL DATA:  Postop  EXAM: LEFT CLAVICLE - 2+ VIEWS  COMPARISON:  04/27/2014  FINDINGS: Two views of the left clavicle submitted. Again noted internal fixation of left clavicular fracture with the metallic plate and screws. There is anatomic alignment.  IMPRESSION: Status post internal fixation of left clavicular fracture with metallic plate and screws in place. There is anatomic alignment.   Electronically Signed   By: Lahoma Crocker M.D.   On: 05/23/2014 08:12   No results found for this basename: WBC, HGB, HCT, PLT,  in the last 72 hours No results found for this basename: NA, K, CL, CO, GLUCOSE, BUN, CREATININE, CALCIUM,  in the last 72 hours CBG (last 3)  No results found for this basename: GLUCAP,  in the last 72 hours  Wt Readings from Last 3 Encounters:  05/23/14 83 kg (182 lb 15.7 oz)  04/30/14 83.416 kg (183 lb 14.4 oz)  04/30/14 83.416 kg (183 lb 14.4 oz)    Physical Exam:  Nursing note and vitals reviewed.  Constitutional: He appears well-developed and well-nourished.oriented to person, hospital, not [facility], "october, Friday" HENT:  Head: Normocephalic.  Neck:  Supple. .  Cardiovascular: Regular rhythm.  Respiratory: clear and unlabored  GI: Soft. Bowel sounds are normal. He exhibits no distension.  Musculoskeletal: He exhibits no edema.  Skin: abrasions healing Neurological:   Confused. More attentive.   Initiates more    Assessment/Plan: 1. Functional deficits secondary to TBI, polytrauma which require 3+ hours per day of interdisciplinary therapy in a comprehensive inpatient rehab setting. Physiatrist is providing  close team supervision and 24 hour management of active medical problems listed below. Physiatrist and rehab team continue to assess barriers to discharge/monitor patient progress toward functional and medical goals.  To SNF today  FIM: FIM - Bathing Bathing Steps Patient Completed: Chest;Right Arm;Left Arm;Abdomen;Front perineal area;Buttocks;Right upper leg;Left upper leg;Right lower leg (including foot);Left lower leg (including foot) Bathing: 5: Supervision: Safety issues/verbal cues  FIM - Upper Body Dressing/Undressing Upper body dressing/undressing steps patient completed: Thread/unthread right sleeve of pullover shirt/dresss;Thread/unthread left sleeve of pullover shirt/dress;Put head through opening of pull over shirt/dress;Pull shirt over trunk Upper body dressing/undressing: 5: Set-up assist to: Obtain clothing/put away FIM - Lower Body Dressing/Undressing Lower body dressing/undressing steps patient completed: Thread/unthread right pants leg;Thread/unthread left pants leg;Don/Doff right sock;Don/Doff left sock;Don/Doff right shoe;Don/Doff left shoe;Fasten/unfasten right shoe;Fasten/unfasten left shoe;Pull pants up/down;Thread/unthread right underwear leg;Thread/unthread left underwear leg;Pull underwear up/down Lower body dressing/undressing: 5: Supervision: Safety issues/verbal cues  FIM - Toileting Toileting steps completed by patient: Adjust clothing prior to toileting;Performs perineal hygiene;Adjust clothing after toileting Toileting Assistive Devices: Grab bar or rail for support Toileting: 4: Steadying assist  FIM - Radio producer Devices: Grab bars Toilet Transfers: 3-To toilet/BSC: Mod A (lift or lower assist);3-From toilet/BSC: Mod A (lift or lower assist)  FIM - Bed/Chair Transfer Bed/Chair Transfer Assistive Devices: Arm rests Bed/Chair Transfer: 5: Supine > Sit: Supervision (verbal cues/safety issues);5: Sit > Supine: Supervision  (verbal cues/safety issues);4: Bed > Chair or W/C: Min A (steadying Pt. > 75%);4: Chair or W/C > Bed: Min A (steadying Pt. > 75%)  FIM - Locomotion: Wheelchair Locomotion: Wheelchair: 0: Activity did not occur FIM - Locomotion: Ambulation Locomotion: Ambulation Assistive Devices: Other (comment) (R HHA) Ambulation/Gait Assistance: 4: Min assist Locomotion: Ambulation: 4: Travels 150 ft or more with minimal assistance (Pt.>75%)  Comprehension Comprehension Mode: Auditory Comprehension: 5-Understands basic 90% of the time/requires cueing < 10% of the time  Expression Expression Mode: Verbal Expression: 4-Expresses basic 75 - 89% of the time/requires cueing 10 - 24% of the time. Needs helper to occlude trach/needs to repeat words.  Social Interaction Social Interaction: 4-Interacts appropriately 75 - 89% of the time - Needs redirection for appropriate language or to initiate interaction.  Problem Solving Problem Solving: 2-Solves basic 25 - 49% of the time - needs direction more than half the time to initiate, plan or complete simple activities  Memory Memory: 2-Recognizes or recalls 25 - 49% of the time/requires cueing 51 - 75% of the time  Medical Problem List and Plan:  1. Functional deficits secondary to TBI , cognition and behavior clearing, still with reduced orientation 2. DVT Prophylaxis/Anticoagulation: Mechanical: Antiembolism stockings, thigh (TED hose) Bilateral lower extremities  Sequential compression devices, below knee Bilateral lower extremities  3. Pain Management: Monitor for outward signs of distress and medicate as appropriate.  4. Mood: Not able to determine at this time. H/o anxiety/depression/withdrawal with recommendation for outpatient treatment 02/2014. Will monitor as mentation improves. LCSW to follow along for evaluation/support when appropriate.  5. Neuropsych: This patient is not capable of making decisions on his own behalf.   -continue Low  Bed  -continue ritalin for improved attention and initiation--dc upon transfer  -tegretol for mood stabilization   -bid seroquel  6. Skin/Wound Care: wounds healing 7. Advanced Cirrhosis of liver/Liver mass?: continue lactulose qid  -MRI 12/2010 with two small suspicious liver lesions with recommendations for follow up at that time. (Had seen Dr. Benson Norway 2012 admisison)   -will need GI follow up as outpt 8. Left clavicle fracture: NWB LUE. Sling for support as able.  9. ABLA:    10. Activation:    Ritalin for attention 11. Vitamin B 12 deficiency  LOS (Days) 23 A FACE TO FACE EVALUATION WAS PERFORMED  Kiearra Oyervides T 05/23/2014 8:31 AM

## 2014-05-23 NOTE — Progress Notes (Signed)
Patient was discharged, transported via [facility] to [facility] in [place]. All belongings packed and sent with patient, and information packet sent with patient.   Reported to [facility], all questions answered.

## 2014-05-23 NOTE — Progress Notes (Addendum)
Patient ID: BALTAZAR PEKALA, male   DOB: 07/14/1956, 58 y.o.   MRN: [id number]     Subjective:  Patient reports pain as mild.  Patient sitting in bed with no sling and has been full weight bearing.  Objective:   VITALS:   Filed Vitals:   05/22/14 0506 05/22/14 1437 05/22/14 2001 05/23/14 0624  BP: 100/74 138/78 128/72 131/65  Pulse: 78 88 84 80  Temp: 97.7 F (36.5 C) 98 F (36.7 C) 97.9 F (36.6 C) 98.3 F (36.8 C)  TempSrc: Oral Oral Oral Oral  Resp: 18 14 16 16   Weight: 83 kg (182 lb 15.7 oz)   83 kg (182 lb 15.7 oz)  SpO2: 97% 100% 99% 100%    ABD soft Sensation intact distally Dorsiflexion/Plantar flexion intact Incision: no drainage Patient is moving the arm freely and has no pain ROM 0-160 of forward flexion  Lab Results  Component Value Date   WBC 5.5 05/01/2014   HGB 10.4* 05/01/2014   HCT 30.8* 05/01/2014   MCV 96.6 05/01/2014   PLT 204 05/01/2014   BMET    Component Value Date/Time   NA 135* 05/01/2014 0520   K 4.3 05/01/2014 0520   CL 103 05/01/2014 0520   CO2 24 05/01/2014 0520   GLUCOSE 96 05/01/2014 0520   BUN 8 05/01/2014 0520   CREATININE 0.43* 05/01/2014 0520   CALCIUM 8.5 05/01/2014 0520   GFRNONAA >90 05/01/2014 0520   GFRAA >90 05/01/2014 0520     Assessment/Plan:     Principal Problem:   TBI (traumatic brain injury) Active Problems:   Cirrhosis of liver   Acute blood loss anemia   Multiple fractures of ribs of left side   Fracture of left clavicle   Advance diet Up with therapy WBAT LUE, but  no lifting/weights with left upper ext Continue plan per CIR Follow up in 2 weekswith Dr Dennard Schaumann, BR[place] 05/23/2014, 10:20 AM   Marchia Bond, MD Cell [phone]

## 2014-05-28 NOTE — Progress Notes (Signed)
Chart and note reviewed. Agree with note.  Froilan Mclean, RD, LDN, CNSC Pager [phone] After Hours Pager [phone]   

## 2014-06-25 ENCOUNTER — Encounter: Payer: Medicaid Other | Attending: Physical Medicine & Rehabilitation | Admitting: Physical Medicine & Rehabilitation

## 2014-06-25 ENCOUNTER — Encounter: Payer: Self-pay | Admitting: Physical Medicine & Rehabilitation

## 2014-06-25 VITALS — BP 144/77 | HR 71 | Resp 14

## 2014-06-25 DIAGNOSIS — R4189 Other symptoms and signs involving cognitive functions and awareness: Secondary | ICD-10-CM | POA: Diagnosis not present

## 2014-06-25 DIAGNOSIS — K7460 Unspecified cirrhosis of liver: Secondary | ICD-10-CM | POA: Insufficient documentation

## 2014-06-25 DIAGNOSIS — S069X5D Unspecified intracranial injury with loss of consciousness greater than 24 hours with return to pre-existing conscious level, subsequent encounter: Secondary | ICD-10-CM

## 2014-06-25 DIAGNOSIS — K7030 Alcoholic cirrhosis of liver without ascites: Secondary | ICD-10-CM

## 2014-06-25 DIAGNOSIS — Z87820 Personal history of traumatic brain injury: Secondary | ICD-10-CM | POA: Diagnosis not present
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
## 2014-06-25 NOTE — Patient Instructions (Addendum)
Assessment/Plan:  1. Functional deficits secondary to TBI , cognition and behavior clearing  3. Pain Management: minimize oxycodone use. Try to use only tylenol when needed.  4. Mood/cognition: wean tegretol and seroquel  -week 1 stop seroquel  -week 2 decrease tegretol to BID  -week 3 decrease tegretol to QHS onlye  -week 4+ stop tegretol  -PLEASE LIMIT HALDOL AND ATIVAN USE!!!!!!    -consider checking urinalysis and urine culture (urine with malodorous smell today) 5. Neuropsych: This patient is not capable of making decisions on his own behalf.   7. Advanced Cirrhosis of liver/Liver mass?: continue lactulose qid---needs to see GI regarding -MRI 12/2010 with two small suspicious liver lesions--Has seen Dr. Benson Norway in the past  Follow up with me in 3 months.

## 2014-07-05 ENCOUNTER — Emergency Department (HOSPITAL_COMMUNITY): Payer: Medicaid Other

## 2014-07-05 ENCOUNTER — Inpatient Hospital Stay (HOSPITAL_COMMUNITY)
Admission: EM | Admit: 2014-07-05 | Discharge: 2014-07-18 | DRG: 442 | Disposition: A | Discharge status: Skilled Nursing Facility | Payer: Medicaid Other | Attending: Internal Medicine | Admitting: Internal Medicine

## 2014-07-05 ENCOUNTER — Encounter (HOSPITAL_COMMUNITY): Payer: Self-pay | Admitting: *Deleted

## 2014-07-05 DIAGNOSIS — S069X9A Unspecified intracranial injury with loss of consciousness of unspecified duration, initial encounter: Secondary | ICD-10-CM | POA: Diagnosis present

## 2014-07-05 DIAGNOSIS — Z87820 Personal history of traumatic brain injury: Secondary | ICD-10-CM

## 2014-07-05 DIAGNOSIS — Z87828 Personal history of other (healed) physical injury and trauma: Secondary | ICD-10-CM

## 2014-07-05 DIAGNOSIS — R451 Restlessness and agitation: Secondary | ICD-10-CM | POA: Diagnosis present

## 2014-07-05 DIAGNOSIS — K567 Ileus, unspecified: Secondary | ICD-10-CM

## 2014-07-05 DIAGNOSIS — F17210 Nicotine dependence, cigarettes, uncomplicated: Secondary | ICD-10-CM | POA: Diagnosis present

## 2014-07-05 DIAGNOSIS — D689 Coagulation defect, unspecified: Secondary | ICD-10-CM | POA: Diagnosis present

## 2014-07-05 DIAGNOSIS — R4182 Altered mental status, unspecified: Secondary | ICD-10-CM | POA: Diagnosis present

## 2014-07-05 DIAGNOSIS — K7290 Hepatic failure, unspecified without coma: Principal | ICD-10-CM | POA: Diagnosis present

## 2014-07-05 DIAGNOSIS — G9340 Encephalopathy, unspecified: Secondary | ICD-10-CM

## 2014-07-05 DIAGNOSIS — D72819 Decreased white blood cell count, unspecified: Secondary | ICD-10-CM | POA: Diagnosis not present

## 2014-07-05 DIAGNOSIS — S069XAA Unspecified intracranial injury with loss of consciousness status unknown, initial encounter: Secondary | ICD-10-CM | POA: Diagnosis present

## 2014-07-05 DIAGNOSIS — K7682 Hepatic encephalopathy: Secondary | ICD-10-CM | POA: Insufficient documentation

## 2014-07-05 DIAGNOSIS — F1021 Alcohol dependence, in remission: Secondary | ICD-10-CM | POA: Diagnosis present

## 2014-07-05 DIAGNOSIS — F1020 Alcohol dependence, uncomplicated: Secondary | ICD-10-CM

## 2014-07-05 DIAGNOSIS — R188 Other ascites: Secondary | ICD-10-CM | POA: Insufficient documentation

## 2014-07-05 DIAGNOSIS — D696 Thrombocytopenia, unspecified: Secondary | ICD-10-CM | POA: Diagnosis present

## 2014-07-05 DIAGNOSIS — C220 Liver cell carcinoma: Secondary | ICD-10-CM

## 2014-07-05 DIAGNOSIS — B1920 Unspecified viral hepatitis C without hepatic coma: Secondary | ICD-10-CM | POA: Diagnosis present

## 2014-07-05 DIAGNOSIS — R160 Hepatomegaly, not elsewhere classified: Secondary | ICD-10-CM | POA: Diagnosis present

## 2014-07-05 DIAGNOSIS — K7460 Unspecified cirrhosis of liver: Secondary | ICD-10-CM | POA: Diagnosis present

## 2014-07-05 DIAGNOSIS — Z79891 Long term (current) use of opiate analgesic: Secondary | ICD-10-CM

## 2014-07-05 DIAGNOSIS — Z79899 Other long term (current) drug therapy: Secondary | ICD-10-CM

## 2014-07-05 HISTORY — DX: Hepatomegaly, not elsewhere classified: R16.0

## 2014-07-05 HISTORY — DX: Unspecified multiple injuries, initial encounter: T07.XXXA

## 2014-07-05 LAB — CBC
HEMATOCRIT: 39.8 % (ref 39.0–52.0)
HEMOGLOBIN: 13.7 g/dL (ref 13.0–17.0)
MCH: 31.9 pg (ref 26.0–34.0)
MCHC: 34.4 g/dL (ref 30.0–36.0)
MCV: 92.8 fL (ref 78.0–100.0)
Platelets: 141 10*3/uL — ABNORMAL LOW (ref 150–400)
RBC: 4.29 MIL/uL (ref 4.22–5.81)
RDW: 13.9 % (ref 11.5–15.5)
WBC: 4.3 10*3/uL (ref 4.0–10.5)

## 2014-07-05 LAB — PROTIME-INR
INR: 1.14 (ref 0.00–1.49)
PROTHROMBIN TIME: 14.7 s (ref 11.6–15.2)

## 2014-07-05 LAB — COMPREHENSIVE METABOLIC PANEL
ALT: 37 U/L (ref 0–53)
ANION GAP: 11 (ref 5–15)
AST: 53 U/L — ABNORMAL HIGH (ref 0–37)
Albumin: 2.9 g/dL — ABNORMAL LOW (ref 3.5–5.2)
Alkaline Phosphatase: 102 U/L (ref 39–117)
BUN: 9 mg/dL (ref 6–23)
CALCIUM: 9.3 mg/dL (ref 8.4–10.5)
CO2: 21 mEq/L (ref 19–32)
Chloride: 107 mEq/L (ref 96–112)
Creatinine, Ser: 0.59 mg/dL (ref 0.50–1.35)
GFR calc non Af Amer: >90 mL/min (ref >90)
GLUCOSE: 90 mg/dL (ref 70–99)
POTASSIUM: 4.3 meq/L (ref 3.7–5.3)
SODIUM: 139 meq/L (ref 137–147)
Total Bilirubin: 0.7 mg/dL (ref 0.3–1.2)
Total Protein: 8.1 g/dL (ref 6.0–8.3)

## 2014-07-05 LAB — URINALYSIS, ROUTINE W REFLEX MICROSCOPIC
Glucose, UA: NEGATIVE mg/dL
KETONES UR: 15 mg/dL — AB
Leukocytes, UA: NEGATIVE
NITRITE: NEGATIVE
PROTEIN: NEGATIVE mg/dL
Specific Gravity, Urine: 1.03 (ref 1.005–1.030)
UROBILINOGEN UA: 1 mg/dL (ref 0.0–1.0)
pH: 5.5 (ref 5.0–8.0)

## 2014-07-05 LAB — URINE MICROSCOPIC-ADD ON

## 2014-07-05 LAB — AMMONIA: AMMONIA: 61 umol/L — AB (ref 11–60)

## 2014-07-05 LAB — APTT: APTT: 32 s (ref 24–37)

## 2014-07-05 MED ORDER — DEXTROSE-NACL 5-0.45 % IV SOLN: INTRAVENOUS | Status: DC

## 2014-07-05 MED ORDER — FOLIC ACID 1 MG PO TABS
1.0000 mg | ORAL_TABLET | Freq: Every day | ORAL | Status: DC
  Administered 2014-07-06 – 2014-07-18 (×13): 1 mg via ORAL
  Filled 2014-07-05 (×13): qty 1

## 2014-07-05 MED ORDER — VITAMIN B-1 100 MG PO TABS: 100.0000 mg | ORAL_TABLET | Freq: Every day | ORAL | Status: DC

## 2014-07-05 MED ORDER — PANTOPRAZOLE SODIUM 40 MG PO TBEC
40.0000 mg | DELAYED_RELEASE_TABLET | Freq: Every day | ORAL | Status: DC
  Administered 2014-07-06 – 2014-07-17 (×11): 40 mg via ORAL
  Filled 2014-07-05 (×11): qty 1

## 2014-07-05 MED ORDER — LACTULOSE 10 GM/15ML PO SOLN
30.0000 g | Freq: Once | ORAL | Status: AC
  Administered 2014-07-05: 30 g via ORAL
  Filled 2014-07-05: qty 45

## 2014-07-05 MED ORDER — ADULT MULTIVITAMIN W/MINERALS CH
1.0000 | ORAL_TABLET | Freq: Every day | ORAL | Status: DC
  Administered 2014-07-06 – 2014-07-18 (×13): 1 via ORAL
  Filled 2014-07-05 (×13): qty 1

## 2014-07-05 MED ORDER — ENOXAPARIN SODIUM 40 MG/0.4ML ~~LOC~~ SOLN
40.0000 mg | SUBCUTANEOUS | Status: DC
  Administered 2014-07-06 – 2014-07-17 (×9): 40 mg via SUBCUTANEOUS
  Filled 2014-07-05 (×13): qty 0.4

## 2014-07-05 MED ORDER — ONDANSETRON HCL 4 MG PO TABS: 4.0000 mg | ORAL_TABLET | Freq: Four times a day (QID) | ORAL | Status: DC | PRN

## 2014-07-05 MED ORDER — PANTOPRAZOLE SODIUM 40 MG PO TBEC: 40.0000 mg | DELAYED_RELEASE_TABLET | Freq: Every day | ORAL | Status: DC

## 2014-07-05 MED ORDER — ACETAMINOPHEN 325 MG PO TABS: 325.0000 mg | ORAL_TABLET | ORAL | Status: DC | PRN

## 2014-07-05 MED ORDER — HALOPERIDOL LACTATE 5 MG/ML IJ SOLN
2.0000 mg | Freq: Four times a day (QID) | INTRAMUSCULAR | Status: DC | PRN
  Administered 2014-07-05 – 2014-07-17 (×13): 2 mg via INTRAMUSCULAR
  Filled 2014-07-05 (×13): qty 1

## 2014-07-05 MED ORDER — POLYETHYLENE GLYCOL 3350 17 G PO PACK
17.0000 g | PACK | Freq: Every day | ORAL | Status: DC
  Administered 2014-07-06 – 2014-07-18 (×12): 17 g via ORAL
  Filled 2014-07-05 (×13): qty 1

## 2014-07-05 MED ORDER — VITAMIN B-1 100 MG PO TABS
100.0000 mg | ORAL_TABLET | Freq: Every day | ORAL | Status: DC
  Administered 2014-07-06 – 2014-07-11 (×6): 100 mg via ORAL
  Filled 2014-07-05 (×6): qty 1

## 2014-07-05 MED ORDER — ONDANSETRON HCL 4 MG/2ML IJ SOLN
4.0000 mg | Freq: Four times a day (QID) | INTRAMUSCULAR | Status: DC | PRN
Start: 2014-07-05 — End: 2014-07-18

## 2014-07-05 MED ORDER — FOLIC ACID 1 MG PO TABS: 1.0000 mg | ORAL_TABLET | Freq: Every day | ORAL | Status: DC

## 2014-07-05 MED ORDER — LACTULOSE 10 GM/15ML PO SOLN
45.0000 g | Freq: Three times a day (TID) | ORAL | Status: DC
  Administered 2014-07-05 – 2014-07-06 (×2): 45 g via ORAL
  Filled 2014-07-05 (×6): qty 90

## 2014-07-05 NOTE — ED Notes (Signed)
Attempted report 

## 2014-07-05 NOTE — ED Notes (Signed)
Patient cussing and spitting at staff.

## 2014-07-05 NOTE — ED Notes (Signed)
Disoriented, but can follow simple commands.  Hx.of acute delirium. Seen on 29/5 for metabolic encephalopathy. Alert to name, dob. Pt. Knows he is at a hospital.
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
## 2014-07-05 NOTE — ED Notes (Signed)
Two staff members have attempted IV stick without success; Rn attempting IV with U/S at this time.

## 2014-07-05 NOTE — ED Notes (Signed)
Patient continues to try to get out of bed. Patient redirected and high fall risk interventions are in place.
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
## 2014-07-06 DIAGNOSIS — K7290 Hepatic failure, unspecified without coma: Secondary | ICD-10-CM | POA: Diagnosis present

## 2014-07-06 DIAGNOSIS — D72819 Decreased white blood cell count, unspecified: Secondary | ICD-10-CM | POA: Diagnosis not present

## 2014-07-06 DIAGNOSIS — Z79899 Other long term (current) drug therapy: Secondary | ICD-10-CM | POA: Diagnosis not present

## 2014-07-06 DIAGNOSIS — R451 Restlessness and agitation: Secondary | ICD-10-CM | POA: Diagnosis present

## 2014-07-06 DIAGNOSIS — R4182 Altered mental status, unspecified: Secondary | ICD-10-CM | POA: Diagnosis present

## 2014-07-06 DIAGNOSIS — R410 Disorientation, unspecified: Secondary | ICD-10-CM

## 2014-07-06 DIAGNOSIS — K7460 Unspecified cirrhosis of liver: Secondary | ICD-10-CM | POA: Diagnosis present

## 2014-07-06 DIAGNOSIS — D696 Thrombocytopenia, unspecified: Secondary | ICD-10-CM | POA: Diagnosis present

## 2014-07-06 DIAGNOSIS — F17210 Nicotine dependence, cigarettes, uncomplicated: Secondary | ICD-10-CM | POA: Diagnosis present

## 2014-07-06 DIAGNOSIS — F1021 Alcohol dependence, in remission: Secondary | ICD-10-CM | POA: Diagnosis present

## 2014-07-06 DIAGNOSIS — Z79891 Long term (current) use of opiate analgesic: Secondary | ICD-10-CM | POA: Diagnosis not present

## 2014-07-06 DIAGNOSIS — G9340 Encephalopathy, unspecified: Secondary | ICD-10-CM | POA: Diagnosis present

## 2014-07-06 DIAGNOSIS — Z87820 Personal history of traumatic brain injury: Secondary | ICD-10-CM | POA: Diagnosis not present

## 2014-07-06 DIAGNOSIS — D689 Coagulation defect, unspecified: Secondary | ICD-10-CM | POA: Diagnosis present

## 2014-07-06 DIAGNOSIS — R160 Hepatomegaly, not elsewhere classified: Secondary | ICD-10-CM | POA: Diagnosis present

## 2014-07-06 LAB — MRSA PCR SCREENING: MRSA BY PCR: NEGATIVE

## 2014-07-06 LAB — AMMONIA: AMMONIA: 65 umol/L — AB (ref 11–60)

## 2014-07-06 LAB — VITAMIN B12: VITAMIN B 12: 876 pg/mL (ref 211–911)

## 2014-07-06 LAB — TSH: TSH: 1.53 u[IU]/mL (ref 0.350–4.500)

## 2014-07-06 MED ORDER — LACTULOSE 10 GM/15ML PO SOLN
45.0000 g | Freq: Four times a day (QID) | ORAL | Status: DC
  Administered 2014-07-06 – 2014-07-08 (×7): 45 g via ORAL
  Filled 2014-07-06 (×11): qty 90

## 2014-07-06 MED ORDER — QUETIAPINE FUMARATE 25 MG PO TABS
25.0000 mg | ORAL_TABLET | Freq: Every day | ORAL | Status: DC
  Administered 2014-07-06 – 2014-07-17 (×12): 25 mg via ORAL
  Filled 2014-07-06 (×13): qty 1

## 2014-07-06 MED ORDER — CARBAMAZEPINE 200 MG PO TABS
200.0000 mg | ORAL_TABLET | Freq: Two times a day (BID) | ORAL | Status: DC
  Administered 2014-07-06 – 2014-07-18 (×24): 200 mg via ORAL
  Filled 2014-07-06 (×26): qty 1

## 2014-07-06 NOTE — Plan of Care (Signed)
Problem: Phase II Progression Outcomes Goal: Progress activity as tolerated unless otherwise ordered Outcome: Progressing     

## 2014-07-06 NOTE — Plan of Care (Signed)
Problem: Phase I Progression Outcomes Goal: OOB as tolerated unless otherwise ordered Outcome: Progressing     

## 2014-07-06 NOTE — Progress Notes (Signed)
Day RN stated that IV team was unable to get an IV in patient- MD was made aware and changes were made to medication so that everything would be given by mouth. Will continue to monitor

## 2014-07-06 NOTE — Plan of Care (Signed)
Problem: Phase I Progression Outcomes Goal: Pain controlled with appropriate interventions Outcome: Not Applicable Date Met:  00/97/94

## 2014-07-06 NOTE — Plan of Care (Signed)
Problem: Phase I Progression Outcomes Goal: Voiding-avoid urinary catheter unless indicated Outcome: Progressing     

## 2014-07-06 NOTE — Progress Notes (Signed)
UR completed 
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
## 2014-07-06 NOTE — Progress Notes (Signed)
Unable to restart a PIV on this pt. Pt is refusing and pacing the floor. I informed Museum/gallery curator regarding this, she will contact the MD for further direction. Catalina Pizza

## 2014-07-07 DIAGNOSIS — G9340 Encephalopathy, unspecified: Secondary | ICD-10-CM

## 2014-07-07 MED ORDER — HALOPERIDOL LACTATE 5 MG/ML IJ SOLN
2.0000 mg | Freq: Once | INTRAMUSCULAR | Status: DC
  Filled 2014-07-07: qty 1

## 2014-07-07 NOTE — Plan of Care (Signed)
Problem: Phase I Progression Outcomes Goal: Other Phase I Outcomes/Goals Outcome: Not Applicable Date Met:  75/42/37  Problem: Phase II Progression Outcomes Goal: Other Phase II Outcomes/Goals Outcome: Not Applicable Date Met:  02/30/17  Problem: Phase III Progression Outcomes Goal: Other Phase III Outcomes/Goals Outcome: Not Applicable Date Met:  20/91/06  Problem: Discharge Progression Outcomes Goal: Other Discharge Outcomes/Goals Outcome: Not Applicable Date Met:  81/66/19

## 2014-07-07 NOTE — Progress Notes (Signed)
Summoned to pt's room. Pt was noted OOB and  washing his body and face on sink.  Pt refused to go back to bed  And lift his leg to wash it over the sink. Pt was hardly taken to bed with assistance of 2-3 people. Paged to On-call  Provider and made aware.will monitor.

## 2014-07-07 NOTE — Progress Notes (Signed)
Pt is constantly getting OOB and failed multiple attempts to reorient . Prn IM haldol given with no effect. Pt refused his sch  Meds.  On- call provider Fredirick Maudlin, notified  and asked if we can put him on restraints for safety. Will cont to monitor.

## 2014-07-07 NOTE — Clinical Social Work Note (Signed)
CSW received consult to follow-up with patient's significant other, [name]. CSW made a visit to patient's room, no one present at bedside. CSW contacted [name] [phone], [phone], message left for follow-up. CSW to follow tomorrow.  [place], [place] Weekend Clinical Social Worker [phone]

## 2014-07-07 NOTE — Progress Notes (Signed)
Patient has not urinated since about 10pm last night. Patient was asked a few times if he needed to use the restroom but he replied no. Patient was bladder scanned this am- residual only 130Jocelyn Lamer, [place], witnessed. Will pass on to day nurse.

## 2014-07-07 NOTE — Progress Notes (Signed)
Pt is yelling and trying to get OOB despite is on bilateral wrist restraints. Pt is spitting towards staffs, trying to break siderails with legs and kicking  table and other stuffs with legs. Pt is verbally abusive. oncall Provider T Rogue Bussing paged and asked if we can put him on 4 point restraints for safety. Order received.
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
## 2014-07-08 LAB — COMPREHENSIVE METABOLIC PANEL
ALBUMIN: 2.6 g/dL — AB (ref 3.5–5.2)
ALT: 35 U/L (ref 0–53)
ANION GAP: 12 (ref 5–15)
AST: 60 U/L — ABNORMAL HIGH (ref 0–37)
Alkaline Phosphatase: 90 U/L (ref 39–117)
BUN: 10 mg/dL (ref 6–23)
CHLORIDE: 104 meq/L (ref 96–112)
CO2: 21 mEq/L (ref 19–32)
Calcium: 8.9 mg/dL (ref 8.4–10.5)
Creatinine, Ser: 0.46 mg/dL — ABNORMAL LOW (ref 0.50–1.35)
Glucose, Bld: 111 mg/dL — ABNORMAL HIGH (ref 70–99)
POTASSIUM: 4.1 meq/L (ref 3.7–5.3)
Sodium: 137 mEq/L (ref 137–147)
TOTAL PROTEIN: 7.1 g/dL (ref 6.0–8.3)
Total Bilirubin: 0.7 mg/dL (ref 0.3–1.2)

## 2014-07-08 LAB — AMMONIA: Ammonia: 160 umol/L — ABNORMAL HIGH (ref 11–60)

## 2014-07-08 LAB — CBC
HCT: 39.2 % (ref 39.0–52.0)
HEMOGLOBIN: 13.4 g/dL (ref 13.0–17.0)
MCH: 31.5 pg (ref 26.0–34.0)
MCHC: 34.2 g/dL (ref 30.0–36.0)
MCV: 92.2 fL (ref 78.0–100.0)
Platelets: 110 10*3/uL — ABNORMAL LOW (ref 150–400)
RBC: 4.25 MIL/uL (ref 4.22–5.81)
RDW: 13.5 % (ref 11.5–15.5)
WBC: 3.1 10*3/uL — AB (ref 4.0–10.5)

## 2014-07-08 MED ORDER — LORAZEPAM 2 MG/ML IJ SOLN: 0.5000 mg | Freq: Four times a day (QID) | INTRAMUSCULAR | Status: DC | PRN

## 2014-07-08 MED ORDER — LACTULOSE 10 GM/15ML PO SOLN
30.0000 g | Freq: Three times a day (TID) | ORAL | Status: DC
  Administered 2014-07-08 – 2014-07-09 (×3): 30 g via ORAL
  Filled 2014-07-08 (×7): qty 45

## 2014-07-08 MED ORDER — RIFAXIMIN 550 MG PO TABS
550.0000 mg | ORAL_TABLET | Freq: Two times a day (BID) | ORAL | Status: DC
  Administered 2014-07-08 – 2014-07-18 (×21): 550 mg via ORAL
  Filled 2014-07-08 (×23): qty 1
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
## 2014-07-08 NOTE — Plan of Care (Signed)
Problem: Phase I Progression Outcomes Goal: Voiding-avoid urinary catheter unless indicated Outcome: Completed/Met Date Met:  07/08/14  Problem: Phase II Progression Outcomes Goal: Obtain order to discontinue catheter if appropriate Outcome: Not Applicable Date Met:  79/39/68  Problem: Phase III Progression Outcomes Goal: Foley discontinued Outcome: Not Applicable Date Met:  86/48/47  Problem: Discharge Progression Outcomes Goal: Tolerating diet Outcome: Not Applicable Date Met:  20/72/18

## 2014-07-08 NOTE — Plan of Care (Signed)
Problem: Phase I Progression Outcomes Goal: Hemodynamically stable Outcome: Progressing     

## 2014-07-09 DIAGNOSIS — S069X5D Unspecified intracranial injury with loss of consciousness greater than 24 hours with return to pre-existing conscious level, subsequent encounter: Secondary | ICD-10-CM

## 2014-07-09 DIAGNOSIS — K7682 Hepatic encephalopathy: Secondary | ICD-10-CM | POA: Insufficient documentation

## 2014-07-09 DIAGNOSIS — K7290 Hepatic failure, unspecified without coma: Principal | ICD-10-CM

## 2014-07-09 DIAGNOSIS — R451 Restlessness and agitation: Secondary | ICD-10-CM

## 2014-07-09 LAB — URINE CULTURE
Colony Count: NO GROWTH
Culture: NO GROWTH

## 2014-07-09 MED ORDER — LORAZEPAM 1 MG PO TABS
1.0000 mg | ORAL_TABLET | ORAL | Status: DC | PRN
  Administered 2014-07-09 – 2014-07-16 (×10): 1 mg via ORAL
  Filled 2014-07-09 (×10): qty 1

## 2014-07-09 MED ORDER — HYDRALAZINE HCL 10 MG PO TABS
10.0000 mg | ORAL_TABLET | Freq: Once | ORAL | Status: AC
  Administered 2014-07-09: 10 mg via ORAL
  Filled 2014-07-09: qty 1

## 2014-07-09 MED ORDER — LACTULOSE 10 GM/15ML PO SOLN
30.0000 g | Freq: Four times a day (QID) | ORAL | Status: DC
  Filled 2014-07-09 (×2): qty 45

## 2014-07-09 MED ORDER — LACTULOSE 10 GM/15ML PO SOLN
30.0000 g | Freq: Three times a day (TID) | ORAL | Status: DC
  Administered 2014-07-09 – 2014-07-14 (×15): 30 g via ORAL
  Filled 2014-07-09 (×23): qty 45

## 2014-07-09 MED ORDER — LORAZEPAM 2 MG/ML IJ SOLN
1.0000 mg | INTRAMUSCULAR | Status: DC | PRN
  Administered 2014-07-10 – 2014-07-16 (×10): 1 mg via INTRAMUSCULAR
  Filled 2014-07-09 (×10): qty 1

## 2014-07-09 NOTE — Progress Notes (Signed)
Patient has refused lactulose this afternoon and evening. Patient unable to be convinced of the importance of this medicine.  Roselyn Reef Laquinn Shippy,RN

## 2014-07-09 NOTE — Progress Notes (Signed)
Utilization review complete. Derald Lorge RN CCM Case Mgmt phone [phone] 
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
## 2014-07-09 NOTE — Progress Notes (Signed)
Pt had am BP 174/95 mm of hg. Pt is asymptomatic. On-call provider T callahan, NP paged and as per order 10 mg po Hydralazine given. Rechecked BP is 127/97 mm of hg. Will cont to monitor.

## 2014-07-10 ENCOUNTER — Encounter (HOSPITAL_COMMUNITY): Payer: Self-pay | Admitting: Physician Assistant

## 2014-07-10 ENCOUNTER — Inpatient Hospital Stay (HOSPITAL_COMMUNITY): Payer: Medicaid Other

## 2014-07-10 LAB — CARBAMAZEPINE LEVEL, TOTAL: Carbamazepine Lvl: 7.1 ug/mL (ref 4.0–12.0)

## 2014-07-10 LAB — AMMONIA: AMMONIA: 153 umol/L — AB (ref 11–60)

## 2014-07-10 MED ORDER — LACTULOSE ENEMA
300.0000 mL | Freq: Once | ORAL | Status: DC
  Filled 2014-07-10: qty 300

## 2014-07-10 MED ORDER — LACTULOSE ENEMA
300.0000 mL | Freq: Two times a day (BID) | RECTAL | Status: DC | PRN
Start: 2014-07-10 — End: 2014-07-18
  Administered 2014-07-14 – 2014-07-17 (×5): 300 mL via RECTAL
  Filled 2014-07-10 (×8): qty 300
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
## 2014-07-10 NOTE — Plan of Care (Signed)
Problem: Phase II Progression Outcomes Goal: Discharge plan established Outcome: Completed/Met Date Met:  07/10/14     
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
## 2014-07-11 ENCOUNTER — Inpatient Hospital Stay (HOSPITAL_COMMUNITY): Payer: Medicaid Other

## 2014-07-11 DIAGNOSIS — S069X0D Unspecified intracranial injury without loss of consciousness, subsequent encounter: Secondary | ICD-10-CM

## 2014-07-11 DIAGNOSIS — R160 Hepatomegaly, not elsewhere classified: Secondary | ICD-10-CM

## 2014-07-11 LAB — AMMONIA: Ammonia: 255 umol/L — ABNORMAL HIGH (ref 11–60)

## 2014-07-11 LAB — AFP TUMOR MARKER: AFP-Tumor Marker: 279.8 ng/mL — ABNORMAL HIGH (ref <6.1)

## 2014-07-11 MED ORDER — VITAMIN B-1 100 MG PO TABS
100.0000 mg | ORAL_TABLET | Freq: Every day | ORAL | Status: DC
  Administered 2014-07-14: 100 mg via ORAL
  Filled 2014-07-11: qty 1

## 2014-07-11 MED ORDER — THIAMINE HCL 100 MG/ML IJ SOLN
500.0000 mg | INTRAVENOUS | Status: DC
  Filled 2014-07-11 (×3): qty 5

## 2014-07-11 NOTE — Plan of Care (Signed)
Problem: Phase III Progression Outcomes Goal: Pain controlled on oral analgesia Outcome: Progressing     

## 2014-07-11 NOTE — Progress Notes (Signed)
[facility] Progress Note    Since last GI note: Seems that his mental status is still off from his baseline.  RN tells me that a few days ago he was able to answer questions appropriately, but slowly.  When he was readmitted, he was clearly off from there (disoriented, confused).  Overnight he needed to stay up at RN station, was given ativan for agitation.  Objective: Vital signs in last 24 hours: Temp:  [97.6 F (36.4 C)-98.6 F (37 C)] 97.7 F (36.5 C) (11/11 0533) Pulse Rate:  [67] 67 (11/11 0533) Resp:  [16-18] 18 (11/11 0533) BP: (105-113)/(61-74) 109/62 mmHg (11/11 0533) SpO2:  [100 %] 100 % (11/11 0533) Last BM Date: 07/09/14 General: asleeping at RN station, I did not try to awaken him. Heart: regular rate and rythm Abdomen: soft, non-tender, non-distended, normal bowel sounds   Lab Results:  Recent Labs  07/08/14 1107  WBC 3.1*  HGB 13.4  PLT 110*  MCV 92.2    Recent Labs  07/08/14 1107  NA 137  K 4.1  CL 104  CO2 21  GLUCOSE 111*  BUN 10  CREATININE 0.46*  CALCIUM 8.9    Recent Labs  07/08/14 1107  PROT 7.1  ALBUMIN 2.6*  AST 60*  ALT 35  ALKPHOS 90  BILITOT 0.7    11/10 AFP VERY elevated  Studies/Results: US Abdomen Limited  07/10/2014   CLINICAL DATA:  Encephalopathy.  Evaluate for ascites.  EXAM: LIMITED ABDOMEN ULTRASOUND FOR ASCITES  TECHNIQUE: Limited ultrasound survey for ascites was performed in all four abdominal quadrants.  COMPARISON:  None.  FINDINGS: No ascites identified.  IMPRESSION: Negative exam for ascites   Electronically Signed   By: Kerby Moors M.D.   On: 07/10/2014 15:49     Medications: Scheduled Meds: . carbamazepine  200 mg Oral BID  . enoxaparin (LOVENOX) injection  40 mg Subcutaneous Q24H  . folic acid  1 mg Oral Daily  . lactulose  30 g Oral TID WC & HS  . lactulose  300 mL Rectal Once  . multivitamin with minerals  1 tablet Oral Daily  . pantoprazole  40 mg Oral Daily  . polyethylene  glycol  17 g Oral Daily  . QUEtiapine  25 mg Oral QHS  . rifaximin  550 mg Oral BID  . thiamine  100 mg Oral Daily   Continuous Infusions:  PRN Meds:.acetaminophen, haloperidol lactate, lactulose, LORazepam **OR** LORazepam, ondansetron **OR** ondansetron (ZOFRAN) IV    Assessment/Plan: 58 y.o. male with cirrhosis, traumatic brain injury  Seems that his mental status is still off from his baseline.  He is getting lactulose orally and also twice daily xifaxan.  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 Often this is due to infection and so should check for UTI, pneumonia again (I will order CXR, UA). He has no ascites and so cannot have SBP.  WIth very elevated AFP, he needs repeat evaluation for hepatoma.  MRI 3 years ago was very concerning for hepatoma.  MRI ordered, will obviously not be easy for him to undergo with his MS issues.   Milus Banister, MD  07/11/2014, 7:18 AM [facility] Pager [phone]

## 2014-07-11 NOTE — Progress Notes (Signed)
PROGRESS NOTE  Jose Krueger OZD:[id number] DOB: April 26, 1956 DOA: 07/05/2014 PCP: No primary care provider on file.  Assessment/Plan: Acute Encephalopathy Likely combined TBI and elevated ammonia level. -question a component of Wernicke's encephalopathy -serum B12 876, Tegretol level VII.1, TSH 1.530 -Check HIV antibody, RPR Patient was improved 11/10 - but refused lactulose 2x 11/09  -mental status still not at baseline  CT head negative. U/A not convincing for UTI. Urine culture shows no growth. No evidence of other infectious sources.  -repeat UA and urine culture Consulted [facility] (07/10/2014). -abdominal ultrasound negative for ascites -MRI abdomen ordered by Dr. Ardis Hughs Per notes from [place] patient had intermittent periods of lethargy and agitation. He was placed on Tegretol and Seroquel.  Psychiatry was consulted and evaluated patient on 11/6, his Tegretol and Seroquel have been restarted. Per family patient can usually carry on a conversation fairly well when he is at his baseline. -start high-dose thiamine -07/11/2014 chest x-ray negative Restlessness / Fall risk. Likely a result of TBI and encephalopathy. Required sitter at bedside or restraints. Restraints/Sitter discontinued 11/9 as patient is calm.  Hep C cirrohsis Encephalopathic. Otherwise, Essentially stable regarding LFTs, Coagulopathy, no evident ascites.  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  Hx of Alcoholism No longer drinking. Has been living in SNF.  Liver Mass 3 cm seen on CT 04/11/2014.  MRI ordered by Dr. Ardis Hughs -Alpha-fetoprotein 279.8 -appreciate GI follow-up     Family Communication:   Pt at beside Disposition Plan:   SNF when medically stable       Procedures/Studies: Dg Chest 1 View  07/11/2014   CLINICAL DATA:  Acute encephalopathy  EXAM: CHEST - 1 VIEW  COMPARISON:  07/04/2014  FINDINGS: Cardiomediastinal silhouette is stable. No acute infiltrate or pleural  effusion. Multiple old left rib fractures. Postsurgical changes with metallic fixation plate and screws in left clavicle.  IMPRESSION: No active disease. Old left rib fractures. Postsurgical changes left clavicle.   Electronically Signed   By: Lahoma Crocker M.D.   On: 07/11/2014 08:32   Ct Head Wo Contrast  07/05/2014   CLINICAL DATA:  Altered mental status. Disorientation. Metabolic encephalopathy. History of previous traumatic brain injury in August of this year.  EXAM: CT HEAD WITHOUT CONTRAST  TECHNIQUE: Contiguous axial images were obtained from the base of the skull through the vertex without intravenous contrast.  COMPARISON:  07/04/2014 and previous  FINDINGS: The brain shows mild generalized atrophy. There is no evidence of acute infarction, mass lesion, hemorrhage, hydrocephalus or extra-axial collection. Visualized sinuses are clear. No calvarial abnormality. Sebaceous cyst in the right frontal scalp.  IMPRESSION: No acute finding.  Mild generalized atrophy.   Electronically Signed   By: Nelson Chimes M.D.   On: 07/05/2014 20:10   US Abdomen Limited  07/10/2014   CLINICAL DATA:  Encephalopathy.  Evaluate for ascites.  EXAM: LIMITED ABDOMEN ULTRASOUND FOR ASCITES  TECHNIQUE: Limited ultrasound survey for ascites was performed in all four abdominal quadrants.  COMPARISON:  None.  FINDINGS: No ascites identified.  IMPRESSION: Negative exam for ascites   Electronically Signed   By: Kerby Moors M.D.   On: 07/10/2014 15:49         Subjective: The patient is sleepy but arousable. He is slow to answer, and he answers only in 1 word replies. No reports of respiratory distress, vomiting, uncontrolled pain  Objective: Filed Vitals:   07/10/14 1305 07/10/14 2321 07/11/14 0533 07/11/14 1455  BP:  113/74 105/61 109/62 140/105  Pulse:   67 85  Temp: 97.6 F (36.4 C) 98.6 F (37 C) 97.7 F (36.5 C) 97.6 F (36.4 C)  TempSrc: Oral Oral Oral Oral  Resp: 16 16 18 22   Weight:      SpO2: 100% 100%  100% 100%    Intake/Output Summary (Last 24 hours) at 07/11/14 1703 Last data filed at 07/10/14 1847  Gross per 24 hour  Intake    120 ml  Output      0 ml  Net    120 ml   Weight change:  Exam:   General:  Pt is alert, follows commands appropriately, not in acute distress  HEENT: No icterus, No thrush, No meningismus, [place]/AT  Cardiovascular: RRR, S1/S2, no rubs, no gallops  Respiratory: CTA bilaterally, no wheezing, no crackles, no rhonchi  Abdomen: Soft/+BS, non tender, non distended, no guarding  Extremities: No edema, No lymphangitis, No petechiae, No rashes, no synovitis  Data Reviewed: Basic Metabolic Panel:  Recent Labs Lab 07/05/14 1901 07/08/14 1107  NA 139 137  K 4.3 4.1  CL 107 104  CO2 21 21  GLUCOSE 90 111*  BUN 9 10  CREATININE 0.59 0.46*  CALCIUM 9.3 8.9   Liver Function Tests:  Recent Labs Lab 07/05/14 1901 07/08/14 1107  AST 53* 60*  ALT 37 35  ALKPHOS 102 90  BILITOT 0.7 0.7  PROT 8.1 7.1  ALBUMIN 2.9* 2.6*   No results for input(s): LIPASE, AMYLASE in the last 168 hours.  Recent Labs Lab 07/05/14 1902 07/06/14 0534 07/08/14 1107 07/10/14 0816  AMMONIA 61* 65* 160* 153*   CBC:  Recent Labs Lab 07/05/14 1901 07/08/14 1107  WBC 4.3 3.1*  HGB 13.7 13.4  HCT 39.8 39.2  MCV 92.8 92.2  PLT 141* 110*   Cardiac Enzymes: No results for input(s): CKTOTAL, CKMB, CKMBINDEX, TROPONINI in the last 168 hours. BNP: Invalid input(s): POCBNP CBG: No results for input(s): GLUCAP in the last 168 hours.  Recent Results (from the past 240 hour(s))  Culture, Urine     Status: None   Collection Time: 07/05/14  7:20 PM  Result Value Ref Range Status   Specimen Description URINE, RANDOM  Final   Special Requests ADDED [id number]  Final   Culture  Setup Time   Final    07/07/2014 03:40 Performed at [place] Performed at [facility]   Final   Culture NO GROWTH Performed at [facility]   Final   Report Status 07/09/2014 FINAL  Final  MRSA PCR Screening     Status: None   Collection Time: 07/05/14 11:52 PM  Result Value Ref Range Status   MRSA by PCR NEGATIVE NEGATIVE Final    Comment:        The GeneXpert MRSA Assay (FDA approved for NASAL specimens only), is one component of a comprehensive MRSA colonization surveillance program. It is not intended to diagnose MRSA infection nor to guide or monitor treatment for MRSA infections.      Scheduled Meds: . carbamazepine  200 mg Oral BID  . enoxaparin (LOVENOX) injection  40 mg Subcutaneous Q24H  . folic acid  1 mg Oral Daily  . lactulose  30 g Oral TID WC & HS  . lactulose  300 mL Rectal Once  . multivitamin with minerals  1 tablet Oral Daily  . pantoprazole  40 mg Oral Daily  . polyethylene glycol  17 g Oral Daily  . QUEtiapine  25 mg Oral QHS  . rifaximin  550 mg Oral BID  . thiamine  100 mg Oral Daily   Continuous Infusions:    Silvina Hackleman, DO  [facility] Pager [phone]  If 7PM-7AM, please contact night-coverage [email or web address] Password [email or web address] 07/11/2014, 5:03 PM   LOS: 6 days

## 2014-07-11 NOTE — Plan of Care (Signed)
Problem: Phase III Progression Outcomes Goal: Discharge plan remains appropriate-arrangements made Outcome: Completed/Met Date Met:  07/11/14

## 2014-07-12 DIAGNOSIS — B182 Chronic viral hepatitis C: Secondary | ICD-10-CM

## 2014-07-12 LAB — COMPREHENSIVE METABOLIC PANEL
ALT: 41 U/L (ref 0–53)
AST: 70 U/L — ABNORMAL HIGH (ref 0–37)
Albumin: 2.6 g/dL — ABNORMAL LOW (ref 3.5–5.2)
Alkaline Phosphatase: 93 U/L (ref 39–117)
Anion gap: 12 (ref 5–15)
BILIRUBIN TOTAL: 0.6 mg/dL (ref 0.3–1.2)
BUN: 11 mg/dL (ref 6–23)
CHLORIDE: 106 meq/L (ref 96–112)
CO2: 20 mEq/L (ref 19–32)
CREATININE: 0.52 mg/dL (ref 0.50–1.35)
Calcium: 9.1 mg/dL (ref 8.4–10.5)
GFR calc Af Amer: >90 mL/min (ref >90)
GFR calc non Af Amer: >90 mL/min (ref >90)
Glucose, Bld: 82 mg/dL (ref 70–99)
POTASSIUM: 4.8 meq/L (ref 3.7–5.3)
Sodium: 138 mEq/L (ref 137–147)
Total Protein: 6.9 g/dL (ref 6.0–8.3)

## 2014-07-12 LAB — HIV ANTIBODY (ROUTINE TESTING W REFLEX): HIV 1&2 Ab, 4th Generation: NONREACTIVE

## 2014-07-12 MED ORDER — LACTULOSE ENEMA
300.0000 mL | Freq: Once | ORAL | Status: AC
  Administered 2014-07-12: 300 mL via RECTAL
  Filled 2014-07-12: qty 300

## 2014-07-12 MED ORDER — LORAZEPAM 2 MG/ML IJ SOLN: 1.0000 mg | Freq: Once | INTRAMUSCULAR | Status: DC

## 2014-07-12 NOTE — Clinical Social Work Note (Signed)
CSW continues to follow patient for DC needs. CSW hasa updated facility on patient and will assist with DC when appropriate.  Liz Beach MSW, [place], [place], [phone]

## 2014-07-12 NOTE — Progress Notes (Signed)
PROGRESS NOTE  JAXTON CASALE DXA:[id number] DOB: November 05, 1955 DOA: 07/05/2014 PCP: No primary care provider on file.  Assessment/Plan: Acute Encephalopathy -multifactorial including TBI, hepatic encephalopathy, medication and Wernicke/Korsakoff encephalopathy -high-dose thiamine started 07/11/2014 -serum B12 876, Tegretol level 7.1, TSH 1.530 -Check HIV antibody--negative Patient was improved 11/10 - but refused lactulose 2x 11/09  -mental status not much improved -CT head negative.  -U/A not convincing for UTI. Urine culture shows no growth. No evidence of other infectious sources. -07/11/2014 chest x-ray negative for infiltrates  -repeat UA and urine culture  Consulted [facility] (07/10/2014). -abdominal ultrasound negative for ascites -MRI abdomen ordered by Dr. Ardis Hughs Per notes from [place] patient had intermittent periods of lethargy and agitation. He was placed on Tegretol and Seroquel.  Psychiatry was consulted and evaluated patient on 11/6, his Tegretol and Seroquel have been restarted. Per family patient can usually carry on a conversation but slow when he is at his baseline. -start high-dose thiamine -07/11/2014 chest x-ray negative -07/12/14--no BM-->give lactulose enema-->am ammonia Restlessness / Fall risk. Likely a result of TBI and encephalopathy. Required sitter at bedside or restraints. Restraints/Sitter discontinued 11/9 as patient is calm.  Hep C cirrohsis Encephalopathic. Otherwise, Essentially stable regarding LFTs, Coagulopathy, no evident ascites.  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  Hx of Alcoholism No longer drinking. Has been living in SNF.  Liver Mass 3 cm mass seen on CT 04/11/2014.  MRI ordered by Dr. Ardis Hughs -Alpha-fetoprotein 279.8 -appreciate GI follow-up   Family Communication:   None today Disposition Plan:   Likely SNF       Procedures/Studies: Dg Chest 1 View  07/11/2014   CLINICAL DATA:  Acute  encephalopathy  EXAM: CHEST - 1 VIEW  COMPARISON:  07/04/2014  FINDINGS: Cardiomediastinal silhouette is stable. No acute infiltrate or pleural effusion. Multiple old left rib fractures. Postsurgical changes with metallic fixation plate and screws in left clavicle.  IMPRESSION: No active disease. Old left rib fractures. Postsurgical changes left clavicle.   Electronically Signed   By: Lahoma Crocker M.D.   On: 07/11/2014 08:32   Ct Head Wo Contrast  07/05/2014   CLINICAL DATA:  Altered mental status. Disorientation. Metabolic encephalopathy. History of previous traumatic brain injury in August of this year.  EXAM: CT HEAD WITHOUT CONTRAST  TECHNIQUE: Contiguous axial images were obtained from the base of the skull through the vertex without intravenous contrast.  COMPARISON:  07/04/2014 and previous  FINDINGS: The brain shows mild generalized atrophy. There is no evidence of acute infarction, mass lesion, hemorrhage, hydrocephalus or extra-axial collection. Visualized sinuses are clear. No calvarial abnormality. Sebaceous cyst in the right frontal scalp.  IMPRESSION: No acute finding.  Mild generalized atrophy.   Electronically Signed   By: Nelson Chimes M.D.   On: 07/05/2014 20:10   US Abdomen Limited  07/10/2014   CLINICAL DATA:  Encephalopathy.  Evaluate for ascites.  EXAM: LIMITED ABDOMEN ULTRASOUND FOR ASCITES  TECHNIQUE: Limited ultrasound survey for ascites was performed in all four abdominal quadrants.  COMPARISON:  None.  FINDINGS: No ascites identified.  IMPRESSION: Negative exam for ascites   Electronically Signed   By: Kerby Moors M.D.   On: 07/10/2014 15:49         Subjective: The patient remains confused. He has intermittent episodes of agitation mixed in with intermittent episodes of lucidity. no reports of vomiting, respiratory distress, uncontrolled pain  Objective: Filed Vitals:   07/11/14 0533 07/11/14 1455 07/11/14 2107  07/12/14 0549  BP: 109/62 140/105 111/84 113/64  Pulse:  67 85 80 76  Temp: 97.7 F (36.5 C) 97.6 F (36.4 C) 98.3 F (36.8 C) 98.2 F (36.8 C)  TempSrc: Oral Oral Oral Oral  Resp: 18 22 18 18   Weight:      SpO2: 100% 100% 100% 100%   No intake or output data in the 24 hours ending 07/12/14 1438 Weight change:  Exam:   General:  Pt is alert, follows commands appropriately, not in acute distress  HEENT: No icterus, No thrush, No meningismus, [place]/AT  Cardiovascular: RRR, S1/S2, no rubs, no gallops  Respiratory: CTA bilaterally, no wheezing, no crackles, no rhonchi  Abdomen: Soft/+BS, non tender, non distended, no guarding  Extremities: No edema, No lymphangitis, No petechiae, No rashes, no synovitis  Data Reviewed: Basic Metabolic Panel:  Recent Labs Lab 07/05/14 1901 07/08/14 1107 07/12/14 0442  NA 139 137 138  K 4.3 4.1 4.8  CL 107 104 106  CO2 21 21 20   GLUCOSE 90 111* 82  BUN 9 10 11   CREATININE 0.59 0.46* 0.52  CALCIUM 9.3 8.9 9.1   Liver Function Tests:  Recent Labs Lab 07/05/14 1901 07/08/14 1107 07/12/14 0442  AST 53* 60* 70*  ALT 37 35 41  ALKPHOS 102 90 93  BILITOT 0.7 0.7 0.6  PROT 8.1 7.1 6.9  ALBUMIN 2.9* 2.6* 2.6*   No results for input(s): LIPASE, AMYLASE in the last 168 hours.  Recent Labs Lab 07/05/14 1902 07/06/14 0534 07/08/14 1107 07/10/14 0816 07/11/14 1851  AMMONIA 61* 65* 160* 153* 255*   CBC:  Recent Labs Lab 07/05/14 1901 07/08/14 1107  WBC 4.3 3.1*  HGB 13.7 13.4  HCT 39.8 39.2  MCV 92.8 92.2  PLT 141* 110*   Cardiac Enzymes: No results for input(s): CKTOTAL, CKMB, CKMBINDEX, TROPONINI in the last 168 hours. BNP: Invalid input(s): POCBNP CBG: No results for input(s): GLUCAP in the last 168 hours.  Recent Results (from the past 240 hour(s))  Culture, Urine     Status: None   Collection Time: 07/05/14  7:20 PM  Result Value Ref Range Status   Specimen Description URINE, RANDOM  Final   Special Requests ADDED [id number]  Final   Culture  Setup Time   Final      07/07/2014 03:40 Performed at [place] Performed at [facility]   Final   Culture NO GROWTH Performed at [facility]   Final   Report Status 07/09/2014 FINAL  Final  MRSA PCR Screening     Status: None   Collection Time: 07/05/14 11:52 PM  Result Value Ref Range Status   MRSA by PCR NEGATIVE NEGATIVE Final    Comment:        The GeneXpert MRSA Assay (FDA approved for NASAL specimens only), is one component of a comprehensive MRSA colonization surveillance program. It is not intended to diagnose MRSA infection nor to guide or monitor treatment for MRSA infections.      Scheduled Meds: . carbamazepine  200 mg Oral BID  . enoxaparin (LOVENOX) injection  40 mg Subcutaneous Q24H  . folic acid  1 mg Oral Daily  . lactulose  30 g Oral TID WC & HS  . lactulose  300 mL Rectal Once  . lactulose  300 mL Rectal Once  . multivitamin with minerals  1 tablet Oral Daily  . pantoprazole  40 mg Oral Daily  . polyethylene  glycol  17 g Oral Daily  . QUEtiapine  25 mg Oral QHS  . rifaximin  550 mg Oral BID  . [START ON 07/14/2014] thiamine  100 mg Oral Daily  . thiamine IV  500 mg Intravenous Q24H   Continuous Infusions:    Kadeem Hyle, DO  [facility] Pager [phone]  If 7PM-7AM, please contact night-coverage [email or web address] Password [email or web address] 07/12/2014, 2:38 PM   LOS: 7 days

## 2014-07-12 NOTE — Progress Notes (Signed)
Daily Rounding Note  07/12/2014, 9:59 AM  LOS: 7 days   SUBJECTIVE:       Had 4 BMs so far today, do not see any recorded for 11/11.  Unable to get any answers from pt his is mumbling and not following commands.   OBJECTIVE:         Vital signs in last 24 hours:    Temp:  [97.6 F (36.4 C)-98.3 F (36.8 C)] 98.2 F (36.8 C) (11/12 0549) Pulse Rate:  [76-85] 76 (11/12 0549) Resp:  [18-22] 18 (11/12 0549) BP: (111-140)/(64-105) 113/64 mmHg (11/12 0549) SpO2:  [100 %] 100 % (11/12 0549) Last BM Date: 07/09/14 Filed Weights   07/05/14 2236  Weight: 171 lb 15.3 oz (78 kg)   General: mumbling, folded forward in bed in awkward position.   Heart: RRR Chest: clear bil.  Not breathing deeply despite request.  No cough or dyspnea Abdomen: soft, NT, ND.  BS hypoactive  Extremities: no CCE Neuro/Psych:  Harm preventing mittens in place.  Incoherent mumbles.  Not currently agitated rather is obtunded. + asterixis.   Intake/Output from previous day:    Intake/Output this shift:    Lab Results: No results for input(s): WBC, HGB, HCT, PLT in the last 72 hours. BMET  Recent Labs  07/12/14 0442  NA 138  K 4.8  CL 106  CO2 20  GLUCOSE 82  BUN 11  CREATININE 0.52  CALCIUM 9.1   LFT  Recent Labs  07/12/14 0442  PROT 6.9  ALBUMIN 2.6*  AST 70*  ALT 41  ALKPHOS 93  BILITOT 0.6   PT/INR No results for input(s): LABPROT, INR in the last 72 hours. Hepatitis Panel No results for input(s): HEPBSAG, HCVAB, HEPAIGM, HEPBIGM in the last 72 hours.  Studies/Results: Dg Chest 1 View  07/11/2014   CLINICAL DATA:  Acute encephalopathy  EXAM: CHEST - 1 VIEW  COMPARISON:  07/04/2014  FINDINGS: Cardiomediastinal silhouette is stable. No acute infiltrate or pleural effusion. Multiple old left rib fractures. Postsurgical changes with metallic fixation plate and screws in left clavicle.  IMPRESSION: No active disease. Old  left rib fractures. Postsurgical changes left clavicle.   Electronically Signed   By: Lahoma Crocker M.D.   On: 07/11/2014 08:32   US Abdomen Limited  07/10/2014   CLINICAL DATA:  Encephalopathy.  Evaluate for ascites.  EXAM: LIMITED ABDOMEN ULTRASOUND FOR ASCITES  TECHNIQUE: Limited ultrasound survey for ascites was performed in all four abdominal quadrants.  COMPARISON:  None.  FINDINGS: No ascites identified.  IMPRESSION: Negative exam for ascites   Electronically Signed   By: Kerby Moors M.D.   On: 07/10/2014 15:49   Scheduled Meds: . carbamazepine  200 mg Oral BID  . enoxaparin (LOVENOX) injection  40 mg Subcutaneous Q24H  . folic acid  1 mg Oral Daily  . lactulose  30 g Oral TID WC & HS  . lactulose  300 mL Rectal Once  . multivitamin with minerals  1 tablet Oral Daily  . pantoprazole  40 mg Oral Daily  . polyethylene glycol  17 g Oral Daily  . QUEtiapine  25 mg Oral QHS  . rifaximin  550 mg Oral BID  . [START ON 07/14/2014] thiamine  100 mg Oral Daily  . thiamine IV  500 mg Intravenous Q24H   Continuous Infusions:  PRN Meds:.acetaminophen, haloperidol lactate, lactulose, LORazepam **OR** LORazepam, ondansetron **OR** ondansetron (ZOFRAN) IV  ASSESMENT:   * Encephalopathy.  Acute MS had improved following addition of Lactulose and Rifaximin but he is once again confused and nearly obtunded at present.  Recent TBI in 03/2014 confounds assessment, though head CT a few days ago with no acute findings. Also did not take all the doses of Lactulose 11/9. Took all 4 doses of this yesterday along with bid Rifaximin.  Ammonia level jumped to 255 yesterday.  . 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 Chronic ETOH abuse up to 03/2014.  No ascites on ultrasound of 11/10, this test only looked for ascites, did not survey liver, biliary tree etc.   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 AFP 279. Liver  lesions on MRI 2012 suspicious for HCCA as well. AFP then was not significantly elevated.  MRI not ordered by hospitalist team.   * Portal htn and varices. EGD 12/2010 with small fundal varix. Never had variceal bleed.   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   * Slight hyponatremia.   * Thrombocytopenia. No coagulopathy as pt/INR currently normal.   * Leukopenia.     PLAN   *  Needs MRI to evaluate his liver mass. Needs this study to stage the lesion and confirm dx of HCCA.  Probably not gonna be able to perform this in his present state, he is not cooperative at all.  *  Ammonia level in AM.  *  Ativan likely impacting his diminished MS.  Understand he receives this for periods of agitation but makes the neuro/encephalopathy assessments challenging.  As he is at present, looks like a goals of care consult is in order. However would be best if we had the MRI before talking with family/HCPOA.       Azucena Freed  07/12/2014, 9:59 AM Pager: [phone]  ________________________________________________________________________  Velora Heckler GI MD note:  I personally examined the patient, reviewed the data and agree with the assessment and plan described above.  Not sure what is driving his decreased MS.  Hepatic encephalopathy contributes, I'm sure.  CXR yesterday shows no clear pneumonia. UA ordered yesterday, not collected yet.  He has no ascites so very unlikely SBP. Should continue xifaxan 550mg  BID and lactulose orally. Also it is not unreasonable to empirically treat him with broad spect Abx (cipro or other) since infection is often cause of encephalopathy.  Would hold on any meds that can alter his MS (benzos, others) if possible.  He needs MRI of liver to check for [facility] (I am quite suspicious that he does given 2012 MRI and very elevated AFP now).     Owens Loffler, MD [facility] Pager [phone]
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
## 2014-07-12 NOTE — Plan of Care (Signed)
Problem: Phase III Progression Outcomes Goal: Voiding independently Outcome: Completed/Met Date Met:  07/12/14
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
## 2014-07-12 NOTE — Progress Notes (Signed)
Patient refused IV access; unable to administer IV meds. Will continue to monitor.

## 2014-07-13 ENCOUNTER — Inpatient Hospital Stay (HOSPITAL_COMMUNITY): Payer: Medicaid Other

## 2014-07-13 LAB — URINALYSIS, ROUTINE W REFLEX MICROSCOPIC
Glucose, UA: NEGATIVE mg/dL
Ketones, ur: 15 mg/dL — AB
LEUKOCYTES UA: NEGATIVE
Nitrite: NEGATIVE
PROTEIN: NEGATIVE mg/dL
Specific Gravity, Urine: 1.031 — ABNORMAL HIGH (ref 1.005–1.030)
Urobilinogen, UA: 0.2 mg/dL (ref 0.0–1.0)
pH: 5.5 (ref 5.0–8.0)

## 2014-07-13 LAB — BASIC METABOLIC PANEL
Anion gap: 9 (ref 5–15)
BUN: 10 mg/dL (ref 6–23)
CHLORIDE: 107 meq/L (ref 96–112)
CO2: 22 meq/L (ref 19–32)
CREATININE: 0.59 mg/dL (ref 0.50–1.35)
Calcium: 9.1 mg/dL (ref 8.4–10.5)
GFR calc Af Amer: >90 mL/min (ref >90)
GFR calc non Af Amer: >90 mL/min (ref >90)
GLUCOSE: 77 mg/dL (ref 70–99)
Potassium: 4.3 mEq/L (ref 3.7–5.3)
Sodium: 138 mEq/L (ref 137–147)

## 2014-07-13 LAB — URINE MICROSCOPIC-ADD ON

## 2014-07-13 LAB — AMMONIA: Ammonia: 152 umol/L — ABNORMAL HIGH (ref 11–60)

## 2014-07-13 NOTE — Care Management Note (Signed)
    Page 1 of 1   07/18/2014     12:02:55 PM CARE MANAGEMENT NOTE 07/18/2014  Patient:  Jose Krueger, Jose Krueger   Account Number:  [id number]  Date Initiated:  07/13/2014  Documentation initiated by:  Sharolyn Douglas  Subjective/Objective Assessment:     Action/Plan:   Anticipated DC Date:  07/18/2014   Anticipated DC Plan:  SKILLED NURSING FACILITY  In-house referral  Clinical Social Worker      DC Planning Services  CM consult      Choice offered to / List presented to:             Status of service:  Completed, signed off Medicare Important Message given?  NO (If response is "NO", the following Medicare IM given date fields will be blank) Date Medicare IM given:   Medicare IM given by:   Date Additional Medicare IM given:   Additional Medicare IM given by:    Discharge Disposition:  [facility]  Per UR Regulation:  Reviewed for med. necessity/level of care/duration of stay  If discussed at [place] of Stay Meetings, dates discussed:    Comments:  07/18/14 [place], BSN [phone] patient is going back to [facility] today, [facility] following.  07/13/14 kristin mcnally no antciapted weekend dc needs, plan is to snf when medically stable

## 2014-07-13 NOTE — Progress Notes (Signed)
Pt was brought down to MRI for abdomen exam.  These exams require patient cooperation to hold still and hold their breath on command immediately upon request.  This patient was not able to do these things, I consulted with the body reading MRI radiologist, Dr Clovis Riley and he stated it would not be prudent to attempt the exam at this time, either to wait until the patient could fully cooperate or sedate the patient heavily and use CT to evaluate any liver abnormality.  Pt was taken upstairs by transport and sitter was with him.

## 2014-07-13 NOTE — Plan of Care (Signed)
Problem: Phase I Progression Outcomes Goal: OOB as tolerated unless otherwise ordered Outcome: Completed/Met Date Met:  07/13/14

## 2014-07-13 NOTE — Plan of Care (Signed)
Problem: Phase I Progression Outcomes Goal: OOB as tolerated unless otherwise ordered Outcome: Progressing Goal: Initial discharge plan identified Outcome: Completed/Met Date Met:  07/13/14 Goal: Hemodynamically stable Outcome: Completed/Met Date Met:  07/13/14  Problem: Phase II Progression Outcomes Goal: Progress activity as tolerated unless otherwise ordered Outcome: Progressing Goal: Vital signs remain stable Outcome: Completed/Met Date Met:  07/13/14 Goal: IV changed to normal saline lock Outcome: Completed/Met Date Met:  07/13/14  Problem: Phase III Progression Outcomes Goal: Pain controlled on oral analgesia Outcome: Completed/Met Date Met:  07/13/14 Goal: IV/normal saline lock discontinued Outcome: Completed/Met Date Met:  07/13/14  Problem: Discharge Progression Outcomes Goal: Discharge plan in place and appropriate Outcome: Progressing Goal: Pain controlled with appropriate interventions Outcome: Completed/Met Date Met:  07/13/14 Goal: Hemodynamically stable Outcome: Completed/Met Date Met:  07/13/14

## 2014-07-13 NOTE — Progress Notes (Signed)
PROGRESS NOTE  Jose Krueger XKG:[id number] DOB: [birth year] DOA: 07/05/2014 PCP: No primary care provider on file.  Assessment/Plan: Acute Encephalopathy -multifactorial including TBI, hepatic encephalopathy, medication and Wernicke/Korsakoff encephalopathy -high-dose thiamine started 07/11/2014 -serum B12 876, Tegretol level 7.1, TSH 1.530 -Check HIV antibody--negative -mental status not much improved -11/5--CT head negative.  -repeat U/A not convincing for UTI. Urine culture shows no growth. No evidence of other infectious sources. -07/11/2014 chest x-ray negative for infiltrates   -Consulted [facility] (07/10/2014). -abdominal ultrasound negative for ascites -MRI abdomen ordered by Dr. Ardis Hughs Per notes from [facility] patient had intermittent periods of lethargy and agitation. He was placed on Tegretol and Seroquel.  Psychiatry was consulted and evaluated patient on 11/6, his Tegretol and Seroquel have been restarted. Per family patient can usually carry on a conversation but slow when he is at his baseline. -start high-dose thiamine -07/11/2014 chest x-ray negative -07/12/14--no BM-->give lactulose enema-->am ammonia--155 Restlessness / Fall risk. Likely a result of TBI and encephalopathy. Required sitter at bedside or restraints.  -Restraints/Sitter discontinued 11/9 as patient is calm.  Hep C cirrohsis Encephalopathic. Otherwise, Essentially stable regarding LFTs, Coagulopathy, no evident ascites.  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  Hx of Alcoholism No longer drinking. Has been living in SNF.  Liver Mass 3 cm mass seen on CT 04/11/2014.  MRI ordered by Dr. Ardis Hughs -Alpha-fetoprotein 279.8 -appreciate GI follow-up      Procedures/Studies: Dg Chest 1 View  07/11/2014   CLINICAL DATA:  Acute encephalopathy  EXAM: CHEST - 1 VIEW  COMPARISON:  07/04/2014  FINDINGS: Cardiomediastinal silhouette is stable. No acute infiltrate or pleural effusion.  Multiple old left rib fractures. Postsurgical changes with metallic fixation plate and screws in left clavicle.  IMPRESSION: No active disease. Old left rib fractures. Postsurgical changes left clavicle.   Electronically Signed   By: Lahoma Crocker M.D.   On: 07/11/2014 08:32   Ct Head Wo Contrast  07/05/2014   CLINICAL DATA:  Altered mental status. Disorientation. Metabolic encephalopathy. History of previous traumatic brain injury in August of this year.  EXAM: CT HEAD WITHOUT CONTRAST  TECHNIQUE: Contiguous axial images were obtained from the base of the skull through the vertex without intravenous contrast.  COMPARISON:  07/04/2014 and previous  FINDINGS: The brain shows mild generalized atrophy. There is no evidence of acute infarction, mass lesion, hemorrhage, hydrocephalus or extra-axial collection. Visualized sinuses are clear. No calvarial abnormality. Sebaceous cyst in the right frontal scalp.  IMPRESSION: No acute finding.  Mild generalized atrophy.   Electronically Signed   By: Nelson Chimes M.D.   On: 07/05/2014 20:10   US Abdomen Limited  07/10/2014   CLINICAL DATA:  Encephalopathy.  Evaluate for ascites.  EXAM: LIMITED ABDOMEN ULTRASOUND FOR ASCITES  TECHNIQUE: Limited ultrasound survey for ascites was performed in all four abdominal quadrants.  COMPARISON:  None.  FINDINGS: No ascites identified.  IMPRESSION: Negative exam for ascites   Electronically Signed   By: Kerby Moors M.D.   On: 07/10/2014 15:49         Subjective: Patient is more awake today but remains pleasantly confused. He has intermittent episodes of that dictation. No reports of bowel movements today. No reports of respiratory distress, uncontrolled vomiting, uncontrolled pain  Objective: Filed Vitals:   07/12/14 2126 07/12/14 2141 07/13/14 0555 07/13/14 1420  BP: 149/88  122/74 108/80  Pulse: 105  84 83  Temp: 99.1 F (37.3 C)  98.1 F (  36.7 C) 98.2 F (36.8 C)  TempSrc: Oral  Oral Oral  Resp:   18 20    Height:  6' (1.829 m)    Weight:      SpO2: 99%  100% 100%    Intake/Output Summary (Last 24 hours) at 07/13/14 1429 Last data filed at 07/13/14 0915  Gross per 24 hour  Intake    600 ml  Output    200 ml  Net    400 ml   Weight change:  Exam:   General:  Pt is alert, follows commands appropriately, not in acute distress  HEENT: No icterus, No thrush,  [place]/AT  Cardiovascular: RRR, S1/S2, no rubs, no gallops  Respiratory: fine bibasilar crackles. No wheezing.  Abdomen: Soft/+BS, non tender, non distended, no guarding  Extremities: No edema, No lymphangitis, No petechiae, No rashes, no synovitis  Data Reviewed: Basic Metabolic Panel:  Recent Labs Lab 07/08/14 1107 07/12/14 0442 07/13/14 0625  NA 137 138 138  K 4.1 4.8 4.3  CL 104 106 107  CO2 21 20 22   GLUCOSE 111* 82 77  BUN 10 11 10   CREATININE 0.46* 0.52 0.59  CALCIUM 8.9 9.1 9.1   Liver Function Tests:  Recent Labs Lab 07/08/14 1107 07/12/14 0442  AST 60* 70*  ALT 35 41  ALKPHOS 90 93  BILITOT 0.7 0.6  PROT 7.1 6.9  ALBUMIN 2.6* 2.6*   No results for input(s): LIPASE, AMYLASE in the last 168 hours.  Recent Labs Lab 07/08/14 1107 07/10/14 0816 07/11/14 1851 07/13/14 0625  AMMONIA 160* 153* 255* 152*   CBC:  Recent Labs Lab 07/08/14 1107  WBC 3.1*  HGB 13.4  HCT 39.2  MCV 92.2  PLT 110*   Cardiac Enzymes: No results for input(s): CKTOTAL, CKMB, CKMBINDEX, TROPONINI in the last 168 hours. BNP: Invalid input(s): POCBNP CBG: No results for input(s): GLUCAP in the last 168 hours.  Recent Results (from the past 240 hour(s))  Culture, Urine     Status: None   Collection Time: 07/05/14  7:20 PM  Result Value Ref Range Status   Specimen Description URINE, RANDOM  Final   Special Requests ADDED [id number]  Final   Culture  Setup Time   Final    07/07/2014 03:40 Performed at [place] Performed at [facility]   Final   Culture NO  GROWTH Performed at [facility]   Final   Report Status 07/09/2014 FINAL  Final  MRSA PCR Screening     Status: None   Collection Time: 07/05/14 11:52 PM  Result Value Ref Range Status   MRSA by PCR NEGATIVE NEGATIVE Final    Comment:        The GeneXpert MRSA Assay (FDA approved for NASAL specimens only), is one component of a comprehensive MRSA colonization surveillance program. It is not intended to diagnose MRSA infection nor to guide or monitor treatment for MRSA infections.      Scheduled Meds: . carbamazepine  200 mg Oral BID  . enoxaparin (LOVENOX) injection  40 mg Subcutaneous Q24H  . folic acid  1 mg Oral Daily  . lactulose  30 g Oral TID WC & HS  . LORazepam  1 mg Intravenous Once  . multivitamin with minerals  1 tablet Oral Daily  . pantoprazole  40 mg Oral Daily  . polyethylene glycol  17 g Oral Daily  . QUEtiapine  25 mg Oral QHS  . rifaximin  550 mg Oral BID  . [START ON 07/14/2014] thiamine  100 mg Oral Daily  . thiamine IV  500 mg Intravenous Q24H   Continuous Infusions:    Nattalie Santiesteban, DO  [facility] Pager [phone]  If 7PM-7AM, please contact night-coverage [email or web address] Password [email or web address] 07/13/2014, 2:29 PM   LOS: 8 days

## 2014-07-13 NOTE — Progress Notes (Addendum)
[facility] Progress Note    Since last GI note: Staff tells me he ate his BF, was given holdol for agitation.  Currently sleeping, not arousable.  Objective: Vital signs in last 24 hours: Temp:  [97.7 F (36.5 C)-99.1 F (37.3 C)] 98.1 F (36.7 C) (11/13 0555) Pulse Rate:  [84-112] 84 (11/13 0555) Resp:  [18] 18 (11/13 0555) BP: (122-149)/(74-93) 122/74 mmHg (11/13 0555) SpO2:  [99 %-100 %] 100 % (11/13 0555) Last BM Date: 07/12/14 General: alert and oriented times 0 Heart: regular rate and rythm Abdomen: soft, non-tender, non-distended, normal bowel sounds   Lab Results: No results for input(s): WBC, HGB, PLT, MCV in the last 72 hours.  Recent Labs  07/12/14 0442 07/13/14 0625  NA 138 138  K 4.8 4.3  CL 106 107  CO2 20 22  GLUCOSE 82 77  BUN 11 10  CREATININE 0.52 0.59  CALCIUM 9.1 9.1    Recent Labs  07/12/14 0442  PROT 6.9  ALBUMIN 2.6*  AST 70*  ALT 41  ALKPHOS 93  BILITOT 0.6   Medications: Scheduled Meds: . carbamazepine  200 mg Oral BID  . enoxaparin (LOVENOX) injection  40 mg Subcutaneous Q24H  . folic acid  1 mg Oral Daily  . lactulose  30 g Oral TID WC & HS  . LORazepam  1 mg Intravenous Once  . multivitamin with minerals  1 tablet Oral Daily  . pantoprazole  40 mg Oral Daily  . polyethylene glycol  17 g Oral Daily  . QUEtiapine  25 mg Oral QHS  . rifaximin  550 mg Oral BID  . [START ON 07/14/2014] thiamine  100 mg Oral Daily  . thiamine IV  500 mg Intravenous Q24H   Continuous Infusions:  PRN Meds:.acetaminophen, haloperidol lactate, lactulose, LORazepam **OR** LORazepam, ondansetron **OR** ondansetron (ZOFRAN) IV    Assessment/Plan: 58 y.o. male with recent TBI, cirrhosis  I am not certain why his MS is not improving.  He is on BID xifaxin, tid lactulose. No occult infections have been located despite UA, CXR, [place] (no ascites).  I think empiric antibiotics is reasonable, will leave to primary service however.  MRI not  performed yet.  I suspect the 3cm mass in liver, in setting of cirrhosis and elevated AFP is truly a hepatoma.  Imaging with MRI may be diagnostic of hepatoma (obviating need for further workup with biopsy).  If that is the case, oncology will need to be involved.  Obviously DPOA, family input if his is proven to have hepatoma will be essential.     Milus Banister, MD  07/13/2014, 9:53 AM [facility] Pager [phone]

## 2014-07-14 ENCOUNTER — Inpatient Hospital Stay (HOSPITAL_COMMUNITY): Payer: Medicaid Other

## 2014-07-14 LAB — CBC
HCT: 35.7 % — ABNORMAL LOW (ref 39.0–52.0)
Hemoglobin: 12 g/dL — ABNORMAL LOW (ref 13.0–17.0)
MCH: 31.8 pg (ref 26.0–34.0)
MCHC: 33.6 g/dL (ref 30.0–36.0)
MCV: 94.7 fL (ref 78.0–100.0)
Platelets: 117 10*3/uL — ABNORMAL LOW (ref 150–400)
RBC: 3.77 MIL/uL — AB (ref 4.22–5.81)
RDW: 13.9 % (ref 11.5–15.5)
WBC: 3.4 10*3/uL — ABNORMAL LOW (ref 4.0–10.5)

## 2014-07-14 LAB — AMMONIA: AMMONIA: 194 umol/L — AB (ref 11–60)

## 2014-07-14 LAB — COMPREHENSIVE METABOLIC PANEL
ALT: 39 U/L (ref 0–53)
ANION GAP: 9 (ref 5–15)
AST: 75 U/L — ABNORMAL HIGH (ref 0–37)
Albumin: 2.3 g/dL — ABNORMAL LOW (ref 3.5–5.2)
Alkaline Phosphatase: 82 U/L (ref 39–117)
BILIRUBIN TOTAL: 0.4 mg/dL (ref 0.3–1.2)
BUN: 8 mg/dL (ref 6–23)
CALCIUM: 9 mg/dL (ref 8.4–10.5)
CO2: 24 mEq/L (ref 19–32)
CREATININE: 0.61 mg/dL (ref 0.50–1.35)
Chloride: 104 mEq/L (ref 96–112)
Glucose, Bld: 95 mg/dL (ref 70–99)
Potassium: 4.6 mEq/L (ref 3.7–5.3)
Sodium: 137 mEq/L (ref 137–147)
Total Protein: 6.5 g/dL (ref 6.0–8.3)

## 2014-07-14 LAB — URINE CULTURE
CULTURE: NO GROWTH
Colony Count: NO GROWTH

## 2014-07-14 MED ORDER — VITAMIN B-1 100 MG PO TABS
500.0000 mg | ORAL_TABLET | Freq: Three times a day (TID) | ORAL | Status: AC
  Administered 2014-07-14 – 2014-07-17 (×9): 500 mg via ORAL
  Filled 2014-07-14 (×9): qty 5

## 2014-07-14 MED ORDER — LACTULOSE 10 GM/15ML PO SOLN
45.0000 g | Freq: Three times a day (TID) | ORAL | Status: DC
  Administered 2014-07-14 – 2014-07-18 (×14): 45 g via ORAL
  Filled 2014-07-14 (×21): qty 90

## 2014-07-14 MED ORDER — LACTULOSE ENEMA
300.0000 mL | Freq: Once | RECTAL | Status: AC
Start: 2014-07-14 — End: 2014-07-14
  Filled 2014-07-14: qty 300

## 2014-07-14 NOTE — Progress Notes (Signed)
MD notified of fall that occurred this morning. Resident fell on his buttocks,  no signs of injury at this time, skin intact. Will continue to monitor at this time.

## 2014-07-14 NOTE — Plan of Care (Signed)
Problem: Phase II Progression Outcomes Goal: Progress activity as tolerated unless otherwise ordered Outcome: Progressing  Problem: Discharge Progression Outcomes Goal: Discharge plan in place and appropriate Outcome: Progressing

## 2014-07-14 NOTE — Progress Notes (Signed)
PROGRESS NOTE  Jose Krueger:[id number] DOB: 1956-04-07 DOA: 07/05/2014 PCP: No primary care provider on file.  Assessment/Plan: Acute Encephalopathy -multifactorial including TBI, hepatic encephalopathy, medication and Wernicke/Korsakoff encephalopathy -high-dose thiamine started 07/11/2014 but pt did not receive due to lost IV access -will give po thiamine 500mg  tid x 3 days -check thiamine level -serum B12 876, Tegretol level 7.1, TSH 1.530 -Check HIV antibody--negative -mental status not much improved -11/5--CT head negative.  -repeat U/A not convincing for UTI. Urine culture shows no growth. No evidence of other infectious sources. -07/11/2014 chest x-ray negative for infiltrates   -Consulted [facility] (07/10/2014). -abdominal ultrasound negative for ascites -MRI abdomen ordered by Dr. [name] to perform as pt not able to follow commands Per notes from [facility] patient had intermittent periods of lethargy and agitation. He was placed on Tegretol and Seroquel.  Psychiatry was consulted and evaluated patient on 11/6, his Tegretol and Seroquel have been restarted. Per family patient can usually carry on a conversation but slow when he is at his baseline. -07/11/2014 chest x-ray negative -the patient barely having one bowel movement with lactulose 4 times daily--> Increase lactulose to 45 g 4 times daily -Repeat lactulose enema -2 view abdominal x-ray to rule out ileus Restlessness / Fall risk. Likely a result of TBI and encephalopathy. Required sitter at bedside or restraints.  -Restraints/Sitter discontinued 11/9 as patient is calm. -patient continues to have episodes of impulsivity Hep C cirrohsis Encephalopathic. Otherwise, Essentially stable regarding LFTs, Coagulopathy, no evident ascites.  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  Hx of Alcoholism No longer drinking. Has been living in SNF.  Liver Mass 3 cm mass seen on CT 04/11/2014.   MRI ordered by Dr. [name] to obtain secondary to the patient's uncooperativeness -Alpha-fetoprotein 279.8 -appreciate GI follow-up   Family Communication:   Pt at beside Disposition Plan:   SNF     Procedures/Studies: Dg Chest 1 View  07/11/2014   CLINICAL DATA:  Acute encephalopathy  EXAM: CHEST - 1 VIEW  COMPARISON:  07/04/2014  FINDINGS: Cardiomediastinal silhouette is stable. No acute infiltrate or pleural effusion. Multiple old left rib fractures. Postsurgical changes with metallic fixation plate and screws in left clavicle.  IMPRESSION: No active disease. Old left rib fractures. Postsurgical changes left clavicle.   Electronically Signed   By: Lahoma Crocker M.D.   On: 07/11/2014 08:32   Ct Head Wo Contrast  07/05/2014   CLINICAL DATA:  Altered mental status. Disorientation. Metabolic encephalopathy. History of previous traumatic brain injury in August of this year.  EXAM: CT HEAD WITHOUT CONTRAST  TECHNIQUE: Contiguous axial images were obtained from the base of the skull through the vertex without intravenous contrast.  COMPARISON:  07/04/2014 and previous  FINDINGS: The brain shows mild generalized atrophy. There is no evidence of acute infarction, mass lesion, hemorrhage, hydrocephalus or extra-axial collection. Visualized sinuses are clear. No calvarial abnormality. Sebaceous cyst in the right frontal scalp.  IMPRESSION: No acute finding.  Mild generalized atrophy.   Electronically Signed   By: Nelson Chimes M.D.   On: 07/05/2014 20:10   US Abdomen Limited  07/10/2014   CLINICAL DATA:  Encephalopathy.  Evaluate for ascites.  EXAM: LIMITED ABDOMEN ULTRASOUND FOR ASCITES  TECHNIQUE: Limited ultrasound survey for ascites was performed in all four abdominal quadrants.  COMPARISON:  None.  FINDINGS: No ascites identified.  IMPRESSION: Negative exam for ascites   Electronically Signed   By: Queen Slough.D.  On: 07/10/2014 15:49         Subjective:   Objective: Filed  Vitals:   07/13/14 2320 07/14/14 0530 07/14/14 1118 07/14/14 1324  BP: 130/78 150/91 128/86 138/87  Pulse:  86 92 74  Temp:  98.4 F (36.9 C) 97.4 F (36.3 C) 97.5 F (36.4 C)  TempSrc:  Axillary Oral Oral  Resp:  16 20 18   Height:      Weight:      SpO2:  96% 100% 100%    Intake/Output Summary (Last 24 hours) at 07/14/14 1531 Last data filed at 07/14/14 1504  Gross per 24 hour  Intake   1200 ml  Output      0 ml  Net   1200 ml   Weight change:  Exam:   General:  Pt is alert, follows commands appropriately, not in acute distress  HEENT: No icterus, No thrush,  [place]/AT  Cardiovascular: RRR, S1/S2,   Respiratory: CTA bilaterally, no wheezing, no crackles, no rhonchi  Abdomen: Soft/+BS, non tender, non distended, no guarding  Extremities: trace LE edema, No lymphangitis, No petechiae, No rashes, no synovitis  Data Reviewed: Basic Metabolic Panel:  Recent Labs Lab 07/08/14 1107 07/12/14 0442 07/13/14 0625 07/14/14 0540  NA 137 138 138 137  K 4.1 4.8 4.3 4.6  CL 104 106 107 104  CO2 21 20 22 24   GLUCOSE 111* 82 77 95  BUN 10 11 10 8   CREATININE 0.46* 0.52 0.59 0.61  CALCIUM 8.9 9.1 9.1 9.0   Liver Function Tests:  Recent Labs Lab 07/08/14 1107 07/12/14 0442 07/14/14 0540  AST 60* 70* 75*  ALT 35 41 39  ALKPHOS 90 93 82  BILITOT 0.7 0.6 0.4  PROT 7.1 6.9 6.5  ALBUMIN 2.6* 2.6* 2.3*   No results for input(s): LIPASE, AMYLASE in the last 168 hours.  Recent Labs Lab 07/08/14 1107 07/10/14 0816 07/11/14 1851 07/13/14 0625 07/14/14 0540  AMMONIA 160* 153* 255* 152* 194*   CBC:  Recent Labs Lab 07/08/14 1107 07/14/14 0540  WBC 3.1* 3.4*  HGB 13.4 12.0*  HCT 39.2 35.7*  MCV 92.2 94.7  PLT 110* 117*   Cardiac Enzymes: No results for input(s): CKTOTAL, CKMB, CKMBINDEX, TROPONINI in the last 168 hours. BNP: Invalid input(s): POCBNP CBG: No results for input(s): GLUCAP in the last 168 hours.  Recent Results (from the past 240 hour(s))    Culture, Urine     Status: None   Collection Time: 07/05/14  7:20 PM  Result Value Ref Range Status   Specimen Description URINE, RANDOM  Final   Special Requests ADDED [id number]  Final   Culture  Setup Time   Final    07/07/2014 03:40 Performed at [place] Performed at [facility]   Final   Culture NO GROWTH Performed at [facility]   Final   Report Status 07/09/2014 FINAL  Final  MRSA PCR Screening     Status: None   Collection Time: 07/05/14 11:52 PM  Result Value Ref Range Status   MRSA by PCR NEGATIVE NEGATIVE Final    Comment:        The GeneXpert MRSA Assay (FDA approved for NASAL specimens only), is one component of a comprehensive MRSA colonization surveillance program. It is not intended to diagnose MRSA infection nor to guide or monitor treatment for MRSA infections.   Urine culture     Status: None   Collection Time: 07/13/14  6:42 AM  Result Value Ref Range Status   Specimen Description URINE, RANDOM  Final   Special Requests NONE  Final   Culture  Setup Time   Final    07/13/2014 13:00 Performed at [place] Performed at [facility]   Final   Culture NO GROWTH Performed at [facility]   Final   Report Status 07/14/2014 FINAL  Final     Scheduled Meds: . carbamazepine  200 mg Oral BID  . enoxaparin (LOVENOX) injection  40 mg Subcutaneous Q24H  . folic acid  1 mg Oral Daily  . lactulose  45 g Oral TID WC & HS  . lactulose  300 mL Rectal Once  . multivitamin with minerals  1 tablet Oral Daily  . pantoprazole  40 mg Oral Daily  . polyethylene glycol  17 g Oral Daily  . QUEtiapine  25 mg Oral QHS  . rifaximin  550 mg Oral BID  . thiamine  500 mg Oral TID   Continuous Infusions:    Markiah Janeway, DO  [facility] Pager [phone]  If 7PM-7AM, please contact night-coverage [email or web address] Password [email or web address] 07/14/2014, 3:31 PM    LOS: 9 days

## 2014-07-15 DIAGNOSIS — R188 Other ascites: Secondary | ICD-10-CM | POA: Insufficient documentation

## 2014-07-15 LAB — BASIC METABOLIC PANEL
Anion gap: 12 (ref 5–15)
BUN: 7 mg/dL (ref 6–23)
CO2: 23 mEq/L (ref 19–32)
Calcium: 9.9 mg/dL (ref 8.4–10.5)
Chloride: 103 mEq/L (ref 96–112)
Creatinine, Ser: 0.66 mg/dL (ref 0.50–1.35)
GFR calc non Af Amer: >90 mL/min (ref >90)
Glucose, Bld: 94 mg/dL (ref 70–99)
POTASSIUM: 4.7 meq/L (ref 3.7–5.3)
SODIUM: 138 meq/L (ref 137–147)

## 2014-07-15 LAB — AMMONIA: Ammonia: 91 umol/L — ABNORMAL HIGH (ref 11–60)

## 2014-07-15 MED ORDER — HALOPERIDOL LACTATE 5 MG/ML IJ SOLN
2.0000 mg | Freq: Once | INTRAMUSCULAR | Status: AC
  Administered 2014-07-16: 2 mg via INTRAMUSCULAR
  Filled 2014-07-15: qty 1

## 2014-07-15 NOTE — Clinical Social Work Note (Signed)
CSW continues to follow for d/c planning needs. CSW spoke with MD (Tat) who reports patient not ready for d/c on this date. CSW contacted facility, [facility] aware. CSW to follow tomorrow.  [place], [place] Weekend Clinical Social Worker [phone]

## 2014-07-15 NOTE — Plan of Care (Signed)
Problem: Phase II Progression Outcomes Goal: Progress activity as tolerated unless otherwise ordered Outcome: Completed/Met Date Met:  07/15/14  Problem: Discharge Progression Outcomes Goal: Discharge plan in place and appropriate Outcome: Progressing

## 2014-07-15 NOTE — Progress Notes (Signed)
PROGRESS NOTE  Jose Krueger JJK:[id number] DOB: [birth year] DOA: 07/05/2014 PCP: No primary care provider on file.  Assessment/Plan:  Acute Encephalopathy/Hepatic Encephalopathy -multifactorial including TBI, hepatic encephalopathy, medication and Wernicke/Korsakoff encephalopathy -high-dose thiamine started 07/11/2014 but pt did not receive due to lost IV access -will give po thiamine 500mg  tid x 3 days -check thiamine level--pending -serum B12 876, Tegretol level 7.1, TSH 1.530 -Check HIV antibody--negative -mental status not much improved -11/5--CT head negative.  -repeat U/A not convincing for UTI. Urine culture shows no growth. No evidence of other infectious sources. -07/11/2014 chest x-ray negative for infiltrates   -Consulted [facility] (07/10/2014). -abdominal ultrasound negative for ascites -MRI abdomen ordered by Dr. [name] to perform as pt not able to follow commands Per notes from CIR patient had intermittent periods of lethargy and agitation. He was placed on Tegretol and Seroquel.  Psychiatry was consulted and evaluated patient on 11/6, his Tegretol and Seroquel have been restarted. Per family patient can usually carry on a conversation but slow when he is at his baseline. -07/11/2014 chest x-ray negative -the patient barely having one bowel movement with lactulose 4 times daily--> Increase lactulose to 45 g 4 times daily -Repeat lactulose enemas -2 view abdominal x-ray --A/F levels suggest possible ileus -07/15/2014--fiance at the bedside today states that today is as good as she has seen him, but he is not yet at baseline Goals of Care -spoke at length with the patient's brother as well as his fiance -They are not ready to make the patient for comfort care -They are leaning toward making the patient DO NOT RESUSCITATE, but need the next 24 hours to process the information -Will try to arrange CT of the abdomen with triple phase liver  protocol on 07/16/2014 if he is more alert Restlessness / Fall risk. Likely a result of TBI and encephalopathy. Required sitter at bedside or restraints.  -Restraints/Sitter discontinued 11/9 as patient is calm. -patient continues to have episodes of impulsivity Hep C cirrohsis Encephalopathic. Otherwise, Essentially stable regarding LFTs, Coagulopathy, no evident ascites.  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  Hx of Alcoholism No longer drinking. Has been living in SNF.  Liver Mass 3 cm mass seen on CT 04/11/2014.  MRI ordered by Dr. [name] to obtain secondary to the patient's uncooperativeness -Alpha-fetoprotein 279.8 -appreciate GI follow-up   Family Communication: fianc updatedat beside--total time 45 minutes Disposition Plan: SNF        Procedures/Studies: Dg Chest 1 View  07/11/2014   CLINICAL DATA:  Acute encephalopathy  EXAM: CHEST - 1 VIEW  COMPARISON:  07/04/2014  FINDINGS: Cardiomediastinal silhouette is stable. No acute infiltrate or pleural effusion. Multiple old left rib fractures. Postsurgical changes with metallic fixation plate and screws in left clavicle.  IMPRESSION: No active disease. Old left rib fractures. Postsurgical changes left clavicle.   Electronically Signed   By: Lahoma Crocker M.D.   On: 07/11/2014 08:32   Ct Head Wo Contrast  07/05/2014   CLINICAL DATA:  Altered mental status. Disorientation. Metabolic encephalopathy. History of previous traumatic brain injury in August of this year.  EXAM: CT HEAD WITHOUT CONTRAST  TECHNIQUE: Contiguous axial images were obtained from the base of the skull through the vertex without intravenous contrast.  COMPARISON:  07/04/2014 and previous  FINDINGS: The brain shows mild generalized atrophy. There is no evidence of acute infarction, mass lesion, hemorrhage, hydrocephalus or extra-axial collection. Visualized sinuses are clear. No calvarial abnormality. Sebaceous cyst  in the right frontal scalp.   IMPRESSION: No acute finding.  Mild generalized atrophy.   Electronically Signed   By: Nelson Chimes M.D.   On: 07/05/2014 20:10   US Abdomen Limited  07/10/2014   CLINICAL DATA:  Encephalopathy.  Evaluate for ascites.  EXAM: LIMITED ABDOMEN ULTRASOUND FOR ASCITES  TECHNIQUE: Limited ultrasound survey for ascites was performed in all four abdominal quadrants.  COMPARISON:  None.  FINDINGS: No ascites identified.  IMPRESSION: Negative exam for ascites   Electronically Signed   By: Kerby Moors M.D.   On: 07/10/2014 15:49   Dg Abd 2 Views  07/15/2014   CLINICAL DATA:  Ileus  EXAM: ABDOMEN - 2 VIEW  COMPARISON:  04/21/2014  FINDINGS: There is no bowel dilatation to suggest obstruction. There is air seen within the small bowel and colon. There are small bowel and colonic air-fluid levels. There is no evidence of pneumoperitoneum, portal venous gas, or pneumatosis. There are no pathologic calcifications along the expected course of the ureters.  The osseous structures are unremarkable.  IMPRESSION: Small bowel and colonic air fluid levels without bowel dilatation. This appearance can be seen with an ileus versus enterocolitis.   Electronically Signed   By: Kathreen Devoid   On: 07/15/2014 02:05         Subjective: Patient is more alert and less agitated today. Denies any fevers, chills, chest pain, shortness breath, abdominal pain.  Objective: Filed Vitals:   07/14/14 1118 07/14/14 1324 07/14/14 2128 07/15/14 0624  BP: 128/86 138/87 157/124 102/57  Pulse: 92 74 89   Temp: 97.4 F (36.3 C) 97.5 F (36.4 C) 99.4 F (37.4 C) 98.3 F (36.8 C)  TempSrc: Oral Oral Oral Oral  Resp: 20 18 20 16   Height:      Weight:      SpO2: 100% 100% 100% 100%    Intake/Output Summary (Last 24 hours) at 07/15/14 1239 Last data filed at 07/15/14 0609  Gross per 24 hour  Intake    956 ml  Output    600 ml  Net    356 ml   Weight change:  Exam:   General:  Pt is alert, follows commands appropriately,  not in acute distress  HEENT: No icterus, No thrush, [place]/AT  Cardiovascular: RRR, S1/S2, no rubs, no gallops  Respiratory: CTA bilaterally, no wheezing, no crackles, no rhonchi  Abdomen: Soft/+BS, non tender, non distended, no guarding  Extremities: No edema, No lymphangitis, No petechiae, No rashes, no synovitis  Data Reviewed: Basic Metabolic Panel:  Recent Labs Lab 07/12/14 0442 07/13/14 0625 07/14/14 0540 07/15/14 0940  NA 138 138 137 138  K 4.8 4.3 4.6 4.7  CL 106 107 104 103  CO2 20 22 24 23   GLUCOSE 82 77 95 94  BUN 11 10 8 7   CREATININE 0.52 0.59 0.61 0.66  CALCIUM 9.1 9.1 9.0 9.9   Liver Function Tests:  Recent Labs Lab 07/12/14 0442 07/14/14 0540  AST 70* 75*  ALT 41 39  ALKPHOS 93 82  BILITOT 0.6 0.4  PROT 6.9 6.5  ALBUMIN 2.6* 2.3*   No results for input(s): LIPASE, AMYLASE in the last 168 hours.  Recent Labs Lab 07/10/14 0816 07/11/14 1851 07/13/14 0625 07/14/14 0540 07/15/14 0940  AMMONIA 153* 255* 152* 194* 91*   CBC:  Recent Labs Lab 07/14/14 0540  WBC 3.4*  HGB 12.0*  HCT 35.7*  MCV 94.7  PLT 117*   Cardiac Enzymes: No results for input(s): CKTOTAL, CKMB,  CKMBINDEX, TROPONINI in the last 168 hours. BNP: Invalid input(s): POCBNP CBG: No results for input(s): GLUCAP in the last 168 hours.  Recent Results (from the past 240 hour(s))  Culture, Urine     Status: None   Collection Time: 07/05/14  7:20 PM  Result Value Ref Range Status   Specimen Description URINE, RANDOM  Final   Special Requests ADDED [id number]  Final   Culture  Setup Time   Final    07/07/2014 03:40 Performed at [facility] Performed at [facility]   Final   Culture NO GROWTH Performed at [facility]   Final   Report Status 07/09/2014 FINAL  Final  MRSA PCR Screening     Status: None   Collection Time: 07/05/14 11:52 PM  Result Value Ref Range Status   MRSA by PCR NEGATIVE NEGATIVE Final     Comment:        The GeneXpert MRSA Assay (FDA approved for NASAL specimens only), is one component of a comprehensive MRSA colonization surveillance program. It is not intended to diagnose MRSA infection nor to guide or monitor treatment for MRSA infections.   Urine culture     Status: None   Collection Time: 07/13/14  6:42 AM  Result Value Ref Range Status   Specimen Description URINE, RANDOM  Final   Special Requests NONE  Final   Culture  Setup Time   Final    07/13/2014 13:00 Performed at [facility] Performed at [facility]   Final   Culture NO GROWTH Performed at [facility]   Final   Report Status 07/14/2014 FINAL  Final     Scheduled Meds: . carbamazepine  200 mg Oral BID  . enoxaparin (LOVENOX) injection  40 mg Subcutaneous Q24H  . folic acid  1 mg Oral Daily  . lactulose  45 g Oral TID WC & HS  . multivitamin with minerals  1 tablet Oral Daily  . pantoprazole  40 mg Oral Daily  . polyethylene glycol  17 g Oral Daily  . QUEtiapine  25 mg Oral QHS  . rifaximin  550 mg Oral BID  . thiamine  500 mg Oral TID   Continuous Infusions:    Kimi Bordeau, DO  [facility] Pager [phone]  If 7PM-7AM, please contact night-coverage [email or web address] Password [email or web address] 07/15/2014, 12:39 PM   LOS: 10 days

## 2014-07-16 LAB — BASIC METABOLIC PANEL
ANION GAP: 13 (ref 5–15)
BUN: 6 mg/dL (ref 6–23)
CALCIUM: 9.4 mg/dL (ref 8.4–10.5)
CHLORIDE: 106 meq/L (ref 96–112)
CO2: 20 meq/L (ref 19–32)
CREATININE: 0.55 mg/dL (ref 0.50–1.35)
GFR calc non Af Amer: >90 mL/min (ref >90)
Glucose, Bld: 87 mg/dL (ref 70–99)
Potassium: 4.7 mEq/L (ref 3.7–5.3)
Sodium: 139 mEq/L (ref 137–147)

## 2014-07-16 LAB — CARBAMAZEPINE LEVEL, TOTAL: Carbamazepine Lvl: 5.4 ug/mL (ref 4.0–12.0)

## 2014-07-16 LAB — AMMONIA: AMMONIA: 73 umol/L — AB (ref 11–60)

## 2014-07-16 MED ORDER — LORAZEPAM 2 MG/ML IJ SOLN
1.0000 mg | Freq: Once | INTRAMUSCULAR | Status: AC
  Administered 2014-07-17: 1 mg via INTRAVENOUS
  Filled 2014-07-16 (×2): qty 1

## 2014-07-16 NOTE — Progress Notes (Signed)
CT liver study cancelled d/t lack of proper IV access. Dr. Carles Collet informed and plan is to proceed with possible PICC placement tomorrow.

## 2014-07-16 NOTE — Plan of Care (Signed)
Problem: Phase III Progression Outcomes Goal: Activity at appropriate level-compared to baseline (UP IN CHAIR FOR HEMODIALYSIS)  Outcome: Completed/Met Date Met:  07/16/14 One assist to chair/bathroom

## 2014-07-16 NOTE — Progress Notes (Signed)
PROGRESS NOTE  Jose Krueger ESP:[id number] DOB: 15-Jun-1956 DOA: 07/05/2014 PCP: No primary care provider on file.  Assessment/Plan:  Events in the last 24 hours Patient appears more lethargic this am.  Attempted to regain IV access in order to move forward with CT Abdomen.  Thus far unsuccessful.  Patient may need PICC.  [name] is concerned that he may move too much to safely receive a PICC.  She would rather avoid PICC if he is not being very still.  Will discuss with attending whether or not the patient may benefit from a slight decrease in his haldol, tegretol or seroquel.   Acute Encephalopathy/Hepatic Encephalopathy -multifactorial including TBI, hepatic encephalopathy, medication and Wernicke/Korsakoff encephalopathy -high-dose thiamine started 07/11/2014 but pt did not receive due to lost IV access -will give po thiamine 500mg  tid x 3 days -check thiamine level--pending -serum B12 876, Tegretol level 7.1, TSH 1.530 -Check HIV antibody--negative -mental status not much improved -11/5--CT head negative.  -repeat U/A not convincing for UTI. Urine culture shows no growth. No evidence of other infectious sources. -07/11/2014 chest x-ray negative for infiltrates   -Consulted [name] (07/10/2014). -abdominal ultrasound negative for ascites -MRI abdomen ordered by Dr. [name] to perform as pt not able to follow commands Per notes from CIR patient had intermittent periods of lethargy and agitation. He was placed on Tegretol and Seroquel.  Psychiatry was consulted and evaluated patient on 11/6, his Tegretol and Seroquel have been restarted. Per family patient can usually carry on a conversation but slow when he is at his baseline. -07/11/2014 chest x-ray negative -the patient barely having one bowel movement with lactulose 4 times daily--> Increase lactulose to 45 g 4 times daily.  Goal is to have 3-5 bowel movements daily. -Repeat lactulose enemas -2  view abdominal x-ray --A/F levels suggest possible ileus -07/16/2014 [name] states he is normally more responsive.  Will check a tegretol level and discuss medications with the Attending.   Goals of Care -spoke at length with the patient's brother as well as his fiance (07/15/2014).   -They are not ready to make the patient for comfort care -They are leaning toward making the patient DO NOT RESUSCITATE, but need the next 24 hours to process the information -Spoke again with [name].  She still needs time to think about it.  We discussed providing full support up to Chest compressions and Intubation.  Restlessness / Fall risk. Likely a result of TBI and encephalopathy. Required sitter at bedside or restraints.  -Restraints/Sitter discontinued 11/9 as patient is calm. -patient continues to have episodes of impulsivity  Hep C cirrohsis Encephalopathic. Otherwise, Essentially stable regarding LFTs, Coagulopathy, no evident ascites.  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  Hx of Alcoholism No longer drinking. Has been living in SNF.  Liver Mass 3 cm mass seen on CT 04/11/2014.  MRI ordered by Dr. [name] to obtain secondary to the patient's uncooperativeness CT Abdomen with triple phase liver protocol ordered 07/16/2014.  Trying to obtain IV access for test. Alpha-fetoprotein 279.8 elevated. GI has essentially signed off.   Family Communication:Spoke with [name] at bedside. Disposition Plan: SNF        Procedures/Studies: Dg Chest 1 View  07/11/2014   CLINICAL DATA:  Acute encephalopathy  EXAM: CHEST - 1 VIEW  COMPARISON:  07/04/2014  FINDINGS: Cardiomediastinal silhouette is stable. No acute infiltrate or pleural effusion. Multiple old left rib fractures. Postsurgical changes with metallic fixation plate and  screws in left clavicle.  IMPRESSION: No active disease. Old left rib fractures. Postsurgical changes left clavicle.   Electronically Signed    By: Lahoma Crocker M.D.   On: 07/11/2014 08:32   Ct Head Wo Contrast  07/05/2014   CLINICAL DATA:  Altered mental status. .  IMPRESSION: No acute finding.  Mild generalized atrophy.   Electronically Signed   By: Nelson Chimes M.D.   On: 07/05/2014 20:10   US Abdomen Limited  07/10/2014   CLINICAL DATA:  Encephalopathy.  Evaluate for ascites.   FINDINGS: No ascites identified.  IMPRESSION: Negative exam for ascites   Electronically Signed   By: Kerby Moors M.D.   On: 07/10/2014 15:49   Dg Abd 2 Views  07/15/2014    IMPRESSION: Small bowel and colonic air fluid levels without bowel dilatation. This appearance can be seen with an ileus versus enterocolitis.   Electronically Signed   By: Kathreen Devoid   On: 07/15/2014 02:05        Subjective: Patient is unable to discuss any complaints.  Very sleepy.  Objective: Filed Vitals:   07/14/14 2128 07/15/14 0624 07/15/14 1256 07/16/14 1407  BP: 157/124 102/57 138/79 156/77  Pulse: 89  81   Temp: 99.4 F (37.4 C) 98.3 F (36.8 C) 98.6 F (37 C) 98 F (36.7 C)  TempSrc: Oral Oral Oral Oral  Resp: 20 16 18    Height:      Weight:      SpO2: 100% 100% 98%     Intake/Output Summary (Last 24 hours) at 07/16/14 1655 Last data filed at 07/16/14 0944  Gross per 24 hour  Intake    240 ml  Output    300 ml  Net    -60 ml   Weight change:  Exam:   General:  Patient is lethargic.  Slow to respond.  Speech very difficult to understand.  HEENT: No icterus, No thrush, [place]/AT  Cardiovascular: RRR, S1/S2, no rubs, no gallops  Respiratory: CTA bilaterally, no wheezing, no crackles, no rhonchi  Abdomen: Soft/+BS, non tender, non distended, no guarding  Extremities: No edema, No lymphangitis, No petechiae, No rashes, no synovitis  Psych:  Patient is calm, slow to respond, but he gives me his location on questioning.  Data Reviewed: Basic Metabolic Panel:  Recent Labs Lab 07/12/14 0442 07/13/14 0625 07/14/14 0540 07/15/14 0940  07/16/14 0645  NA 138 138 137 138 139  K 4.8 4.3 4.6 4.7 4.7  CL 106 107 104 103 106  CO2 20 22 24 23 20   GLUCOSE 82 77 95 94 87  BUN 11 10 8 7 6   CREATININE 0.52 0.59 0.61 0.66 0.55  CALCIUM 9.1 9.1 9.0 9.9 9.4   Liver Function Tests:  Recent Labs Lab 07/12/14 0442 07/14/14 0540  AST 70* 75*  ALT 41 39  ALKPHOS 93 82  BILITOT 0.6 0.4  PROT 6.9 6.5  ALBUMIN 2.6* 2.3*    Recent Labs Lab 07/11/14 1851 07/13/14 0625 07/14/14 0540 07/15/14 0940 07/16/14 0635  AMMONIA 255* 152* 194* 91* 73*   CBC:  Recent Labs Lab 07/14/14 0540  WBC 3.4*  HGB 12.0*  HCT 35.7*  MCV 94.7  PLT 117*    Recent Results (from the past 240 hour(s))  Urine culture     Status: None   Collection Time: 07/13/14  6:42 AM  Result Value Ref Range Status   Specimen Description URINE, RANDOM  Final   Special Requests NONE  Final  Culture  Setup Time   Final    07/13/2014 13:00 Performed at [facility] Performed at [facility]   Final   Culture NO GROWTH Performed at [facility]   Final   Report Status 07/14/2014 FINAL  Final     Scheduled Meds: . carbamazepine  200 mg Oral BID  . enoxaparin (LOVENOX) injection  40 mg Subcutaneous Q24H  . folic acid  1 mg Oral Daily  . lactulose  45 g Oral TID WC & HS  . LORazepam  1 mg Intravenous Once  . multivitamin with minerals  1 tablet Oral Daily  . pantoprazole  40 mg Oral Daily  . polyethylene glycol  17 g Oral Daily  . QUEtiapine  25 mg Oral QHS  . rifaximin  550 mg Oral BID  . thiamine  500 mg Oral TID   Continuous Infusions:    Ruthine Dose [facility] Pager [phone]  If 7PM-7AM, please contact night-coverage [email or web address] Password [email or web address] 07/16/2014, 4:55 PM   LOS: 11 days

## 2014-07-17 LAB — AMMONIA: Ammonia: 102 umol/L — ABNORMAL HIGH (ref 11–60)

## 2014-07-17 MED ORDER — HALOPERIDOL LACTATE 2 MG/ML PO CONC
2.0000 mg | Freq: Three times a day (TID) | ORAL | Status: DC | PRN
  Filled 2014-07-17: qty 1

## 2014-07-17 MED ORDER — LORAZEPAM 2 MG/ML IJ SOLN
1.0000 mg | Freq: Four times a day (QID) | INTRAMUSCULAR | Status: DC | PRN
  Administered 2014-07-17 – 2014-07-18 (×3): 1 mg via INTRAVENOUS
  Filled 2014-07-17 (×3): qty 1
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
## 2014-07-18 ENCOUNTER — Encounter (HOSPITAL_COMMUNITY): Payer: Self-pay

## 2014-07-18 ENCOUNTER — Ambulatory Visit (HOSPITAL_COMMUNITY): Payer: Medicaid Other

## 2014-07-18 ENCOUNTER — Other Ambulatory Visit (HOSPITAL_COMMUNITY): Payer: Self-pay

## 2014-07-18 LAB — BASIC METABOLIC PANEL
ANION GAP: 8 (ref 5–15)
BUN: 10 mg/dL (ref 6–23)
CALCIUM: 9.4 mg/dL (ref 8.4–10.5)
CO2: 23 mEq/L (ref 19–32)
Chloride: 106 mEq/L (ref 96–112)
Creatinine, Ser: 0.5 mg/dL (ref 0.50–1.35)
GFR calc Af Amer: >90 mL/min (ref >90)
GLUCOSE: 78 mg/dL (ref 70–99)
POTASSIUM: 4.7 meq/L (ref 3.7–5.3)
SODIUM: 137 meq/L (ref 137–147)

## 2014-07-18 MED ORDER — IOHEXOL 300 MG/ML  SOLN
100.0000 mL | Freq: Once | INTRAMUSCULAR | Status: AC | PRN
  Administered 2014-07-18: 100 mL via INTRAVENOUS

## 2014-07-18 MED ORDER — LORAZEPAM 1 MG PO TABS: 1.0000 mg | ORAL_TABLET | Freq: Four times a day (QID) | ORAL | Status: DC | PRN

## 2014-07-18 MED ORDER — CARBAMAZEPINE 200 MG PO TABS: 200.0000 mg | ORAL_TABLET | Freq: Two times a day (BID) | ORAL | Status: AC

## 2014-07-18 MED ORDER — RIFAXIMIN 550 MG PO TABS: 550.0000 mg | ORAL_TABLET | Freq: Two times a day (BID) | ORAL | Status: AC

## 2014-07-18 MED ORDER — LACTULOSE ENEMA: 300.0000 mL | Freq: Two times a day (BID) | ORAL | Status: DC | PRN

## 2014-07-18 MED ORDER — QUETIAPINE FUMARATE 25 MG PO TABS: ORAL_TABLET | ORAL | Status: DC

## 2014-07-18 NOTE — Clinical Social Work Note (Signed)
Per MD patient ready to DC back to [facility], patient/family [name] notified by phone), and facility notified of patient's DC. RN given number for report. DC packet on patient's chart. Ambulance transport requested for patient. CSW signing off at this time.   Liz Beach MSW, [place], [place], [phone]
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
## 2014-07-19 ENCOUNTER — Telehealth: Payer: Self-pay | Admitting: Hematology

## 2014-07-19 LAB — VITAMIN B1: VITAMIN B1 (THIAMINE): 188 nmol/L — AB (ref 8–30)

## 2014-07-19 NOTE — Telephone Encounter (Signed)
S/W FELICIA @ [facility] AND GAVE NP APPT FOR 11/24 @ 10:30 W/DR. [facility].  REFERRING DR. Candiss Norse DX- HEPATOCELLULAR
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
## 2014-07-24 ENCOUNTER — Encounter: Payer: Self-pay | Admitting: Hematology

## 2014-07-24 ENCOUNTER — Ambulatory Visit (HOSPITAL_BASED_OUTPATIENT_CLINIC_OR_DEPARTMENT_OTHER): Payer: Medicaid Other | Admitting: Hematology

## 2014-07-24 ENCOUNTER — Telehealth: Payer: Self-pay | Admitting: Hematology

## 2014-07-24 ENCOUNTER — Ambulatory Visit: Payer: Medicaid Other

## 2014-07-24 VITALS — BP 118/75 | HR 81 | Resp 18 | Ht 72.0 in

## 2014-07-24 DIAGNOSIS — C220 Liver cell carcinoma: Secondary | ICD-10-CM

## 2014-07-24 NOTE — Progress Notes (Signed)
Checked in new patient with no one family with him. He is from facility. He could not sign anything.

## 2014-07-24 NOTE — Progress Notes (Signed)
Error

## 2014-07-24 NOTE — Telephone Encounter (Signed)
Gave avs & appt w/ Dr.Jacobs

## 2014-07-25 ENCOUNTER — Ambulatory Visit: Payer: Self-pay

## 2014-07-25 ENCOUNTER — Ambulatory Visit: Payer: Self-pay | Admitting: Hematology

## 2014-07-26 ENCOUNTER — Encounter: Payer: Self-pay | Admitting: Hematology

## 2014-08-13 ENCOUNTER — Telehealth: Payer: Self-pay | Admitting: Hematology

## 2014-08-13 NOTE — Telephone Encounter (Signed)
Got updated addresses and changed in the system

## 2014-08-16 ENCOUNTER — Telehealth: Payer: Self-pay | Admitting: *Deleted

## 2014-08-16 NOTE — Telephone Encounter (Signed)
Received call from Elliot Gault @ [facility] and [facility] requesting follow up appt for pt.  Spoke with Solmon Ice, and informed her re:  Per Dr. Burr Medico at pt's last office visit, pt is scheduled to follow up with Dr. Ardis Hughs.  MD will decide on f/u with this clinic as needed, and she will be contacted with pt's appts.  Felicia voiced understanding. Felicia's  Phone    [phone].
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
## 2014-09-25 ENCOUNTER — Encounter: Payer: Medicaid Other | Attending: Physical Medicine & Rehabilitation | Admitting: Physical Medicine & Rehabilitation

## 2014-09-25 DIAGNOSIS — K7460 Unspecified cirrhosis of liver: Secondary | ICD-10-CM | POA: Insufficient documentation

## 2014-09-25 DIAGNOSIS — R4189 Other symptoms and signs involving cognitive functions and awareness: Secondary | ICD-10-CM | POA: Insufficient documentation

## 2014-09-25 DIAGNOSIS — Z87820 Personal history of traumatic brain injury: Secondary | ICD-10-CM | POA: Insufficient documentation

## 2014-10-02 ENCOUNTER — Encounter: Payer: Self-pay | Admitting: Gastroenterology

## 2014-10-02 ENCOUNTER — Ambulatory Visit (INDEPENDENT_AMBULATORY_CARE_PROVIDER_SITE_OTHER): Payer: Medicaid Other | Admitting: Gastroenterology

## 2014-10-02 VITALS — BP 110/62 | HR 72

## 2014-10-02 DIAGNOSIS — K7030 Alcoholic cirrhosis of liver without ascites: Secondary | ICD-10-CM
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
## 2014-10-02 NOTE — Patient Instructions (Addendum)
For now recommend comfort measures, palliative care approach.  No further testing is recommended. Will try to find contact information for his DPOA to discuss the above.  She was not available at time of his visit today.

## 2014-12-10 ENCOUNTER — Ambulatory Visit: Payer: Self-pay | Admitting: Gastroenterology
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
## 2015-05-30 ENCOUNTER — Emergency Department (HOSPITAL_COMMUNITY): Payer: Medicaid Other

## 2015-05-30 ENCOUNTER — Emergency Department (HOSPITAL_COMMUNITY)
Admission: EM | Admit: 2015-05-30 | Discharge: 2015-05-30 | Disposition: A | Discharge status: Home / Self Care | Payer: Medicaid Other | Attending: Emergency Medicine | Admitting: Emergency Medicine

## 2015-05-30 ENCOUNTER — Encounter (HOSPITAL_COMMUNITY): Payer: Self-pay

## 2015-05-30 DIAGNOSIS — Z792 Long term (current) use of antibiotics: Secondary | ICD-10-CM | POA: Insufficient documentation

## 2015-05-30 DIAGNOSIS — Y9389 Activity, other specified: Secondary | ICD-10-CM | POA: Insufficient documentation

## 2015-05-30 DIAGNOSIS — S8992XA Unspecified injury of left lower leg, initial encounter: Secondary | ICD-10-CM | POA: Diagnosis not present

## 2015-05-30 DIAGNOSIS — Y9289 Other specified places as the place of occurrence of the external cause: Secondary | ICD-10-CM | POA: Insufficient documentation

## 2015-05-30 DIAGNOSIS — Z862 Personal history of diseases of the blood and blood-forming organs and certain disorders involving the immune mechanism: Secondary | ICD-10-CM | POA: Insufficient documentation

## 2015-05-30 DIAGNOSIS — W07XXXA Fall from chair, initial encounter: Secondary | ICD-10-CM | POA: Insufficient documentation

## 2015-05-30 DIAGNOSIS — Z720 Tobacco use: Secondary | ICD-10-CM | POA: Insufficient documentation

## 2015-05-30 DIAGNOSIS — Y998 Other external cause status: Secondary | ICD-10-CM | POA: Insufficient documentation

## 2015-05-30 DIAGNOSIS — Z79899 Other long term (current) drug therapy: Secondary | ICD-10-CM | POA: Diagnosis not present

## 2015-05-30 DIAGNOSIS — Z86718 Personal history of other venous thrombosis and embolism: Secondary | ICD-10-CM | POA: Insufficient documentation

## 2015-05-30 DIAGNOSIS — M25561 Pain in right knee: Secondary | ICD-10-CM

## 2015-05-30 DIAGNOSIS — W19XXXA Unspecified fall, initial encounter: Secondary | ICD-10-CM

## 2015-05-30 DIAGNOSIS — Z8669 Personal history of other diseases of the nervous system and sense organs: Secondary | ICD-10-CM | POA: Diagnosis not present

## 2015-05-30 DIAGNOSIS — Z8659 Personal history of other mental and behavioral disorders: Secondary | ICD-10-CM | POA: Insufficient documentation

## 2015-05-30 DIAGNOSIS — S8991XA Unspecified injury of right lower leg, initial encounter: Secondary | ICD-10-CM | POA: Diagnosis not present

## 2015-05-30 DIAGNOSIS — Z8619 Personal history of other infectious and parasitic diseases: Secondary | ICD-10-CM | POA: Diagnosis not present

## 2015-05-30 DIAGNOSIS — Z8719 Personal history of other diseases of the digestive system: Secondary | ICD-10-CM | POA: Insufficient documentation

## 2015-05-30 DIAGNOSIS — M25562 Pain in left knee: Secondary | ICD-10-CM

## 2015-05-30 DIAGNOSIS — R4182 Altered mental status, unspecified: Secondary | ICD-10-CM | POA: Insufficient documentation

## 2015-05-30 LAB — CBG MONITORING, ED: GLUCOSE-CAPILLARY: 89 mg/dL (ref 65–99)
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
## 2015-05-30 NOTE — ED Notes (Signed)
Unable to obtain  CT due to excessive movement and unable to cooperate.

## 2015-05-30 NOTE — ED Notes (Signed)
Safety sitter to bedside. Pt attempted to get out of bed x 3. He actually got up on the last attempt. Was able to bear weight and take a few steps. Bed rails up x 2 since arrival. MD aware.

## 2015-05-30 NOTE — ED Provider Notes (Signed)
CSN: [id number]     Arrival date & time 05/30/15  1214 History  By signing my name below, I, Jose Krueger, attest that this documentation has been prepared under the direction and in the presence of [name], MD. Electronically Signed: Erling Krueger, ED Scribe. 05/30/2015. 1:01 PM.    Chief Complaint  Patient presents with  . Altered Mental Status    LEVEL 5 CAVEAT--ALTERED MENTAL STATUS  The history is provided by the nursing home. The history is limited by the condition of the patient. No language interpreter was used.    HPI Comments: Jose Krueger is a 59 y.o. male h/o TBI brought in by ambulance, who presents to the Emergency Department due to two fall that occurred this morning. Pt is currently a resident at [facility] due to a TBI. Per nurse at nursing home, pt was sitting in his chair and fell to his left side. There was no LOC. Nurse states a few minutes later he stood up and fell a 2nd time and landed on his bilateral knee and head. Per nursing home pt is ambulatory and sometimes his speech is clear and other time it is not.     Past Medical History  Diagnosis Date  . Hepatitis C     viral load in 12/2010 was >1,000,000  . Cirrhosis 2006  . Alcohol abuse     chronic  . Portal vein thrombosis 2012  . Thrombocytopenia   . Pancytopenia   . Physiological tremor   . 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 Multiple trauma 03/2014    rib, clavicle,orbital fractures.  pneumothorax and possible hemothorax.   Past Surgical History  Procedure Laterality Date  . Orif clavicular fracture Left 04/27/2014    Procedure: OPEN REDUCTION INTERNAL FIXATION (ORIF) LEFT CLAVICULAR FRACTURE;  Surgeon: Johnny Bridge, MD;  Location: [place];  Service: Orthopedics;  Laterality: Left;  . Esophagogastroduodenoscopy  12/2010    dr Benson [place].  found small fundal varix.    Family History  Problem Relation Age of Onset  . Cancer  Mother     type unknown  . Alcohol abuse Father   . Alcohol abuse Brother    Social History  Substance Use Topics  . Smoking status: Current Every Day Smoker -- 0.50 packs/day    Types: Cigarettes  . Smokeless tobacco: None  . Alcohol Use: 5.4 oz/week    4 Cans of beer, 5 Shots of liquor per week     Comment: drinks liqour and beer daily    Review of Systems  Unable to perform ROS: Mental status change      Allergies  Review of patient's allergies indicates no known allergies.  Home Medications   Prior to Admission medications   Medication Sig Start Date End Date Taking? Authorizing Provider  acetaminophen (TYLENOL) 325 MG tablet Take 1-2 tablets (325-650 mg total) by mouth every 4 (four) hours as needed for mild pain. 05/22/14   Bary Leriche, PA-C  carbamazepine (TEGRETOL) 200 MG tablet Take 1 tablet (200 mg total) by mouth 2 (two) times daily. 07/18/14   Jaelin Rumpf York, PA-C  chlorhexidine (PERIDEX) 0.12 % solution 15 mLs by Mouth Rinse route 2 (two) times daily. 05/22/14   Bary Leriche, PA-C  folic acid (FOLVITE) 1 MG tablet Take 1 tablet (1 mg total) by mouth daily. 05/22/14   Ivan Anchors Love, PA-C  ipratropium-albuterol (DUONEB) 0.5-2.5 (3)  MG/3ML SOLN Take 3 mLs by nebulization every 6 (six) hours as needed. 05/22/14   Bary Leriche, PA-C  lactulose (CHRONULAC) 10 GM/15ML SOLN enema Place 300 mLs rectally 3 times/day as needed-between meals & bedtime (Hepatic encephalopathy treatment and prevention.). 07/18/14   Melton Alar, PA-C  lactulose (CHRONULAC) 10 GM/15ML solution Take 67.5 mLs (45 g total) by mouth 4 (four) times daily -  with meals and at bedtime. 05/22/14   Ivan Anchors Love, PA-C  LORazepam (ATIVAN) 1 MG tablet Take 1 tablet (1 mg total) by mouth every 6 (six) hours as needed for anxiety. 07/18/14   Melton Alar, PA-C  Multiple Vitamin (MULTIVITAMIN WITH MINERALS) TABS tablet Take 1 tablet by mouth daily. 05/22/14   Bary Leriche, PA-C  omeprazole (PRILOSEC) 20 MG  capsule Take 20 mg by mouth daily.    Historical Provider, MD  polyethylene glycol (MIRALAX / GLYCOLAX) packet Take 17 g by mouth daily. 05/22/14   Bary Leriche, PA-C  QUEtiapine (SEROQUEL) 25 MG tablet Take 1 tablet at bedtime for sleep 07/18/14   Melton Alar, PA-C  rifaximin (XIFAXAN) 550 MG TABS tablet Take 1 tablet (550 mg total) by mouth 2 (two) times daily. 07/18/14   Melton Alar, PA-C  thiamine 100 MG tablet Take 1 tablet (100 mg total) by mouth daily. 05/22/14   Bary Leriche, PA-C   Triage Vitals: BP 115/51 mmHg  Pulse 77  Temp(Src) 98.7 F (37.1 C) (Rectal)  Resp 12  SpO2 99%  Physical Exam  Constitutional: He appears well-developed and well-nourished. No distress.  HENT:  Head: Normocephalic and atraumatic.  No obvious head trauma  Eyes: Conjunctivae and EOM are normal.  Neck: Neck supple. No tracheal deviation present.  Cardiovascular: Normal rate, regular rhythm and normal heart sounds.   Good peripheral pulses  Pulmonary/Chest: Effort normal and breath sounds normal. No respiratory distress.  Abdominal: He exhibits no distension. There is no tenderness.  Musculoskeletal: Normal range of motion.  MAES  Neurological:  Not alert or oriented but awake.  Does not follow commands Minimally interactive  Skin: Skin is warm and dry.  Psychiatric: Cognition and memory are impaired.  Nursing note and vitals reviewed.   ED Course  Procedures (including critical care time)  DIAGNOSTIC STUDIES: Oxygen Saturation is 99% on RA, normal by my interpretation.    COORDINATION OF CARE:  1:01 PM - Will order CT head w/o contrast, and imaging of left and right knee. Will also order 12 lead EKG.   Sodium after the initial evaluation the patient presents to, was ambulatory, though similarly, inconsistently interactive.  The attempts to obtain CT imaging was unsuccessful, as the patient was moving, sitting up, trying to walk.   Imaging Review Dg Knee 1-2 Views  Left  05/30/2015   CLINICAL DATA:  Fall.  Knee pain  EXAM: LEFT KNEE - 1-2 VIEW  COMPARISON:  None.  FINDINGS: There is no evidence of fracture, dislocation, or joint effusion. There is no evidence of arthropathy or other focal bone abnormality. Soft tissues are unremarkable.  IMPRESSION: Negative.   Electronically Signed   By: Franchot Gallo M.D.   On: 05/30/2015 13:31   Dg Knee 1-2 Views Right  05/30/2015   CLINICAL DATA:  Pain following fall  EXAM: RIGHT KNEE - 1-2 VIEW  COMPARISON:  None.  FINDINGS: Frontal and lateral views obtained. No fracture or dislocation. No joint effusion. Joint spaces appear unremarkable. No erosive change.  IMPRESSION: No fracture or  effusion.  No appreciable arthropathy.   Electronically Signed   By: Lowella Grip III M.D.   On: 05/30/2015 13:33   I have personal thisly reviewed and evaluated these images and lab results as part of my medical decision-making.   EKG Interpretation   Date/Time:  Thursday May 30 2015 12:22:36 EDT Ventricular Rate:  78 PR Interval:  190 QRS Duration: 92 QT Interval:  402 QTC Calculation: 458 R Axis:   39 Text Interpretation:  Sinus rhythm Sinus rhythm No significant change  since last tracing Abnormal ekg Confirmed by [name]  MD [phone])  on 05/30/2015 12:40:57 PM         MDM   I personally performed the services described in this documentation, which was scribed in my presence. The recorded information has been reviewed and is accurate.  This patient with baseline neuro deficits / cognitive impairment presents after two falls.  Here the patient appears to be interacting in a typical manner, but with limited capacity to provide full HPI, CT was planned, in addition to XR of the knees. Patient was non-cooperative during CT attempt, and risk of sedation outweighs potential benefit of these results given his seemingly typical mental state. XR of the knees was normal, ECG also unremarkable. Patient d/c in  stable condition to his NH.   [name], MD 05/30/15 1349

## 2015-05-30 NOTE — ED Notes (Signed)
Spoke with Brunetta Jeans, pt's nurse. States pt was sitting in a regular chair and he and the chair fell to the left. States she placed him back in the chair. When she turned to get the machine he stood up and fell to his knees. States he did not hit his head. She also said the pt is usually ambulatory but will stop and lay down on the floor sometimes. Also, his speech is clear some days but other days it is garbled

## 2015-05-30 NOTE — Discharge Instructions (Signed)
As discussed, your evaluation today has been largely reassuring.  But, it is important that you monitor your condition carefully, and do not hesitate to return to the ED if you develop new, or concerning changes in your condition. ° °Otherwise, please follow-up with your physician for appropriate ongoing care. ° °Cryotherapy °Cryotherapy means treatment with cold. Ice or gel packs can be used to reduce both pain and swelling. Ice is the most helpful within the first 24 to 48 hours after an injury or flare-up from overusing a muscle or joint. Sprains, strains, spasms, burning pain, shooting pain, and aches can all be eased with ice. Ice can also be used when recovering from surgery. Ice is effective, has very few side effects, and is safe for most people to use. °PRECAUTIONS  °Ice is not a safe treatment option for people with: °· Raynaud phenomenon. This is a condition affecting small blood vessels in the extremities. Exposure to cold may cause your problems to return. °· Cold hypersensitivity. There are many forms of cold hypersensitivity, including: °¨ Cold urticaria. Red, itchy hives appear on the skin when the tissues begin to warm after being iced. °¨ Cold erythema. This is a red, itchy rash caused by exposure to cold. °¨ Cold hemoglobinuria. Red blood cells break down when the tissues begin to warm after being iced. The hemoglobin that carry oxygen are passed into the urine because they cannot combine with blood proteins fast enough. °· Numbness or altered sensitivity in the area being iced. °If you have any of the following conditions, do not use ice until you have discussed cryotherapy with your caregiver: °· Heart conditions, such as arrhythmia, angina, or chronic heart disease. °· High blood pressure. °· Healing wounds or open skin in the area being iced. °· Current infections. °· Rheumatoid arthritis. °· Poor circulation. °· Diabetes. °Ice slows the blood flow in the region it is applied. This is  beneficial when trying to stop inflamed tissues from spreading irritating chemicals to surrounding tissues. However, if you expose your skin to cold temperatures for too long or without the proper protection, you can damage your skin or nerves. Watch for signs of skin damage due to cold. °HOME CARE INSTRUCTIONS °Follow these tips to use ice and cold packs safely. °· Place a dry or damp towel between the ice and skin. A damp towel will cool the skin more quickly, so you may need to shorten the time that the ice is used. °· For a more rapid response, add gentle compression to the ice. °· Ice for no more than 10 to 20 minutes at a time. The bonier the area you are icing, the less time it will take to get the benefits of ice. °· Check your skin after 5 minutes to make sure there are no signs of a poor response to cold or skin damage. °· Rest 20 minutes or more between uses. °· Once your skin is numb, you can end your treatment. You can test numbness by very lightly touching your skin. The touch should be so light that you do not see the skin dimple from the pressure of your fingertip. When using ice, most people will feel these normal sensations in this order: cold, burning, aching, and numbness. °· Do not use ice on someone who cannot communicate their responses to pain, such as small children or people with dementia. °HOW TO MAKE AN ICE PACK °Ice packs are the most common way to use ice therapy. Other methods include ice   massage, ice baths, and cryosprays. Muscle creams that cause a cold, tingly feeling do not offer the same benefits that ice offers and should not be used as a substitute unless recommended by your caregiver. °To make an ice pack, do one of the following: °· Place crushed ice or a bag of frozen vegetables in a sealable plastic bag. Squeeze out the excess air. Place this bag inside another plastic bag. Slide the bag into a pillowcase or place a damp towel between your skin and the bag. °· Mix 3 parts  water with 1 part rubbing alcohol. Freeze the mixture in a sealable plastic bag. When you remove the mixture from the freezer, it will be slushy. Squeeze out the excess air. Place this bag inside another plastic bag. Slide the bag into a pillowcase or place a damp towel between your skin and the bag. °SEEK MEDICAL CARE IF: °· You develop white spots on your skin. This may give the skin a blotchy (mottled) appearance. °· Your skin turns blue or pale. °· Your skin becomes waxy or hard. °· Your swelling gets worse. °MAKE SURE YOU:  °· Understand these instructions. °· Will watch your condition. °· Will get help right away if you are not doing well or get worse. °Document Released: 04/13/2011 Document Revised: 01/01/2014 Document Reviewed: 04/13/2011 °Exit[employer]® Patient Information ©2015 [employer]. This information is not intended to replace advice given to you by your health care provider. Make sure you discuss any questions you have with your health care provider. ° °

## 2015-06-11 ENCOUNTER — Emergency Department (HOSPITAL_COMMUNITY): Payer: Medicaid Other

## 2015-06-11 ENCOUNTER — Emergency Department (HOSPITAL_COMMUNITY)
Admission: EM | Admit: 2015-06-11 | Discharge: 2015-06-11 | Disposition: A | Discharge status: Home / Self Care | Payer: Medicaid Other | Attending: Physician Assistant | Admitting: Physician Assistant

## 2015-06-11 ENCOUNTER — Encounter (HOSPITAL_COMMUNITY): Payer: Self-pay | Admitting: Emergency Medicine

## 2015-06-11 DIAGNOSIS — Y9389 Activity, other specified: Secondary | ICD-10-CM | POA: Insufficient documentation

## 2015-06-11 DIAGNOSIS — Z8669 Personal history of other diseases of the nervous system and sense organs: Secondary | ICD-10-CM | POA: Diagnosis not present

## 2015-06-11 DIAGNOSIS — Z720 Tobacco use: Secondary | ICD-10-CM | POA: Insufficient documentation

## 2015-06-11 DIAGNOSIS — Z792 Long term (current) use of antibiotics: Secondary | ICD-10-CM | POA: Insufficient documentation

## 2015-06-11 DIAGNOSIS — S0083XA Contusion of other part of head, initial encounter: Secondary | ICD-10-CM | POA: Diagnosis not present

## 2015-06-11 DIAGNOSIS — Z79899 Other long term (current) drug therapy: Secondary | ICD-10-CM | POA: Diagnosis not present

## 2015-06-11 DIAGNOSIS — X58XXXA Exposure to other specified factors, initial encounter: Secondary | ICD-10-CM | POA: Diagnosis not present

## 2015-06-11 DIAGNOSIS — M7989 Other specified soft tissue disorders: Secondary | ICD-10-CM | POA: Diagnosis not present

## 2015-06-11 DIAGNOSIS — Z87820 Personal history of traumatic brain injury: Secondary | ICD-10-CM | POA: Diagnosis not present

## 2015-06-11 DIAGNOSIS — R7989 Other specified abnormal findings of blood chemistry: Secondary | ICD-10-CM | POA: Diagnosis present

## 2015-06-11 DIAGNOSIS — Y998 Other external cause status: Secondary | ICD-10-CM | POA: Diagnosis not present

## 2015-06-11 DIAGNOSIS — Z86718 Personal history of other venous thrombosis and embolism: Secondary | ICD-10-CM | POA: Diagnosis not present

## 2015-06-11 DIAGNOSIS — Z8719 Personal history of other diseases of the digestive system: Secondary | ICD-10-CM | POA: Insufficient documentation

## 2015-06-11 DIAGNOSIS — Y9289 Other specified places as the place of occurrence of the external cause: Secondary | ICD-10-CM | POA: Diagnosis not present

## 2015-06-11 DIAGNOSIS — Z87828 Personal history of other (healed) physical injury and trauma: Secondary | ICD-10-CM | POA: Insufficient documentation

## 2015-06-11 DIAGNOSIS — E7220 Disorder of urea cycle metabolism, unspecified: Secondary | ICD-10-CM | POA: Insufficient documentation

## 2015-06-11 DIAGNOSIS — Z8781 Personal history of (healed) traumatic fracture: Secondary | ICD-10-CM | POA: Diagnosis not present

## 2015-06-11 DIAGNOSIS — Z8619 Personal history of other infectious and parasitic diseases: Secondary | ICD-10-CM | POA: Diagnosis not present

## 2015-06-11 DIAGNOSIS — Z862 Personal history of diseases of the blood and blood-forming organs and certain disorders involving the immune mechanism: Secondary | ICD-10-CM | POA: Diagnosis not present

## 2015-06-11 LAB — AMMONIA: AMMONIA: 104 umol/L — AB (ref 9–35)

## 2015-06-11 MED ORDER — LACTULOSE 10 GM/15ML PO SOLN
10.0000 g | Freq: Once | ORAL | Status: AC
  Administered 2015-06-11: 10 g via ORAL
  Filled 2015-06-11: qty 30

## 2015-06-11 NOTE — Discharge Instructions (Signed)
Patient has elevated ammonia same as it was last time he was here. It is 104. These make sure the patient is taking his lactulose. We will gave him PO lactulose here. I understand that he received rectal lactulose prior to arrival. Please use either oral or rectal as needed.

## 2015-06-11 NOTE — ED Provider Notes (Signed)
CSN: [id number]     Arrival date & time 06/11/15  1804 History   First MD Initiated Contact with Patient 06/11/15 1813     Chief Complaint  Patient presents with  . Abnormal Lab     (Consider location/radiation/quality/duration/timing/severity/associated sxs/prior Treatment) HPI  Level V caveat traumatic brain injury , altered mental status.  Patient is a 59 year old male who resides at a nursing facility after having a TBI. Patient is nonverbal at baseline. Nursing home sent him here because they suspected an increased ammonia. We are unsure how they suspected this given that he is nonverbal at baseline. Patient has a small hematoma noted on his left forehead. Patient's been here multiple times for altered mental status in the past. Normally patient is not able to stay still for long enough to get a CAT scan and often the feeling is that the risk of sedation outweighs the benefit.    Past Medical History  Diagnosis Date  . Hepatitis C     viral load in 12/2010 was >1,000,000  . Cirrhosis ([place]) 2006  . Alcohol abuse     chronic  . Portal vein thrombosis 2012  . Thrombocytopenia ([place])   . Pancytopenia ([place])   . Physiological tremor   . 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 Multiple trauma 03/2014    rib, clavicle,orbital fractures.  pneumothorax and possible hemothorax.   Past Surgical History  Procedure Laterality Date  . Orif clavicular fracture Left 04/27/2014    Procedure: OPEN REDUCTION INTERNAL FIXATION (ORIF) LEFT CLAVICULAR FRACTURE;  Surgeon: Johnny Bridge, MD;  Location: [place];  Service: Orthopedics;  Laterality: Left;  . Esophagogastroduodenoscopy  12/2010    dr Benson [place].  found small fundal varix.    Family History  Problem Relation Age of Onset  . Cancer Mother     type unknown  . Alcohol abuse Father   . Alcohol abuse Brother    Social History  Substance Use Topics  . Smoking status: Current Every Day Smoker --  0.50 packs/day    Types: Cigarettes  . Smokeless tobacco: None  . Alcohol Use: 5.4 oz/week    4 Cans of beer, 5 Shots of liquor per week     Comment: drinks liqour and beer daily    Review of Systems  Unable to perform ROS: Mental status change      Allergies  Review of patient's allergies indicates no known allergies.  Home Medications   Prior to Admission medications   Medication Sig Start Date End Date Taking? Authorizing Provider  acetaminophen (TYLENOL) 325 MG tablet Take 1-2 tablets (325-650 mg total) by mouth every 4 (four) hours as needed for mild pain. 05/22/14   Bary Leriche, PA-C  carbamazepine (TEGRETOL) 200 MG tablet Take 1 tablet (200 mg total) by mouth 2 (two) times daily. 07/18/14   Jeremiyah Rumpf York, PA-C  chlorhexidine (PERIDEX) 0.12 % solution 15 mLs by Mouth Rinse route 2 (two) times daily. 05/22/14   Bary Leriche, PA-C  folic acid (FOLVITE) 1 MG tablet Take 1 tablet (1 mg total) by mouth daily. 05/22/14   Ivan Anchors Love, PA-C  ipratropium-albuterol (DUONEB) 0.5-2.5 (3) MG/3ML SOLN Take 3 mLs by nebulization every 6 (six) hours as needed. 05/22/14   Bary Leriche, PA-C  lactulose (CHRONULAC) 10 GM/15ML SOLN enema Place 300 mLs rectally 3 times/day as needed-between meals & bedtime (Hepatic encephalopathy treatment and prevention.). 07/18/14  Leeum Rumpf York, PA-C  lactulose (CHRONULAC) 10 GM/15ML solution Take 67.5 mLs (45 g total) by mouth 4 (four) times daily -  with meals and at bedtime. 05/22/14   Ivan Anchors Love, PA-C  LORazepam (ATIVAN) 1 MG tablet Take 1 tablet (1 mg total) by mouth every 6 (six) hours as needed for anxiety. 07/18/14   Melton Alar, PA-C  Multiple Vitamin (MULTIVITAMIN WITH MINERALS) TABS tablet Take 1 tablet by mouth daily. 05/22/14   Bary Leriche, PA-C  omeprazole (PRILOSEC) 20 MG capsule Take 20 mg by mouth daily.    Historical Provider, MD  polyethylene glycol (MIRALAX / GLYCOLAX) packet Take 17 g by mouth daily. 05/22/14   Bary Leriche, PA-C   QUEtiapine (SEROQUEL) 25 MG tablet Take 1 tablet at bedtime for sleep 07/18/14   Melton Alar, PA-C  rifaximin (XIFAXAN) 550 MG TABS tablet Take 1 tablet (550 mg total) by mouth 2 (two) times daily. 07/18/14   Melton Alar, PA-C  thiamine 100 MG tablet Take 1 tablet (100 mg total) by mouth daily. 05/22/14   Ivan Anchors Love, PA-C   BP 153/82 mmHg  Pulse 73  Temp(Src) 97.5 F (36.4 C) (Oral)  Resp 18  Ht 5\' 9"  (1.753 m)  Wt 170 lb (77.111 kg)  BMI 25.09 kg/m2  SpO2 100% Physical Exam  Constitutional: He appears well-nourished.  HENT:  Head: Normocephalic.  Mouth/Throat: Oropharynx is clear and moist.  Small 2 cm hematoma to left forehead  Eyes: Conjunctivae are normal.  Neck: No tracheal deviation present.  Cardiovascular: Normal rate.   Pulmonary/Chest: Effort normal. No stridor. No respiratory distress.  Abdominal: Soft. There is no tenderness. There is no guarding.  Musculoskeletal: Normal range of motion. He exhibits no edema.  Swelling to hands and feet.  Neurological:  Patient nonverbal at baseline.  Skin: Skin is warm and dry. No rash noted. He is not diaphoretic.  Nursing note and vitals reviewed.   ED Course  Procedures (including critical care time) Labs Review Labs Reviewed  CBC WITH DIFFERENTIAL/PLATELET  COMPREHENSIVE METABOLIC PANEL  AMMONIA    Imaging Review No results found. I have personally reviewed and evaluated these images and lab results as part of my medical decision-making.   EKG Interpretation None      MDM   Final diagnoses:  None    Patient is a 59 year old male with history of traumatic brain injury and hepatic encephalopathy, cirrhosis, thrombocytopenia presenting today with "altered mental status". We are unsure how the nursing home feels that he is altered given that he is nonverbal at baseline. However they try to draw an ammonia and could not so they sent him here to the emergency room. Patient has small hematoma to the left  forehead. We will attempt to get a CT, however do not believe that the sedation risks are worth the benefits so will not be sedating him in order to get this given that his cranial nerves appear intact.  We will try to draw ammonia here. Otherwise the patient appears to have a fairly normal physical exam normal vital signs.  7:03 PM Discussed patient with the [facility] where he stays. Reportedly he was somnolent earlier today and they thought he had a high ammonia they gave him rectal lactulose and he improved greatly. However the order was still in the computer and so now on third shift they weren't able to draw so they sent him here to the emergency department to have an ammonia drawn.  However patient is awake and alert and very responsive to any external stimuli. Patient is yelling out and feels very uncomfortable in this environment.  After talking to [facility] it sounds as if patient is really at his baseline.   8:55 PM Patient has elevated ammonia level. It is similar to it was last time he was here. We will encourage the [facility] to give him lactulose as prescribed which is multiple times per day in both enema and PO.   Masaki Rothbauer Julio Alm, MD 06/11/15 2056

## 2015-06-11 NOTE — ED Notes (Signed)
Per EMS, pt from [facility]. States they are suspicious of increasing ammonia levels, but were unsuccessful in drawing labs. Pt was found on floor by nursing staff at facility. Pt has hematoma to LT forehead. Pt alert at this time. Pt nonverbal at baseline. Pt hx of cirrhosis.

## 2016-06-09 ENCOUNTER — Encounter: Payer: Self-pay | Admitting: *Deleted

## 2016-07-01 ENCOUNTER — Encounter (HOSPITAL_COMMUNITY): Payer: Self-pay | Admitting: Hematology

## 2016-07-01 ENCOUNTER — Encounter (HOSPITAL_COMMUNITY): Payer: Medicaid Other | Attending: Hematology | Admitting: Hematology

## 2016-07-01 DIAGNOSIS — K7290 Hepatic failure, unspecified without coma: Secondary | ICD-10-CM

## 2016-07-01 DIAGNOSIS — K7460 Unspecified cirrhosis of liver: Secondary | ICD-10-CM

## 2016-07-01 DIAGNOSIS — E871 Hypo-osmolality and hyponatremia: Secondary | ICD-10-CM | POA: Diagnosis not present

## 2016-07-01 DIAGNOSIS — E7220 Disorder of urea cycle metabolism, unspecified: Secondary | ICD-10-CM

## 2016-07-01 DIAGNOSIS — F17210 Nicotine dependence, cigarettes, uncomplicated: Secondary | ICD-10-CM | POA: Diagnosis not present

## 2016-07-01 DIAGNOSIS — C220 Liver cell carcinoma: Secondary | ICD-10-CM

## 2016-07-01 DIAGNOSIS — Z87820 Personal history of traumatic brain injury: Secondary | ICD-10-CM

## 2016-07-01 DIAGNOSIS — G40909 Epilepsy, unspecified, not intractable, without status epilepticus: Secondary | ICD-10-CM | POA: Diagnosis not present

## 2016-07-01 DIAGNOSIS — Z5189 Encounter for other specified aftercare: Secondary | ICD-10-CM | POA: Insufficient documentation

## 2016-07-01 DIAGNOSIS — Z79899 Other long term (current) drug therapy: Secondary | ICD-10-CM | POA: Diagnosis not present

## 2016-07-01 DIAGNOSIS — B1920 Unspecified viral hepatitis C without hepatic coma: Secondary | ICD-10-CM | POA: Diagnosis not present

## 2016-07-01 LAB — COMPREHENSIVE METABOLIC PANEL
ALBUMIN: 3.4 g/dL — AB (ref 3.5–5.0)
ALK PHOS: 201 U/L — AB (ref 38–126)
ALT: 35 U/L (ref 17–63)
AST: 77 U/L — AB (ref 15–41)
Anion gap: 10 (ref 5–15)
BUN: 7 mg/dL (ref 6–20)
CALCIUM: 8.6 mg/dL — AB (ref 8.9–10.3)
CHLORIDE: 95 mmol/L — AB (ref 101–111)
CO2: 23 mmol/L (ref 22–32)
CREATININE: 0.69 mg/dL (ref 0.61–1.24)
GFR calc Af Amer: >60 mL/min (ref >60)
GFR calc non Af Amer: >60 mL/min (ref >60)
GLUCOSE: 70 mg/dL (ref 65–99)
Potassium: 3.7 mmol/L (ref 3.5–5.1)
SODIUM: 128 mmol/L — AB (ref 135–145)
Total Bilirubin: 0.9 mg/dL (ref 0.3–1.2)
Total Protein: 7.6 g/dL (ref 6.5–8.1)

## 2016-07-01 LAB — CBC WITH DIFFERENTIAL/PLATELET
BASOS ABS: 0 10*3/uL (ref 0.0–0.1)
Basophils Relative: 1 %
EOS ABS: 0.1 10*3/uL (ref 0.0–0.7)
Eosinophils Relative: 2 %
HCT: 41.1 % (ref 39.0–52.0)
HEMOGLOBIN: 14.3 g/dL (ref 13.0–17.0)
LYMPHS PCT: 56 %
Lymphs Abs: 2.2 10*3/uL (ref 0.7–4.0)
MCH: 33.1 pg (ref 26.0–34.0)
MCHC: 34.8 g/dL (ref 30.0–36.0)
MCV: 95.1 fL (ref 78.0–100.0)
MONO ABS: 0.5 10*3/uL (ref 0.1–1.0)
MONOS PCT: 12 %
NEUTROS PCT: 29 %
Neutro Abs: 1.1 10*3/uL — ABNORMAL LOW (ref 1.7–7.7)
PLATELETS: 165 10*3/uL (ref 150–400)
RBC: 4.32 MIL/uL (ref 4.22–5.81)
RDW: 12.8 % (ref 11.5–15.5)
WBC: 3.9 10*3/uL — AB (ref 4.0–10.5)

## 2016-07-01 MED ORDER — HEPARIN SOD (PORK) LOCK FLUSH 100 UNIT/ML IV SOLN
500.0000 [IU] | Freq: Once | INTRAVENOUS | Status: AC
  Administered 2016-07-01: 500 [IU] via INTRAVENOUS

## 2016-07-01 MED ORDER — SODIUM CHLORIDE 0.9% FLUSH
10.0000 mL | INTRAVENOUS | Status: DC | PRN
  Administered 2016-07-01: 10 mL via INTRAVENOUS
  Filled 2016-07-01: qty 10

## 2016-07-01 MED ORDER — HEPARIN SOD (PORK) LOCK FLUSH 100 UNIT/ML IV SOLN
INTRAVENOUS | Status: AC
  Filled 2016-07-01: qty 5
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
## 2016-07-01 NOTE — Patient Instructions (Signed)
[place] at [facility]  Discharge Instructions:  Seen by MD Irene Limbo.  Labs today.  CT chest/abdomen/pelvis within the week.  Appointment with MD Penland in 2 weeks.  _______________________________________________________________  Thank you for choosing [place] at [facility] to provide your oncology and hematology care.  To afford each patient quality time with our providers, please arrive at least 15 minutes before your scheduled appointment.  You need to re-schedule your appointment if you arrive 10 or more minutes late.  We strive to give you quality time with our providers, and arriving late affects you and other patients whose appointments are after yours.  Also, if you no show three or more times for appointments you may be dismissed from the clinic.  Again, thank you for choosing [place] at [place] hope is that these requests will allow you access to exceptional care and in a timely manner. _______________________________________________________________  If you have questions after your visit, please contact our office at [phone] between the hours of 8:30 a.m. and 5:00 p.m. Voicemails left after 4:30 p.m. will not be returned until the following business day. _______________________________________________________________  For prescription refill requests, have your pharmacy contact our office. _______________________________________________________________  Recommendations made by the consultant and any test results will be sent to your referring physician. _______________________________________________________________

## 2016-07-02 LAB — AFP TUMOR MARKER: AFP-Tumor Marker: 18128 ng/mL — ABNORMAL HIGH (ref 0.0–8.3)

## 2016-07-06 ENCOUNTER — Other Ambulatory Visit (HOSPITAL_COMMUNITY): Payer: Self-pay | Admitting: Oncology

## 2016-07-06 ENCOUNTER — Telehealth (HOSPITAL_COMMUNITY): Payer: Self-pay | Admitting: Oncology

## 2016-07-06 DIAGNOSIS — C220 Liver cell carcinoma: Secondary | ICD-10-CM

## 2016-07-06 NOTE — Telephone Encounter (Signed)
CT abd approved after peer to peer discussion: [id number].  Darbie Biancardi, PA-C 07/06/2016 9:53 AM

## 2016-07-09 ENCOUNTER — Ambulatory Visit (HOSPITAL_COMMUNITY)
Admission: RE | Admit: 2016-07-09 | Discharge: 2016-07-09 | Disposition: A | Discharge status: Home / Self Care | Payer: Medicaid Other | Source: Ambulatory Visit | Attending: Hematology | Admitting: Hematology

## 2016-07-09 ENCOUNTER — Ambulatory Visit (HOSPITAL_COMMUNITY): Payer: Medicaid Other

## 2016-07-09 ENCOUNTER — Other Ambulatory Visit (HOSPITAL_COMMUNITY): Payer: Self-pay | Admitting: Oncology

## 2016-07-09 ENCOUNTER — Ambulatory Visit (HOSPITAL_COMMUNITY)
Admission: RE | Admit: 2016-07-09 | Discharge: 2016-07-09 | Disposition: A | Discharge status: Home / Self Care | Payer: Medicaid Other | Source: Ambulatory Visit | Attending: Oncology | Admitting: Oncology

## 2016-07-09 ENCOUNTER — Encounter (HOSPITAL_COMMUNITY): Payer: Self-pay

## 2016-07-09 DIAGNOSIS — I700 Atherosclerosis of aorta: Secondary | ICD-10-CM | POA: Insufficient documentation

## 2016-07-09 DIAGNOSIS — J9811 Atelectasis: Secondary | ICD-10-CM | POA: Diagnosis not present

## 2016-07-09 DIAGNOSIS — R160 Hepatomegaly, not elsewhere classified: Secondary | ICD-10-CM | POA: Diagnosis present

## 2016-07-09 DIAGNOSIS — C220 Liver cell carcinoma: Secondary | ICD-10-CM | POA: Insufficient documentation

## 2016-07-09 DIAGNOSIS — K7460 Unspecified cirrhosis of liver: Secondary | ICD-10-CM | POA: Diagnosis not present

## 2016-07-09 DIAGNOSIS — I2510 Atherosclerotic heart disease of native coronary artery without angina pectoris: Secondary | ICD-10-CM | POA: Insufficient documentation

## 2016-07-09 MED ORDER — IOPAMIDOL (ISOVUE-300) INJECTION 61%
150.0000 mL | Freq: Once | INTRAVENOUS | Status: AC | PRN
  Administered 2016-07-09: 75 mL via INTRAVENOUS

## 2016-07-09 MED ORDER — HEPARIN SOD (PORK) LOCK FLUSH 100 UNIT/ML IV SOLN
INTRAVENOUS | Status: AC
  Administered 2016-07-09: 500 [IU] via INTRAVENOUS
  Filled 2016-07-09: qty 5

## 2016-07-09 MED ORDER — IOPAMIDOL (ISOVUE-300) INJECTION 61%
75.0000 mL | Freq: Once | INTRAVENOUS | Status: AC | PRN
  Administered 2016-07-09: 75 mL via INTRAVENOUS

## 2016-07-16 ENCOUNTER — Ambulatory Visit (HOSPITAL_COMMUNITY): Payer: Self-pay | Admitting: Oncology

## 2016-08-07 ENCOUNTER — Encounter (HOSPITAL_COMMUNITY): Payer: Medicaid Other | Attending: Hematology | Admitting: Oncology

## 2016-08-07 ENCOUNTER — Encounter (HOSPITAL_COMMUNITY): Payer: Self-pay | Admitting: Oncology

## 2016-08-07 DIAGNOSIS — B1920 Unspecified viral hepatitis C without hepatic coma: Secondary | ICD-10-CM

## 2016-08-07 DIAGNOSIS — K7460 Unspecified cirrhosis of liver: Secondary | ICD-10-CM

## 2016-08-07 DIAGNOSIS — Z5189 Encounter for other specified aftercare: Secondary | ICD-10-CM | POA: Insufficient documentation

## 2016-08-07 DIAGNOSIS — F1020 Alcohol dependence, uncomplicated: Secondary | ICD-10-CM | POA: Diagnosis not present

## 2016-08-07 DIAGNOSIS — Z79899 Other long term (current) drug therapy: Secondary | ICD-10-CM | POA: Insufficient documentation

## 2016-08-07 DIAGNOSIS — C220 Liver cell carcinoma: Secondary | ICD-10-CM

## 2016-08-07 DIAGNOSIS — F17210 Nicotine dependence, cigarettes, uncomplicated: Secondary | ICD-10-CM | POA: Insufficient documentation

## 2016-08-07 HISTORY — DX: Liver cell carcinoma: C22.0

## 2016-08-07 NOTE — Patient Instructions (Addendum)
[place] at [facility] Discharge Instructions  RECOMMENDATIONS MADE BY THE CONSULTANT AND ANY TEST RESULTS WILL BE SENT TO YOUR REFERRING PHYSICIAN.  You were seen today by Kirby Crigler PA-C. Follow up with Dr. Wenda Overland.  Call when you are ready for Hospice. Return in 3 months for follow up.    Thank you for choosing [place] at [facility] to provide your oncology and hematology care.  To afford each patient quality time with our provider, please arrive at least 15 minutes before your scheduled appointment time.   Beginning January 23rd 2017 lab work for the [facility] will be done in the  Main lab at [facility] on 1st floor. If you have a lab appointment with the [facility] please come in thru the  Main Entrance and check in at the main information desk  You need to re-schedule your appointment should you arrive 10 or more minutes late.  We strive to give you quality time with our providers, and arriving late affects you and other patients whose appointments are after yours.  Also, if you no show three or more times for appointments you may be dismissed from the clinic at the providers discretion.     Again, thank you for choosing [facility].  Our hope is that these requests will decrease the amount of time that you wait before being seen by our physicians.       _____________________________________________________________  Should you have questions after your visit to [facility], please contact our office at [phone] between the hours of 8:30 a.m. and 4:30 p.m.  Voicemails left after 4:30 p.m. will not be returned until the following business day.  For prescription refill requests, have your pharmacy contact our office.         Resources For Cancer Patients and their Caregivers ? American Cancer Society: Can assist with transportation, wigs, general needs, runs Look Good Feel Better.         304-554-0191 ? Cancer Care: Provides financial assistance, online support groups, medication/co-pay assistance.  1-800-813-HOPE 236-618-1475) ? [facility] Assists [place] cancer patients and their families through emotional , educational and financial support.  2547670687 ? [place] DSS Where to apply for food stamps, Medicaid and utility assistance. 903-811-3684 ? [facility]: Transportation to medical appointments. (618) 345-2568 ? Social Security Administration: May apply for disability if have a Stage IV cancer. 206-046-0420 934 791 8619 ? [employer], Disability and Transit Services: Assists with nutrition, care and transit needs. [place] Support Programs: @10RELATIVEDAYS @ > Cancer Support Group  2nd Tuesday of the month 1pm-2pm, Journey Room  > Creative Journey  3rd Tuesday of the month 1130am-1pm, Journey Room  > Look Good Feel Better  1st Wednesday of the month 10am-12 noon, Journey Room (Call [place] to register [phone])

## 2016-08-07 NOTE — Assessment & Plan Note (Addendum)
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   Patient educated on his diagnosis.  All questions are answered.  He is not a candidate for systemic chemotherapy due to co-morbidities.  I have messaged IR about targeted therapy.  He does have multifocal disease with cirrhosis of liver, untreated Hep C, and ongoing EtOHism.  Abstinence from ETOH is recommended.  Continuation of current medical management is recommended.  Hospice is recommended.  Patient declines at this time.  Patient is quoted ~ 6 month life expectancy.  He certainly could live shorter or longer.  Return in 3 months for follow-up.  He is to call in the interim if he decides to pursue Hospice intervention.  More than 50% of the time spent with the patient was utilized for counseling and coordination of care.  40 minutes is spent in face to face conversation with the patient and 50 minutes is spent on entire encounter.

## 2016-08-07 NOTE — Progress Notes (Signed)
Jose Caprice, DO [place] [address]  Hepatocellular carcinoma [facility])  CURRENT THERAPY: Medical oncology recommendations  INTERVAL HISTORY: Jose Krueger 60 y.o. male returns for followup of multifocal [name] with progression of disease over the past 2 years, in the setting of untreated hepatitis C, cirrhosis of liver, and ongoing EtOHism.     Hepatocellular carcinoma ([place])   05/11/2014 Procedure    Liver biopsy      05/14/2014 Pathology Results    Hepatocellular carcinoma      07/18/2014 Imaging    CT abd/pelvis- 1. Morphologic changes of the liver consistent with cirrhosis. 2. Enhancing lesion the right hepatic lobe is most consistent with hepatocellular carcinoma in patient with cirrhosis. This lesion is enlarged from more remote scan of 2012. 3. No new hepatic enhancing lesions are noted. 4. Evidence of portal hypertension with large gastric varices. These results will be called to the ordering clinician or representative by the Radiologist Assistant, and communication documented in the PACS or zVision Dashboard.      07/23/2014 Miscellaneous    Seen by Jose Krueger (Med Onc) in consultation.  Deemed not a treatment candidates.       07/10/2016 Imaging    CT CA- Multifocal HCC, with discrete lesions measuring up to 5.4 cm.  Cirrhosis. Perigastric varices. Portal vein is diminutive but patent.  No evidence of metastatic disease in the chest.       He is here today to review his recent imaging and medical oncology recommendations.  The patient is poorly educated which hinders his ability to fully grasp the gravity of his malignancy and medical situation.  His girlfriend asks a number of appropriate questions.  They are educated that he is not a candidate for liver transplant for multiple reasons, including multifocal disease and ongoing EtOH abuse.  His risk factors for [place] include untreated hepatitis C, EtOHism, cirrhosis of liver, etc.    He  admits to ongoing EtOH abuse.  Review of Systems  Constitutional: Negative.  Negative for chills and fever.  HENT: Negative.   Eyes: Negative.  Negative for double vision.  Respiratory: Negative.  Negative for cough.   Cardiovascular: Negative.  Negative for chest pain.  Gastrointestinal: Negative.  Negative for abdominal pain.  Genitourinary: Negative.   Musculoskeletal: Negative.   Skin: Negative.   Neurological: Negative.  Negative for weakness.  Endo/Heme/Allergies: Negative.   Psychiatric/Behavioral: Negative.     Past Medical History:  Diagnosis Date  . Alcohol abuse    chronic  . Cirrhosis ([place]) 2006  . Hepatitis C    viral load in 12/2010 was >1,000,000  . Hepatocellular carcinoma ([place]) 08/07/2016  . Liver masses 2012   right and left lobe suspicious for HCCA, AFP level was 6.6  . Multiple trauma 03/2014   rib, clavicle,orbital fractures.  pneumothorax and possible hemothorax.  . Pancytopenia ([place])   . Physiological tremor   . Portal vein thrombosis 2012  . Thrombocytopenia ([place])     Past Surgical History:  Procedure Laterality Date  . ESOPHAGOGASTRODUODENOSCOPY  12/2010   dr Jose Krueger.  found small fundal varix.   Marland Kitchen ORIF CLAVICULAR FRACTURE Left 04/27/2014   Procedure: OPEN REDUCTION INTERNAL FIXATION (ORIF) LEFT CLAVICULAR FRACTURE;  Surgeon: Jose Bridge, MD;  Location: [facility];  Service: Orthopedics;  Laterality: Left;    Family History  Problem Relation Age of Onset  . Cancer Mother     type unknown  . Alcohol abuse Father   .  Alcohol abuse Brother     Social History   Social History  . Marital status: Single    Spouse name: N/A  . Number of children: N/A  . Years of education: N/A   Social History Main Topics  . Smoking status: Current Every Day Smoker    Packs/day: 0.50    Types: Cigarettes  . Smokeless tobacco: Never Used  . Alcohol use 5.4 oz/week    4 Cans of beer, 5 Shots of liquor per week     Comment: drinks liqour and beer daily  . Drug  use: No  . Sexual activity: Yes   Other Topics Concern  . None   Social History Narrative  . None     PHYSICAL EXAMINATION  ECOG PERFORMANCE STATUS: 1 - Symptomatic but completely ambulatory  Vitals:   08/07/16 1148  BP: 132/89  Pulse: 71  Resp: 18  Temp: 98.9 F (37.2 C)    GENERAL:alert, no distress, well nourished, well developed, comfortable, cooperative and accompanied by his long-term girlfirend SKIN: skin color, texture, turgor are normal, no rashes or significant lesions HEAD: Normocephalic EYES: normal, EOMI, sclera clear EARS: External ears normal OROPHARYNX:mucous membranes are moist  NECK: supple, trachea midline LYMPH:  not examined BREAST:not examined LUNGS: not examined HEART: not examined ABDOMEN:not examined BACK: Back symmetric, no curvature. EXTREMITIES:less then 2 second capillary refill, no skin discoloration, no cyanosis  NEURO: alert & oriented x 3 with fluent speech, no focal motor/sensory deficits, gait normal   LABORATORY DATA: CBC    Component Value Date/Time   WBC 3.9 (L) 07/01/2016 1547   RBC 4.32 07/01/2016 1547   HGB 14.3 07/01/2016 1547   HCT 41.1 07/01/2016 1547   PLT 165 07/01/2016 1547   MCV 95.1 07/01/2016 1547   MCH 33.1 07/01/2016 1547   MCHC 34.8 07/01/2016 1547   RDW 12.8 07/01/2016 1547   LYMPHSABS 2.2 07/01/2016 1547   MONOABS 0.5 07/01/2016 1547   EOSABS 0.1 07/01/2016 1547   BASOSABS 0.0 07/01/2016 1547      Chemistry      Component Value Date/Time   NA 128 (L) 07/01/2016 1547   K 3.7 07/01/2016 1547   CL 95 (L) 07/01/2016 1547   CO2 23 07/01/2016 1547   BUN 7 07/01/2016 1547   CREATININE 0.69 07/01/2016 1547      Component Value Date/Time   CALCIUM 8.6 (L) 07/01/2016 1547   ALKPHOS 201 (H) 07/01/2016 1547   AST 77 (H) 07/01/2016 1547   ALT 35 07/01/2016 1547   BILITOT 0.9 07/01/2016 1547        PENDING LABS:   RADIOGRAPHIC STUDIES:  Ct Chest W Contrast  Result Date: 07/10/2016 CLINICAL  DATA:  Newly diagnosed [place], cirrhosis EXAM: CT CHEST WITH CONTRAST CT ABDOMEN WITHOUT AND WITH CONTRAST TECHNIQUE: Multidetector CT imaging of the abdomen was performed without intravenous contrast. Multidetector CT imaging of the chest and abdomen was then performed during bolus administration of intravenous contrast. CONTRAST:  82mL ISOVUE-300 IOPAMIDOL (ISOVUE-300) INJECTION 61% COMPARISON:  CT abdomen dated 04/21/2016 FINDINGS: CT CHEST WITH CONTRAST Cardiovascular: Heart is normal in size.  No pericardial effusion. Coronary atherosclerosis the LAD. Atherosclerotic calcifications of the aortic arch. Right chest port terminates at the cavoatrial junction. Mediastinum/Nodes: No suspicious mediastinal lymphadenopathy. Visualized thyroid is unremarkable. Lungs/Pleura: Mild scarring/ atelectasis in the medial right middle lobe, lingula, and left lower lobe. No focal consolidation. Underlying mild centrilobular emphysematous changes. No suspicious pulmonary nodules. No pleural effusion or pneumothorax. Musculoskeletal: Old left posterolateral  rib fracture deformities. Very mild degenerative changes of the lower thoracic spine. CT ABDOMEN WITHOUT AND WITH CONTRAST Motion degraded images. Hepatobiliary: Cirrhosis. Numerous enhancing foci throughout both lobes, poorly evaluated due to motion degradation, but corresponding to known multifocal HCC. Dominant lesions include: --4.8 cm lesion in segment 7 (series 4/image 17) --5.4 cm partially necrotic lesion in segment 8 (series 4/image 16) --4.4 cm partially necrotic lesion in segment 4A (series 4/image 9) Gallbladder is underdistended but unremarkable. No intrahepatic or extrahepatic ductal dilatation. Pancreas: Within normal limits. Spleen: Within normal limits. Adrenals/Urinary Tract: Adrenal glands are within normal limits. Kidneys are within normal limits.  No hydronephrosis. Stomach/Bowel: Stomach is within normal limits. Visualized bowel is unremarkable.  Vascular/Lymphatic: No evidence abdominal aortic aneurysm. Perigastric varices.  Diminutive portal vein, patent. Other: No abdominal ascites. Musculoskeletal: Mild degenerative changes of the visualized lumbar spine. IMPRESSION: Motion degraded images, markedly limiting evaluation of the liver. Multifocal HCC, with discrete lesions measuring up to 5.4 cm, as described above. Cirrhosis. Perigastric varices. Portal vein is diminutive but patent. No evidence of metastatic disease in the chest. Please note that the pelvis was not imaged. Electronically Signed   By: Julian Hy M.D.   On: 07/10/2016 09:33   Ct Abdomen W Wo Contrast  Result Date: 07/10/2016 CLINICAL DATA:  Newly diagnosed [place], cirrhosis EXAM: CT CHEST WITH CONTRAST CT ABDOMEN WITHOUT AND WITH CONTRAST TECHNIQUE: Multidetector CT imaging of the abdomen was performed without intravenous contrast. Multidetector CT imaging of the chest and abdomen was then performed during bolus administration of intravenous contrast. CONTRAST:  33mL ISOVUE-300 IOPAMIDOL (ISOVUE-300) INJECTION 61% COMPARISON:  CT abdomen dated 04/21/2016 FINDINGS: CT CHEST WITH CONTRAST Cardiovascular: Heart is normal in size.  No pericardial effusion. Coronary atherosclerosis the LAD. Atherosclerotic calcifications of the aortic arch. Right chest port terminates at the cavoatrial junction. Mediastinum/Nodes: No suspicious mediastinal lymphadenopathy. Visualized thyroid is unremarkable. Lungs/Pleura: Mild scarring/ atelectasis in the medial right middle lobe, lingula, and left lower lobe. No focal consolidation. Underlying mild centrilobular emphysematous changes. No suspicious pulmonary nodules. No pleural effusion or pneumothorax. Musculoskeletal: Old left posterolateral rib fracture deformities. Very mild degenerative changes of the lower thoracic spine. CT ABDOMEN WITHOUT AND WITH CONTRAST Motion degraded images. Hepatobiliary: Cirrhosis. Numerous enhancing foci throughout both  lobes, poorly evaluated due to motion degradation, but corresponding to known multifocal HCC. Dominant lesions include: --4.8 cm lesion in segment 7 (series 4/image 17) --5.4 cm partially necrotic lesion in segment 8 (series 4/image 16) --4.4 cm partially necrotic lesion in segment 4A (series 4/image 9) Gallbladder is underdistended but unremarkable. No intrahepatic or extrahepatic ductal dilatation. Pancreas: Within normal limits. Spleen: Within normal limits. Adrenals/Urinary Tract: Adrenal glands are within normal limits. Kidneys are within normal limits.  No hydronephrosis. Stomach/Bowel: Stomach is within normal limits. Visualized bowel is unremarkable. Vascular/Lymphatic: No evidence abdominal aortic aneurysm. Perigastric varices.  Diminutive portal vein, patent. Other: No abdominal ascites. Musculoskeletal: Mild degenerative changes of the visualized lumbar spine. IMPRESSION: Motion degraded images, markedly limiting evaluation of the liver. Multifocal HCC, with discrete lesions measuring up to 5.4 cm, as described above. Cirrhosis. Perigastric varices. Portal vein is diminutive but patent. No evidence of metastatic disease in the chest. Please note that the pelvis was not imaged. Electronically Signed   By: Julian Hy M.D.   On: 07/10/2016 09:33     PATHOLOGY:    ASSESSMENT AND PLAN:  Hepatocellular carcinoma ([place]) Multifocal HCC with progression of disease over the past 2 years, in the setting of untreated hepatitis  C, cirrhosis of liver, and ongoing EtOHism.   Patient educated on his diagnosis.  All questions are answered.  He is not a candidate for systemic chemotherapy due to co-morbidities.  I have messaged IR about targeted therapy.  He does have multifocal disease with cirrhosis of liver, untreated Hep C, and ongoing EtOHism.  Abstinence from ETOH is recommended.  Continuation of current medical management is recommended.  Hospice is recommended.  Patient declines at this  time.  Patient is quoted ~ 6 month life expectancy.  He certainly could live shorter or longer.  Return in 3 months for follow-up.  He is to call in the interim if he decides to pursue Hospice intervention.  More than 50% of the time spent with the patient was utilized for counseling and coordination of care.  40 minutes is spent in face to face conversation with the patient and 50 minutes is spent on entire encounter.   ORDERS PLACED FOR THIS ENCOUNTER: No orders of the defined types were placed in this encounter.   MEDICATIONS PRESCRIBED THIS ENCOUNTER: Meds ordered this encounter  Medications  . loratadine (CLARITIN) 10 MG tablet    Sig: Take 10 mg by mouth daily.    Refill:  11    THERAPY PLAN:  Recommend Hospice.  Comfort/pallitiave care only.  All questions were answered. The patient knows to call the clinic with any problems, questions or concerns. We can certainly see the patient much sooner if necessary.  Patient and plan discussed with Dr. Ancil Linsey and she is in agreement with the aforementioned.   This note is electronically signed by: Doy Mince 08/07/2016 4:22 PM

## 2016-11-12 NOTE — Progress Notes (Signed)
Renata Caprice, DO [place] [address]  Hepatocellular carcinoma [facility])  CURRENT THERAPY: Medical oncology recommendations  INTERVAL HISTORY: Jose Krueger 61 y.o. male returns for followup of multifocal [place] with progression of disease over the past 2 years, in the setting of untreated hepatitis C, cirrhosis of liver, and ongoing EtOHism.     Hepatocellular carcinoma ([place])   05/11/2014 Procedure    Liver biopsy      05/14/2014 Pathology Results    Hepatocellular carcinoma      07/18/2014 Imaging    CT abd/pelvis- 1. Morphologic changes of the liver consistent with cirrhosis. 2. Enhancing lesion the right hepatic lobe is most consistent with hepatocellular carcinoma in patient with cirrhosis. This lesion is enlarged from more remote scan of 2012. 3. No new hepatic enhancing lesions are noted. 4. Evidence of portal hypertension with large gastric varices. These results will be called to the ordering clinician or representative by the Radiologist Assistant, and communication documented in the PACS or zVision Dashboard.      07/23/2014 Miscellaneous    Seen by Truitt Merle (Med Onc) in consultation.  Deemed not a treatment candidates.       07/10/2016 Imaging    CT CA- Multifocal HCC, with discrete lesions measuring up to 5.4 cm.  Cirrhosis. Perigastric varices. Portal vein is diminutive but patent.  No evidence of metastatic disease in the chest.      He reports a back pain that he is seeing a chiropractor for.  He reports that he has some degenerative disc disease and this is the cause of his back pain.  He continues to abuse alcohol.  He has no intentions of alcohol cessation at this time.  He notes that when he drinks alcohol, he is more fatigued and tired than in the past.  Patient's wife continues to ask about liver transplant and she is advised that the patient does not meet transplant criteria for multiple reasons including ongoing alcohol abuse,  active hepatitis C, multifocal hepatocellular carcinoma, cirrhosis of liver, etc.    He is interested in having hospice intervention at this time.  Referral will be made.  Review of Systems  Constitutional: Negative.  Negative for chills, fever and weight loss.  HENT: Negative.   Eyes: Negative.   Respiratory: Negative.  Negative for cough.   Cardiovascular: Negative.  Negative for chest pain.  Gastrointestinal: Negative.  Negative for blood in stool, constipation, diarrhea, melena, nausea and vomiting.  Genitourinary: Negative.   Musculoskeletal: Positive for back pain.  Skin: Negative.   Neurological: Negative.  Negative for weakness.  Endo/Heme/Allergies: Negative.   Psychiatric/Behavioral: Positive for substance abuse (EtOHism).    Past Medical History:  Diagnosis Date  . Alcohol abuse    chronic  . Cirrhosis ([place]) 2006  . Hepatitis C    viral load in 12/2010 was >1,000,000  . Hepatocellular carcinoma ([place]) 08/07/2016  . Liver masses 2012   right and left lobe suspicious for HCCA, AFP level was 6.6  . Multiple trauma 03/2014   rib, clavicle,orbital fractures.  pneumothorax and possible hemothorax.  . Pancytopenia ([place])   . Physiological tremor   . Portal vein thrombosis 2012  . Thrombocytopenia ([place])     Past Surgical History:  Procedure Laterality Date  . ESOPHAGOGASTRODUODENOSCOPY  12/2010   dr Benson [place].  found small fundal varix.   Marland Kitchen ORIF CLAVICULAR FRACTURE Left 04/27/2014   Procedure: OPEN REDUCTION INTERNAL FIXATION (ORIF) LEFT CLAVICULAR FRACTURE;  Surgeon: Johnny Bridge, MD;  Location: [employer];  Service: Orthopedics;  Laterality: Left;    Family History  Problem Relation Age of Onset  . Cancer Mother     type unknown  . Alcohol abuse Father   . Alcohol abuse Brother     Social History   Social History  . Marital status: Single    Spouse name: N/A  . Number of children: N/A  . Years of education: N/A   Social History Main Topics  . Smoking status:  Current Every Day Smoker    Packs/day: 0.50    Types: Cigarettes  . Smokeless tobacco: Never Used  . Alcohol use 5.4 oz/week    4 Cans of beer, 5 Shots of liquor per week     Comment: drinks liqour and beer daily  . Drug use: No  . Sexual activity: Yes   Other Topics Concern  . Not on file   Social History Narrative  . No narrative on file     PHYSICAL EXAMINATION  ECOG PERFORMANCE STATUS: 1 - Symptomatic but completely ambulatory  Vitals:   11/13/16 1409  BP: 138/85  Pulse: 70  Resp: 18  Temp: 98.4 F (36.9 C)    GENERAL:alert, no distress, well nourished, well developed, comfortable, cooperative and accompanied by his long-term girlfirend SKIN: skin color, texture, turgor are normal, no rashes or significant lesions HEAD: Normocephalic EYES: normal, EOMI, sclera clear EARS: External ears normal OROPHARYNX:mucous membranes are moist  NECK: supple, trachea midline LYMPH:  not examined BREAST:not examined LUNGS: not examined HEART: not examined ABDOMEN:not examined BACK: Back symmetric, no curvature. EXTREMITIES:less then 2 second capillary refill, no skin discoloration, no cyanosis  NEURO: alert & oriented x 3 with fluent speech, no focal motor/sensory deficits, gait normal   LABORATORY DATA: CBC    Component Value Date/Time   WBC 3.9 (L) 07/01/2016 1547   RBC 4.32 07/01/2016 1547   HGB 14.3 07/01/2016 1547   HCT 41.1 07/01/2016 1547   PLT 165 07/01/2016 1547   MCV 95.1 07/01/2016 1547   MCH 33.1 07/01/2016 1547   MCHC 34.8 07/01/2016 1547   RDW 12.8 07/01/2016 1547   LYMPHSABS 2.2 07/01/2016 1547   MONOABS 0.5 07/01/2016 1547   EOSABS 0.1 07/01/2016 1547   BASOSABS 0.0 07/01/2016 1547      Chemistry      Component Value Date/Time   NA 128 (L) 07/01/2016 1547   K 3.7 07/01/2016 1547   CL 95 (L) 07/01/2016 1547   CO2 23 07/01/2016 1547   BUN 7 07/01/2016 1547   CREATININE 0.69 07/01/2016 1547      Component Value Date/Time   CALCIUM 8.6 (L)  07/01/2016 1547   ALKPHOS 201 (H) 07/01/2016 1547   AST 77 (H) 07/01/2016 1547   ALT 35 07/01/2016 1547   BILITOT 0.9 07/01/2016 1547        PENDING LABS:   RADIOGRAPHIC STUDIES:  No results found.   PATHOLOGY:    ASSESSMENT AND PLAN:  Hepatocellular carcinoma ([place]) Multifocal HCC with progression of disease over the past 2 years, in the setting of untreated hepatitis C, cirrhosis of liver, and ongoing EtOHism.  He is not a candidate for systemic chemotherapy due to co-morbidities and ongoing EtOHism.  Patient educated on his diagnosis.  All questions are answered.  Abstinence from ETOH is recommended, but he plans on continuing EtOH use.  Continuation of current medical management is recommended.  He is seeing a chiropractor for back pain.  Hospice  is recommended and he is agreeable to this intervention at this time.  Order and referral is completed for [facility].  Patient is quoted ~ 6 month life expectancy.  He certainly could live shorter or longer.  Return in 3-4 months for follow-up.    ORDERS PLACED FOR THIS ENCOUNTER: No orders of the defined types were placed in this encounter.   MEDICATIONS PRESCRIBED THIS ENCOUNTER: Meds ordered this encounter  Medications  . sildenafil (VIAGRA) 100 MG tablet    Sig: Take 100 mg by mouth daily.    Refill:  5    THERAPY PLAN:  Referral to [facility].  All questions were answered. The patient knows to call the clinic with any problems, questions or concerns. We can certainly see the patient much sooner if necessary.  Patient and plan discussed with Dr. Twana First and she is in agreement with the aforementioned.   This note is electronically signed by: Doy Mince 11/13/2016 2:58 PM

## 2016-11-12 NOTE — Assessment & Plan Note (Addendum)
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  He is not a candidate for systemic chemotherapy due to co-morbidities and ongoing EtOHism.  Patient educated on his diagnosis.  All questions are answered.  Abstinence from ETOH is recommended, but he plans on continuing EtOH use.  Continuation of current medical management is recommended.  He is seeing a chiropractor for back pain.  Hospice is recommended and he is agreeable to this intervention at this time.  Order and referral is completed for [facility].  Patient is quoted ~ 6 month life expectancy.  He certainly could live shorter or longer.  Return in 3-4 months for follow-up.

## 2016-11-13 ENCOUNTER — Encounter (HOSPITAL_COMMUNITY): Payer: Medicaid Other | Attending: Hematology | Admitting: Oncology

## 2016-11-13 DIAGNOSIS — Z5189 Encounter for other specified aftercare: Secondary | ICD-10-CM | POA: Insufficient documentation

## 2016-11-13 DIAGNOSIS — F17210 Nicotine dependence, cigarettes, uncomplicated: Secondary | ICD-10-CM | POA: Insufficient documentation

## 2016-11-13 DIAGNOSIS — F1020 Alcohol dependence, uncomplicated: Secondary | ICD-10-CM | POA: Diagnosis not present

## 2016-11-13 DIAGNOSIS — C220 Liver cell carcinoma: Secondary | ICD-10-CM | POA: Insufficient documentation

## 2016-11-13 DIAGNOSIS — Z79899 Other long term (current) drug therapy: Secondary | ICD-10-CM | POA: Insufficient documentation

## 2016-11-13 NOTE — Patient Instructions (Addendum)
[place] at [facility] Discharge Instructions  RECOMMENDATIONS MADE BY THE CONSULTANT AND ANY TEST RESULTS WILL BE SENT TO YOUR REFERRING PHYSICIAN.  We will refer you to hospice, they will be in contact with you Follow up in 3-4 months  Thank you for choosing [place] at [facility] to provide your oncology and hematology care.  To afford each patient quality time with our provider, please arrive at least 15 minutes before your scheduled appointment time.    If you have a lab appointment with the [facility] please come in thru the  Main Entrance and check in at the main information desk  You need to re-schedule your appointment should you arrive 10 or more minutes late.  We strive to give you quality time with our providers, and arriving late affects you and other patients whose appointments are after yours.  Also, if you no show three or more times for appointments you may be dismissed from the clinic at the providers discretion.     Again, thank you for choosing [facility].  Our hope is that these requests will decrease the amount of time that you wait before being seen by our physicians.       _____________________________________________________________  Should you have questions after your visit to [facility], please contact our office at [phone] between the hours of 8:30 a.m. and 4:30 p.m.  Voicemails left after 4:30 p.m. will not be returned until the following business day.  For prescription refill requests, have your pharmacy contact our office.       Resources For Cancer Patients and their Caregivers ? American Cancer Society: Can assist with transportation, wigs, general needs, runs Look Good Feel Better.        516-388-2218 ? Cancer Care: Provides financial assistance, online support groups, medication/co-pay assistance.  1-800-813-HOPE 401-440-8749) ? [place] Assists [place] cancer patients and their families through emotional , educational and financial support.  (310) 114-4062 ? [place] DSS Where to apply for food stamps, Medicaid and utility assistance. 539-632-3371 ? [facility]: Transportation to medical appointments. (269)831-2837 ? Social Security Administration: May apply for disability if have a Stage IV cancer. (781) 451-9753 (709) 669-6411 ? [employer], Disability and Transit Services: Assists with nutrition, care and transit needs. [place] Support Programs: @10RELATIVEDAYS @ > Cancer Support Group  2nd Tuesday of the month 1pm-2pm, Journey Room  > Creative Journey  3rd Tuesday of the month 1130am-1pm, Journey Room  > Look Good Feel Better  1st Wednesday of the month 10am-12 noon, Journey Room (Call [place] to register [phone])

## 2017-01-29 DEATH — deceased

## 2017-02-11 ENCOUNTER — Ambulatory Visit (HOSPITAL_COMMUNITY): Payer: Self-pay
